# Patient Record
Sex: Male | Born: 1960 | Race: White | Hispanic: No | Marital: Married | Smoking: Former smoker
Health system: Southern US, Community
[De-identification: ages and names within clinical notes are randomized; demographics above are authoritative.]

## PROBLEM LIST (undated history)

## (undated) DIAGNOSIS — M47816 Spondylosis without myelopathy or radiculopathy, lumbar region: Secondary | ICD-10-CM

## (undated) DIAGNOSIS — U071 COVID-19: Secondary | ICD-10-CM

## (undated) DIAGNOSIS — E119 Type 2 diabetes mellitus without complications: Secondary | ICD-10-CM

## (undated) DIAGNOSIS — K5790 Diverticulosis of intestine, part unspecified, without perforation or abscess without bleeding: Secondary | ICD-10-CM

## (undated) DIAGNOSIS — IMO0002 Reserved for concepts with insufficient information to code with codable children: Secondary | ICD-10-CM

## (undated) DIAGNOSIS — I10 Essential (primary) hypertension: Secondary | ICD-10-CM

## (undated) HISTORY — DX: Essential (primary) hypertension: I10

## (undated) HISTORY — DX: Diverticulosis of intestine, part unspecified, without perforation or abscess without bleeding: K57.90

## (undated) HISTORY — PX: VASECTOMY: SHX75

---

## 2003-05-14 LAB — HM COLONOSCOPY

## 2007-11-04 HISTORY — PX: HERNIA REPAIR: SHX51

## 2008-05-04 ENCOUNTER — Emergency Department: Payer: Self-pay | Admitting: Emergency Medicine

## 2008-05-07 ENCOUNTER — Emergency Department: Payer: Self-pay | Admitting: Emergency Medicine

## 2009-12-19 ENCOUNTER — Emergency Department: Payer: Self-pay

## 2009-12-20 ENCOUNTER — Emergency Department: Payer: Self-pay | Admitting: Emergency Medicine

## 2011-04-09 ENCOUNTER — Ambulatory Visit: Payer: Self-pay | Admitting: Internal Medicine

## 2011-04-10 LAB — PSA

## 2011-10-13 ENCOUNTER — Emergency Department: Payer: Self-pay | Admitting: Emergency Medicine

## 2011-12-24 ENCOUNTER — Other Ambulatory Visit: Payer: Self-pay | Admitting: Internal Medicine

## 2011-12-24 LAB — COMPREHENSIVE METABOLIC PANEL
Albumin: 3.9 g/dL (ref 3.4–5.0)
Anion Gap: 10 (ref 7–16)
BUN: 18 mg/dL (ref 7–18)
Calcium, Total: 8.9 mg/dL (ref 8.5–10.1)
Chloride: 99 mmol/L (ref 98–107)
Co2: 30 mmol/L (ref 21–32)
Creatinine: 0.99 mg/dL (ref 0.60–1.30)
EGFR (African American): >60
EGFR (Non-African Amer.): >60
Glucose: 152 mg/dL — ABNORMAL HIGH (ref 65–99)
Osmolality: 282 (ref 275–301)
Potassium: 3.7 mmol/L (ref 3.5–5.1)
SGOT(AST): 27 U/L (ref 15–37)
SGPT (ALT): 39 U/L
Sodium: 139 mmol/L (ref 136–145)
Total Protein: 7.5 g/dL (ref 6.4–8.2)

## 2011-12-24 LAB — CBC WITH DIFFERENTIAL/PLATELET
Basophil %: 0.2 %
Eosinophil %: 1.4 %
HGB: 15.2 g/dL (ref 13.0–18.0)
Lymphocyte %: 31 %
MCH: 33 pg (ref 26.0–34.0)
MCHC: 34 g/dL (ref 32.0–36.0)
Monocyte #: 0.4 10*3/uL (ref 0.0–0.7)
Monocyte %: 6.8 %
Neutrophil #: 3.9 10*3/uL (ref 1.4–6.5)
Neutrophil %: 60.6 %
RBC: 4.6 10*6/uL (ref 4.40–5.90)
WBC: 6.4 10*3/uL (ref 3.8–10.6)

## 2011-12-24 LAB — LIPID PANEL
HDL Cholesterol: 30 mg/dL — ABNORMAL LOW (ref 40–60)
Ldl Cholesterol, Calc: 91 mg/dL (ref 0–100)
Triglycerides: 285 mg/dL — ABNORMAL HIGH (ref 0–200)
VLDL Cholesterol, Calc: 57 mg/dL — ABNORMAL HIGH (ref 5–40)

## 2011-12-24 LAB — HEMOGLOBIN A1C: Hemoglobin A1C: 6.9 % — ABNORMAL HIGH (ref 4.2–6.3)

## 2011-12-24 LAB — IRON AND TIBC
Iron Bind.Cap.(Total): 312 ug/dL (ref 250–450)
Iron Saturation: 27 %
Iron: 84 ug/dL (ref 65–175)
Unbound Iron-Bind.Cap.: 228 ug/dL

## 2011-12-24 LAB — FERRITIN: Ferritin (ARMC): 396 ng/mL — ABNORMAL HIGH (ref 8–388)

## 2012-02-07 ENCOUNTER — Inpatient Hospital Stay: Payer: Self-pay | Admitting: Internal Medicine

## 2012-02-07 LAB — CBC WITH DIFFERENTIAL/PLATELET
Basophil #: 0 10*3/uL (ref 0.0–0.1)
HCT: 45.6 % (ref 40.0–52.0)
MCH: 33.6 pg (ref 26.0–34.0)
MCHC: 34.4 g/dL (ref 32.0–36.0)
Monocyte %: 8.7 %
Platelet: 132 10*3/uL — ABNORMAL LOW (ref 150–440)
RDW: 13.7 % (ref 11.5–14.5)
WBC: 5.9 10*3/uL (ref 3.8–10.6)

## 2012-02-07 LAB — BASIC METABOLIC PANEL
Anion Gap: 8 (ref 7–16)
Calcium, Total: 9 mg/dL (ref 8.5–10.1)
Co2: 28 mmol/L (ref 21–32)
EGFR (African American): >60
EGFR (Non-African Amer.): >60
Glucose: 110 mg/dL — ABNORMAL HIGH (ref 65–99)
Osmolality: 280 (ref 275–301)
Potassium: 4.5 mmol/L (ref 3.5–5.1)

## 2012-02-13 LAB — CULTURE, BLOOD (SINGLE)

## 2012-02-14 LAB — CULTURE, BLOOD (SINGLE)

## 2012-03-30 ENCOUNTER — Emergency Department: Payer: Self-pay | Admitting: Emergency Medicine

## 2012-03-30 LAB — CBC
HCT: 45.6 % (ref 40.0–52.0)
HGB: 15.6 g/dL (ref 13.0–18.0)
MCH: 33.2 pg (ref 26.0–34.0)
MCHC: 34.2 g/dL (ref 32.0–36.0)
MCV: 97 fL (ref 80–100)
Platelet: 123 10*3/uL — ABNORMAL LOW (ref 150–440)
RDW: 14.2 % (ref 11.5–14.5)
WBC: 9.4 10*3/uL (ref 3.8–10.6)

## 2012-03-30 LAB — BASIC METABOLIC PANEL
Anion Gap: 9 (ref 7–16)
BUN: 16 mg/dL (ref 7–18)
Calcium, Total: 8.4 mg/dL — ABNORMAL LOW (ref 8.5–10.1)
Chloride: 101 mmol/L (ref 98–107)
Creatinine: 0.97 mg/dL (ref 0.60–1.30)
EGFR (African American): >60
Glucose: 186 mg/dL — ABNORMAL HIGH (ref 65–99)
Osmolality: 278 (ref 275–301)
Sodium: 136 mmol/L (ref 136–145)

## 2012-04-02 ENCOUNTER — Emergency Department: Payer: Self-pay | Admitting: Emergency Medicine

## 2012-05-13 ENCOUNTER — Telehealth: Payer: Self-pay | Admitting: Internal Medicine

## 2012-05-13 ENCOUNTER — Encounter: Payer: Self-pay | Admitting: Internal Medicine

## 2012-05-13 ENCOUNTER — Ambulatory Visit (INDEPENDENT_AMBULATORY_CARE_PROVIDER_SITE_OTHER): Payer: BC Managed Care – PPO | Admitting: Internal Medicine

## 2012-05-13 VITALS — BP 118/80 | HR 69 | Temp 98.1°F | Resp 18 | Ht 70.0 in | Wt 247.5 lb

## 2012-05-13 DIAGNOSIS — K5790 Diverticulosis of intestine, part unspecified, without perforation or abscess without bleeding: Secondary | ICD-10-CM | POA: Insufficient documentation

## 2012-05-13 DIAGNOSIS — G4733 Obstructive sleep apnea (adult) (pediatric): Secondary | ICD-10-CM

## 2012-05-13 DIAGNOSIS — I872 Venous insufficiency (chronic) (peripheral): Secondary | ICD-10-CM

## 2012-05-13 DIAGNOSIS — E669 Obesity, unspecified: Secondary | ICD-10-CM | POA: Insufficient documentation

## 2012-05-13 DIAGNOSIS — I10 Essential (primary) hypertension: Secondary | ICD-10-CM | POA: Insufficient documentation

## 2012-05-13 DIAGNOSIS — I878 Other specified disorders of veins: Secondary | ICD-10-CM

## 2012-05-13 NOTE — Assessment & Plan Note (Signed)
Suspected by exam and history.  Records requested.  Pathophysiology explained and Proper use of compression stockings described.

## 2012-05-13 NOTE — Assessment & Plan Note (Signed)
diagnosed with prior sleep study but treatment has been deferred d by patient.  Discussed the long term history of OSA , the risks of long term damage to heart and the signs and symptoms attributable to OSA.  Advised patient to consider  significant weight loss and/or use of CPAP.  

## 2012-05-13 NOTE — Telephone Encounter (Signed)
Letter has been printed.  Ready for patient to pick up.

## 2012-05-13 NOTE — Patient Instructions (Addendum)
Consider the Low Glycemic Index Diet and 6 smaller meals daily .  This boosts your metabolism and regulates your sugars:   7 AM Low carbohydrate Protein  Shakes (EAS AdvantEdge Carb Control  Or Atkins ,  Available everywhere,   In  cases at BJs )  2.5 carbs  (Add or substitute a toasted sandwhich thin w/ peanut butter) or consider scrambled eggs on a low carb tortilla)  10 AM: Protein bar by Atkins (snack size,  Chocolate lover's variety at  [employer]J's)    Lunch: sandwich on pita bread or flatbread (Joseph's makes a pita bread and a flat bread , available at [employer] and [employer]J's; Toufayah makes a low carb flatbread available at [employer] and [employer]) Mission makes a low carb whole wheat tortilla available at [employer] most grocery stores   3 PM:  Mid day :  Another protein bar,  Or a  cheese stick, 1/4 cup of almonds, walnuts, pistachios, pecans, peanuts,  Macadamia nuts  6 PM  Dinner:  "mean and green:"  Meat/chicken/fish, salad, and green veggie : use ranch, vinagrette,  Blue cheese, etc  9 PM snack : Breyer's low carb fudgsicle or  ice cream bar (Carb Smart), or  Weight Watcher's ice cream bar , or another protein shake  All substitutions should be less than 15 to 20 net carbohydrates per serving. Dannons lite n fit greek yogurt  Is 80 cal / 8 carbs

## 2012-05-13 NOTE — Telephone Encounter (Signed)
Needing a note for work for the time he was in the Dr's office today. He wants to pick it up after 2:00.

## 2012-05-13 NOTE — Assessment & Plan Note (Signed)
I have addressed  BMI and recommended a low glycemic index diet utilizing smaller more frequent meals to increase metabolism.  I have also recommended that patient start exercising with a goal of 30 minutes of aerobic exercise a minimum of 5 days per week. Screening for lipid disorders, thyroid and diabetes to be done today.   

## 2012-05-13 NOTE — Progress Notes (Signed)
Patient ID: Steven Holmes, male   DOB: 1961/10/21, 51 y.o.   MRN: [id number]  Patient Active Problem List  Diagnosis  . HTN (hypertension)  . OSA (obstructive sleep apnea)  . Diverticulosis  . Obesity (BMI 30-39.9)  . Lower extremity venous stasis    Subjective:  CC:   Chief Complaint  Patient presents with  . New Patient    HPI:   Steven Holmes is a 51 y.o. male who presents as a new patient to establish primary care with the chief complaint of  Recurrent RLE swelling and redness accompanied by pain.  Present for 5 or 6 years,   No history of significant trauma to right side.   No history blood clot or groin infection .  Symptoms are persistent but vary in severity with incomplete resolution in between episodes. Not sure what precipitates flares, which occur about once yearly until this years, with two episodes so far.  Treated in ER for cellulitis with antibiotics for MRSA, advised to elevate feet and use compression socks advised  which he has only recently started.   No history of ulcers or boils so no culture done .  Not wearing the stockings during rhe day. Has had prior vascular evaluation per patient ordered by Dr. Alison Murray. Test for sleep apnea was positive one year ago,  But he did not obtain CPAP.  Sleep study was done [facility] , ordered by Dr. Alison Murray.  2012.     Past Medical History  Diagnosis Date  . Hypertension   . Diverticulosis     Past Surgical History  Procedure Date  . Hernia repair 2009    revision left sided inguinal at [facility]  . Vasectomy     Family History  Problem Relation Age of Onset  . Cancer Mother     skin Ca  . Cancer Father     basal cell , nose     History   Social History  . Marital Status: Married    Spouse Name: N/A    Number of Children: N/A  . Years of Education: N/A   Occupational History  . Not on file.   Social History Main Topics  . Smoking status: Former Smoker -- 20 years    Types: Cigarettes    Quit date:  05/13/2010  . Smokeless tobacco: Never Used  . Alcohol Use: No  . Drug Use: No  . Sexually Active: Not on file   Other Topics Concern  . Not on file   Social History Narrative  . No narrative on file     No Known Allergies   Review of Systems:   The remainder of the review of systems was negative except those addressed in the HPI.     Objective:  BP 118/80  Pulse 69  Temp 98.1 F (36.7 C) (Oral)  Resp 18  Ht 5\' 10"  (1.778 m)  Wt 247 lb 8 oz (112.265 kg)  BMI 35.51 kg/m2  SpO2 96%  General appearance: alert, cooperative and appears stated age Ears: normal TM's and external ear canals both ears Throat: lips, mucosa, and tongue normal; teeth and gums normal Neck: no adenopathy, no carotid bruit, supple, symmetrical, trachea midline and thyroid not enlarged, symmetric, no tenderness/mass/nodules Back: symmetric, no curvature. ROM normal. No CVA tenderness. Lungs: clear to auscultation bilaterally Heart: regular rate and rhythm, S1, S2 normal, no murmur, click, rub or gallop Abdomen: soft, non-tender; bowel sounds normal; no masses,  no organomegaly Pulses: 2+ and symmetric Skin: Skin  color, texture, turgor normal. No rashes or lesions Lymph nodes: Cervical, supraclavicular, and axillary nodes normal.  Assessment and Plan:  OSA (obstructive sleep apnea) diagnosed with prior sleep study but treatment has been deferred d by patient.  Discussed the long term history of OSA , the risks of long term damage to heart and the signs and symptoms attributable to OSA.  Advised patient to consider  significant weight loss and/or use of CPAP.   Obesity (BMI 30-39.9) I have addressed  BMI and recommended a low glycemic index diet utilizing smaller more frequent meals to increase metabolism.  I have also recommended that patient start exercising with a goal of 30 minutes of aerobic exercise a minimum of 5 days per week. Screening for lipid disorders, thyroid and diabetes to be done  today.     HTN (hypertension) Well controlled on current regimen. Renal function check needed and ordered..  Lower extremity venous stasis Suspected by exam and history.  Records requested.  Pathophysiology explained and Proper use of compression stockings described.   Updated Medication List Outpatient Encounter Prescriptions as of 05/13/2012  Medication Sig Dispense Refill  . lisinopril-hydrochlorothiazide (PRINZIDE,ZESTORETIC) 10-12.5 MG per tablet Take 1 tablet by mouth daily.      . Multiple Vitamin (MULTIVITAMIN) tablet Take 1 tablet by mouth daily.         Orders Placed This Encounter  Procedures  . HM COLONOSCOPY    No Follow-up on file.

## 2012-05-13 NOTE — Assessment & Plan Note (Signed)
Well controlled on current regimen. Renal function check needed and ordered.Steven Holmes

## 2012-06-11 ENCOUNTER — Emergency Department: Payer: Self-pay | Admitting: *Deleted

## 2012-08-16 ENCOUNTER — Encounter: Payer: Self-pay | Admitting: Internal Medicine

## 2012-08-16 ENCOUNTER — Ambulatory Visit (INDEPENDENT_AMBULATORY_CARE_PROVIDER_SITE_OTHER): Payer: BC Managed Care – PPO | Admitting: Internal Medicine

## 2012-08-16 VITALS — BP 120/62 | HR 68 | Temp 98.2°F | Ht 69.25 in | Wt 249.5 lb

## 2012-08-16 DIAGNOSIS — R05 Cough: Secondary | ICD-10-CM

## 2012-08-16 DIAGNOSIS — E669 Obesity, unspecified: Secondary | ICD-10-CM

## 2012-08-16 DIAGNOSIS — R0600 Dyspnea, unspecified: Secondary | ICD-10-CM

## 2012-08-16 DIAGNOSIS — E119 Type 2 diabetes mellitus without complications: Secondary | ICD-10-CM

## 2012-08-16 DIAGNOSIS — R0989 Other specified symptoms and signs involving the circulatory and respiratory systems: Secondary | ICD-10-CM

## 2012-08-16 MED ORDER — ALBUTEROL SULFATE HFA 108 (90 BASE) MCG/ACT IN AERS: 2.0000 | INHALATION_SPRAY | Freq: Four times a day (QID) | RESPIRATORY_TRACT | Status: DC | PRN

## 2012-08-16 NOTE — Progress Notes (Signed)
Patient ID: Steven Holmes, male   DOB: 07-28-1961, 51 y.o.   MRN: [id number]  Patient Active Problem List  Diagnosis  . HTN (hypertension)  . OSA (obstructive sleep apnea)  . Diverticulosis  . Obesity (BMI 30-39.9)  . Lower extremity venous stasis  . Dyspnea    Subjective:  CC:   Chief Complaint  Patient presents with  . Follow-up    HPI:   Steven Holmes a 51 y.o. male who presents Obesity,  Dyspnea,  LE edema  Having trouble exercising  due to to dyspnea,  Quit smoking in 2002 after 25 pack yr history .  Last PFTS in the 90's done for same reason  As well as occupationla exposure to silica and dust.   Needs repeat along with cxr. Also having cough,  Mixed dry/clear . Wakes up with productive cough.   Has many conditions that have not had follow up including DM and HH.  History of pneumonia in 1994 and in 2000.   No chest  X ray in one year.     Past Medical History  Diagnosis Date  . Hypertension   . Diverticulosis     Past Surgical History  Procedure Date  . Hernia repair 2009    revision left sided inguinal at [facility]  . Vasectomy          The following portions of the patient's history were reviewed and updated as appropriate: Allergies, current medications, and problem list.    Review of Systems:   12 Pt  review of systems was negative except those addressed in the HPI,     History   Social History  . Marital Status: Married    Spouse Name: N/A    Number of Children: N/A  . Years of Education: N/A   Occupational History  . Not on file.   Social History Main Topics  . Smoking status: Former Smoker -- 20 years    Types: Cigarettes    Quit date: 05/13/2010  . Smokeless tobacco: Never Used  . Alcohol Use: No  . Drug Use: No  . Sexually Active: Not on file   Other Topics Concern  . Not on file   Social History Narrative  . No narrative on file    Objective:  BP 120/62  Pulse 68  Temp 98.2 F (36.8 C) (Oral)  Ht 5' 9.25" (1.759 m)   Wt 249 lb 8 oz (113.172 kg)  BMI 36.58 kg/m2  SpO2 94%  General appearance: alert, cooperative and appears stated age Ears: normal TM's and external ear canals both ears Throat: lips, mucosa, and tongue normal; teeth and gums normal Neck: no adenopathy, no carotid bruit, supple, symmetrical, trachea midline and thyroid not enlarged, symmetric, no tenderness/mass/nodules Back: symmetric, no curvature. ROM normal. No CVA tenderness. Lungs: clear to auscultation bilaterally Heart: regular rate and rhythm, S1, S2 normal, no murmur, click, rub or gallop Abdomen: soft, non-tender; bowel sounds normal; no masses,  no organomegaly Pulses: 2+ and symmetric Skin: Skin color, texture, turgor normal. No rashes or lesions Lymph nodes: Cervical, supraclavicular, and axillary nodes normal.  Assessment and Plan:  Dyspnea Likely multifactorial including restrictive lung disease from obesity and occupation exposure to inhaled irritants.   Will need CXR, PFTs and pulmonary evaluation .  Obesity (BMI 30-39.9) I have addressed  BMI and recommended a low glycemic index diet utilizing smaller more frequent meals to increase metabolism.  I have also recommended that patient start exercising with a goal of 30 minutes of  aerobic exercise a minimum of 5 days per week.    Updated Medication List Outpatient Encounter Prescriptions as of 08/16/2012  Medication Sig Dispense Refill  . lisinopril-hydrochlorothiazide (PRINZIDE,ZESTORETIC) 10-12.5 MG per tablet Take 1 tablet by mouth daily.      . Multiple Vitamin (MULTIVITAMIN) tablet Take 1 tablet by mouth daily.      Marland Kitchen albuterol (PROVENTIL HFA;VENTOLIN HFA) 108 (90 BASE) MCG/ACT inhaler Inhale 2 puffs into the lungs every 6 (six) hours as needed for wheezing.  1 Inhaler  0     Orders Placed This Encounter  Procedures  . DG Chest 2 View  . Hemoglobin A1c  . Comprehensive metabolic panel  . LDL cholesterol, direct  . CBC with Differential  . Iron and TIBC  .  Ferritin  . Ambulatory referral to Pulmonology  . Pulmonary function test    Return in about 1 month (around 09/16/2012).

## 2012-08-16 NOTE — Patient Instructions (Addendum)
Referral for chest x ray, pulmonary function testing and pulmonology evaluation  Underway:  We willl call you with the referral appts.  This is  Dr. Norton Blizzard version of a  "Low GI"  Diet:  All of the foods can be found at grocery stores and in bulk at [employer].  The Atkins protein bars and shakes are available in more varieties at [employer], [employer] and [employer].     7 AM Breakfast:  Low carbohydrate Protein  Shakes (I recommend the EAS AdvantEdge "Carb Control" shakes  Or the low carb shakes by Atkins.   Both are available everywhere:  In  cases at BJs  Or in 4 packs at grocery stores and pharmacies  2.5 carbs  (Alternative is  a toasted Arnold's Sandwhich Thin w/ peanut butter, a "Bagel Thin" with cream cheese and salmon) or  a scrambled egg burrito made with a low carb tortilla .  Avoid cereal and bananas, oatmeal too unless you are cooking the old fashioned kind that takes 30-40 minutes to prepare.  the rest is overly processed, has minimal fiber, and is loaded with carbohydrates!   10 AM: Protein bar by Atkins (the snack size, under 200 cal).  There are many varieties , available widely again or in bulk in limited varieties at BJs)  Other so called "protein bars" tend to be loaded with carbohydrates.  Remember, in food advertising, the word "energy" is synonymous for " carbohydrate."  Lunch: sandwich of [place], (or any lunchmeat, grilled meat or canned tuna), fresh avocado, mayonnaise  and cheese on a lower carbohydrate pita bread, flatbread, or tortilla . Ok to use regular mayonnaise. The bread is the only source or carbohydrate that can be decreased (Joseph's makes a pita bread and a flat bread that are 50 cal and 4 net carbs ; Toufayan makes a low carb flatbread that's 100 cal and 9 net carbs  and  Mission makes a low carb whole wheat tortilla  That is 210 cal and 6 net carbs)  3 PM:  Mid day :  Another protein bar,  Or a  cheese stick (100 cal, 0 carbs),  Or 1 ounce of  almonds, walnuts,  pistachios, pecans, peanuts,  Macadamia nuts. Or a Dannon light n Fit greek yogurt, 80 cal 8 net carbs . Avoid "granola"; the dried cranberries and raisins are loaded with carbohydrates. Mixed nuts ok if no raisins or cranberries or dried fruit.      6 PM  Dinner:  "mean and green:"  Meat/chicken/fish or a high protein legume; , with a green salad, and a low GI  Veggie (broccoli, cauliflower, green beans, spinach, brussel sprouts. Lima beans) : Avoid "Low fat dressings, as well as Reyne Dumas and [address]! They are loaded with sugar! Instead use ranch, vinagrette,  Blue cheese, etc  9 PM snack : Breyer's "low carb" fudgsicle or  ice cream bar (Carb Smart line), or  Weight Watcher's ice cream bar , or another "no sugar added" ice cream;a serving of fresh berries/cherries with whipped cream (Avoid bananas, pineapple, grapes  and watermelon on a regular basis because they are high in sugar)   Remember that snack Substitutions should be less than 15 to 20 carbs  Per serving. Remember to subtract fiber grams and sugar alcohols to get the "net carbs."

## 2012-08-17 ENCOUNTER — Encounter: Payer: Self-pay | Admitting: Internal Medicine

## 2012-08-17 DIAGNOSIS — R0600 Dyspnea, unspecified: Secondary | ICD-10-CM | POA: Insufficient documentation

## 2012-08-17 LAB — CBC WITH DIFFERENTIAL/PLATELET
Basophils Relative: 0.2 % (ref 0.0–3.0)
Eosinophils Absolute: 0.1 10*3/uL (ref 0.0–0.7)
HCT: 47 % (ref 39.0–52.0)
Hemoglobin: 15.7 g/dL (ref 13.0–17.0)
Lymphs Abs: 2.1 10*3/uL (ref 0.7–4.0)
MCHC: 33.3 g/dL (ref 30.0–36.0)
MCV: 101 fl — ABNORMAL HIGH (ref 78.0–100.0)
Monocytes Absolute: 0.5 10*3/uL (ref 0.1–1.0)
Neutro Abs: 4.1 10*3/uL (ref 1.4–7.7)
RBC: 4.65 Mil/uL (ref 4.22–5.81)

## 2012-08-17 LAB — COMPREHENSIVE METABOLIC PANEL
ALT: 30 U/L (ref 0–53)
AST: 24 U/L (ref 0–37)
Creatinine, Ser: 0.9 mg/dL (ref 0.4–1.5)
Sodium: 137 mEq/L (ref 135–145)
Total Bilirubin: 0.8 mg/dL (ref 0.3–1.2)

## 2012-08-17 LAB — HEMOGLOBIN A1C: Hgb A1c MFr Bld: 6.6 % — ABNORMAL HIGH (ref 4.6–6.5)

## 2012-08-17 NOTE — Assessment & Plan Note (Signed)
I have addressed  BMI and recommended a low glycemic index diet utilizing smaller more frequent meals to increase metabolism.  I have also recommended that patient start exercising with a goal of 30 minutes of aerobic exercise a minimum of 5 days per week.  

## 2012-08-17 NOTE — Assessment & Plan Note (Signed)
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   Will need CXR, PFTs and pulmonary evaluation .

## 2012-08-19 NOTE — Addendum Note (Signed)
Addended by: Sherlene Shams on: 08/19/2012 06:11 PM   Modules accepted: Orders

## 2012-08-20 ENCOUNTER — Other Ambulatory Visit: Payer: BC Managed Care – PPO

## 2012-08-23 LAB — IRON AND TIBC
Iron Saturation: 27 % (ref 15–55)
Iron: 74 ug/dL (ref 40–155)

## 2012-08-24 ENCOUNTER — Ambulatory Visit: Payer: Self-pay | Admitting: Internal Medicine

## 2012-09-02 ENCOUNTER — Ambulatory Visit (INDEPENDENT_AMBULATORY_CARE_PROVIDER_SITE_OTHER): Payer: BC Managed Care – PPO | Admitting: Pulmonary Disease

## 2012-09-02 ENCOUNTER — Encounter: Payer: Self-pay | Admitting: Pulmonary Disease

## 2012-09-02 VITALS — BP 114/70 | HR 80 | Temp 97.9°F | Ht 70.0 in | Wt 248.0 lb

## 2012-09-02 DIAGNOSIS — R05 Cough: Secondary | ICD-10-CM

## 2012-09-02 DIAGNOSIS — G4733 Obstructive sleep apnea (adult) (pediatric): Secondary | ICD-10-CM

## 2012-09-02 DIAGNOSIS — R0989 Other specified symptoms and signs involving the circulatory and respiratory systems: Secondary | ICD-10-CM

## 2012-09-02 DIAGNOSIS — R059 Cough, unspecified: Secondary | ICD-10-CM

## 2012-09-02 DIAGNOSIS — R0609 Other forms of dyspnea: Secondary | ICD-10-CM

## 2012-09-02 DIAGNOSIS — R0600 Dyspnea, unspecified: Secondary | ICD-10-CM

## 2012-09-02 NOTE — Assessment & Plan Note (Signed)
Steven Holmes has a lengthy history of smoking but has no evidence of COPD on his PFT's.  They showed mild restriction with a low ERV which is either due to poor effort or obesity.  I think that obesity is likely at play here.  We will get a CXR to ensure there is no other evidence of lung disease but I doubt there will be given his normal exam and oximetry today.  Anemia is also not a possibility given his normal Hgb.  His muscle strength is normal on my neuro exam today in clinic.  I think we need to rule out cardiac disease given his age and prior smoking history.  I will order an stress echo so we can also get a look at his heart function.  If that is negative then we will be left with obesity and deconditioning as the most likely etiology of his illness.  Plan: -CXR -stress Echo -if echo negative, start diet and exercise routine; f/u in 3 months if improved

## 2012-09-02 NOTE — Assessment & Plan Note (Signed)
He has a mild intermittent cough which is likely due to sinus congestion as he describes significant post nasal drip especially at night an in the mornings.  His GERD is well controlled and he is on an ACE inhibitor, so if treatment of post nasal drip doesn't work we can investigate these further.  Plan: -saline rinses, nasonex, chlortimeton and OTC decongestants

## 2012-09-02 NOTE — Assessment & Plan Note (Signed)
He describes classic OSA symptoms but did not want to pay for CPAP after a recent positive study.  I explained to him the risks of not treating OSA (A-fib, stroke, hypertension, etc).  He is willing to consider treatment of CPAP if his sleep study showed moderate or severe disease.  Plan: -I'll get the records of his recent polysomnogram and review with him

## 2012-09-02 NOTE — Progress Notes (Signed)
Subjective:    Patient ID: Steven Holmes, male    DOB: 10/09/1961, 51 y.o.   MRN: [id number]  HPI Steven Holmes is a 51 year old male who has a past medical history significant for smoking one pack of cigarettes daily for 25 years who comes to our clinic today for evaluation of shortness of breath. He had a normal childhood without respiratory illnesses but unfortunately started smoking and young adulthood. He quit smoking in 2002.  Several years ago he noticed shortness of breath with exertion while at work. In January of 2013 he had bronchitis and states that since then he is still had intermittent wheezing, shortness of breath, and coughing. This does not occur all the time but he will sometimes experience coughing with mild sputum production in the morning. He says that walking makes him short of breath if walking more than 100 yards or so and he states that climbing flights of stairs will make him short of breath as well. He says that he can make it all the way through grocery store and can carrying groceries without too much difficulty.  He denies chest pain or leg swelling that he has never had a cardiac workup of any kind to his knowledge.  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   He has fairly classic symptoms of obstructive sleep apnea in that he wakes up not feeling well rested and he is often short of breath in the afternoons. He underwent a sleep study which showed signs of obstructive sleep apnea but he did not want to pay for CPAP.   He lives in a house that has a basement and he believes has moisture and mildew problems. He does not notice increasing shortness of breath when he is in his house as opposed to being somewhere else but  sometimes he has worsening runny nose when he is in his kitchen. He does not have significant acid reflux symptoms and he says that he has very rare sinus symptoms.    Past Medical History  Diagnosis Date  . Hypertension   . Diverticulosis      Family History  Problem Relation Age of Onset  . Cancer Mother     skin Ca  . Cancer Father     basal cell , nose      History   Social History  . Marital Status: Married    Spouse Name: N/A    Number of Children: N/A  . Years of Education: N/A   Occupational History  . Not on file.   Social History Main Topics  . Smoking status: Former Smoker -- 1.0 packs/day for 25 years    Types: Cigarettes    Quit date: 11/03/2000  . Smokeless tobacco: Never Used  . Alcohol Use: No  . Drug Use: No  . Sexually Active: Not on file   Other Topics Concern  . Not on file   Social History Narrative  . No narrative on file     No Known Allergies   Outpatient Prescriptions Prior to Visit  Medication Sig Dispense Refill  . albuterol (PROVENTIL HFA;VENTOLIN HFA) 108 (90 BASE) MCG/ACT inhaler Inhale 2 puffs into the lungs every 6 (six) hours as needed for wheezing.  1 Inhaler  0  . lisinopril-hydrochlorothiazide (PRINZIDE,ZESTORETIC) 10-12.5 MG per tablet Take 1 tablet by mouth daily.      . Multiple Vitamin (MULTIVITAMIN) tablet Take 1 tablet by mouth daily.       Last reviewed on 09/02/2012  4:00 PM by Christen Butter, CMA    Review of Systems  Constitutional: Negative for fever, chills, activity change and appetite change.  HENT: Positive for rhinorrhea, sneezing and postnasal drip. Negative for hearing loss, ear pain, congestion, neck pain, neck stiffness and sinus pressure.   Eyes: Negative for redness, itching and visual disturbance.  Respiratory: Positive for cough and shortness of breath. Negative for chest tightness and wheezing.   Cardiovascular: Positive for leg swelling. Negative for chest pain and palpitations.    Gastrointestinal: Negative for nausea, vomiting, abdominal pain, diarrhea, constipation, blood in stool and abdominal distention.  Musculoskeletal: Negative for myalgias, joint swelling, arthralgias and gait problem.  Skin: Negative for rash.  Neurological: Negative for dizziness, light-headedness, numbness and headaches.  Hematological: Does not bruise/bleed easily.  Psychiatric/Behavioral: Negative for confusion and dysphoric mood.       Objective:   Physical Exam  Filed Vitals:   09/02/12 1601  BP: 114/70  Pulse: 80  Temp: 97.9 F (36.6 C)  TempSrc: Oral  Height: 5\' 10"  (1.778 m)  Weight: 248 lb (112.492 kg)  SpO2: 97%   Gen: obese, well appearing, no acute distress HEENT: NCAT, PERRL, EOMi, OP clear, neck supple without masses PULM: CTA B CV: RRR, no mgr, no JVD AB: BS+, soft, nontender, no hsm Ext: warm,some ankle edema bilaterally, no clubbing, no cyanosis Derm: no rash or skin breakdown Neuro: A&Ox4, CN II-XII intact, strength 5/5 in all 4 extremities  08/2012 Full PFT [facility]) normal spiro, mild to moderate restriction with low ERV and normal DLCO     Assessment & Plan:   Dyspnea [name] has a lengthy history of smoking but has no evidence of COPD on his PFT's.  They showed mild restriction with a low ERV which is either due to poor effort or obesity.  I think that obesity is likely at play here.  We will get a CXR to ensure there is no other evidence of lung disease but I doubt there will be given his normal exam and oximetry today.  Anemia is also not a possibility given his normal Hgb.  His muscle strength is normal on my neuro exam today in clinic.  I think we need to rule out cardiac disease given his age and prior smoking history.  I will order an stress echo so we can also get a look at his heart function.  If that is negative then we will be left with obesity and deconditioning as the most likely etiology of his illness.  Plan: -CXR -stress Echo -if  echo negative, start diet and exercise routine; f/u in 3 months if improved  Cough He has a mild intermittent cough which is likely due to sinus congestion as he describes significant post nasal drip especially at night an in the mornings.  His GERD is well controlled and he is on an ACE inhibitor, so if treatment of post nasal drip doesn't work we can investigate these further.  Plan: -saline rinses, nasonex, chlortimeton and OTC decongestants  OSA (obstructive sleep apnea) He describes classic OSA symptoms but did not want to pay for CPAP after a recent positive study.  I explained to him the risks of not treating OSA (A-fib, stroke, hypertension, etc).  He is willing to consider treatment of CPAP if his sleep study showed moderate or severe disease.  Plan: -I'll get the records of his recent polysomnogram and review with him   Updated Medication List Outpatient Encounter Prescriptions as of 09/02/2012  Medication Sig Dispense Refill  . albuterol (PROVENTIL HFA;VENTOLIN HFA) 108 (90 BASE) MCG/ACT inhaler Inhale 2 puffs into the lungs every 6 (six) hours as needed for wheezing.  1 Inhaler  0  . lisinopril-hydrochlorothiazide (PRINZIDE,ZESTORETIC) 10-12.5 MG per tablet Take 1 tablet by mouth daily.      . Multiple Vitamin (MULTIVITAMIN) tablet Take 1 tablet by mouth daily.

## 2012-09-02 NOTE — Patient Instructions (Signed)
We will send you for a Chest x-ray for your cough and call you with the results.  For the cough, we need to focus our care on your sinus congestion:  Use Neil Med rinses with distilled water at least twice per day using the instructions on the package. 1/2 hour after using the [facility] Med rinse, use Nasonex two puffs in each nostril once per day. Use chlortrimeton and an over the counter decongestant (pseudophed or phenylephrine) as needed for the cough.  We will send you for a stress test with Dr. Windell Hummingbird office to evaluate your shortness of breath.  We will call you with the results of these tests.  If they are both negative, then you should start exercising regularly with a goal to walk 35 minutes daily.  Try to lose 20lbs with diet and exercise.  We will see you back in two months or sooner if needed

## 2012-09-07 ENCOUNTER — Encounter: Payer: Self-pay | Admitting: Pulmonary Disease

## 2012-09-08 ENCOUNTER — Telehealth: Payer: Self-pay | Admitting: *Deleted

## 2012-09-08 NOTE — Telephone Encounter (Signed)
Message copied by Christen Butter on Wed Sep 08, 2012  9:22 AM ------      Message from: Max Fickle B      Created: Tue Sep 07, 2012  5:52 PM       L,            Can we call this guy and let him know I reviewed his sleep study.  He has significant sleep apnea and he should be on CPAP.              From the study:      CPAP titrated to 13 cm of water with C-Flex of 3. Quadro fullface mask size medium used            Thanks,      B

## 2012-09-08 NOTE — Telephone Encounter (Signed)
Called the pt, NA and unable to leave a msg at this time, [facility]

## 2012-09-09 ENCOUNTER — Other Ambulatory Visit (HOSPITAL_COMMUNITY): Payer: BC Managed Care – PPO

## 2012-09-10 NOTE — Telephone Encounter (Signed)
ATC the pt, NA and no option to leave a msg, WCB 

## 2012-09-13 ENCOUNTER — Other Ambulatory Visit (HOSPITAL_COMMUNITY): Payer: Self-pay | Admitting: Pulmonary Disease

## 2012-09-13 DIAGNOSIS — R0602 Shortness of breath: Secondary | ICD-10-CM

## 2012-09-15 ENCOUNTER — Other Ambulatory Visit (HOSPITAL_COMMUNITY): Payer: BC Managed Care – PPO

## 2012-09-15 ENCOUNTER — Encounter: Payer: Self-pay | Admitting: *Deleted

## 2012-09-15 NOTE — Telephone Encounter (Signed)
ATC the pt again, still unable to reach him, so I mailed him a letter to call for results asap.

## 2012-09-20 ENCOUNTER — Telehealth: Payer: Self-pay | Admitting: Pulmonary Disease

## 2012-09-20 ENCOUNTER — Ambulatory Visit (HOSPITAL_COMMUNITY): Payer: BC Managed Care – PPO | Attending: Cardiology

## 2012-09-20 DIAGNOSIS — R0989 Other specified symptoms and signs involving the circulatory and respiratory systems: Secondary | ICD-10-CM | POA: Insufficient documentation

## 2012-09-20 DIAGNOSIS — E669 Obesity, unspecified: Secondary | ICD-10-CM | POA: Insufficient documentation

## 2012-09-20 DIAGNOSIS — R0602 Shortness of breath: Secondary | ICD-10-CM

## 2012-09-20 DIAGNOSIS — R0609 Other forms of dyspnea: Secondary | ICD-10-CM

## 2012-09-20 DIAGNOSIS — Z87891 Personal history of nicotine dependence: Secondary | ICD-10-CM | POA: Insufficient documentation

## 2012-09-20 NOTE — Progress Notes (Signed)
Echocardiogram performed.  

## 2012-09-20 NOTE — Telephone Encounter (Signed)
I finally spoke with the pt Notified of results/recs per Dr. Kendrick Fries He states that due to financial issues at this time, wants to hold off on CPAP tx Will forward to Dr. Kendrick Fries so that he is aware

## 2012-09-20 NOTE — Telephone Encounter (Signed)
Message from: Lupita Leash  Created: Tue Sep 07, 2012 5:52 PM  L,  Can we call this guy and let him know I reviewed his sleep study. He has significant sleep apnea and he should be on CPAP.  From the study:  CPAP titrated to 13 cm of water with C-Flex of 3. Quadro fullface mask size medium used   LMTCB

## 2012-09-21 ENCOUNTER — Encounter: Payer: Self-pay | Admitting: Internal Medicine

## 2012-09-21 ENCOUNTER — Ambulatory Visit (INDEPENDENT_AMBULATORY_CARE_PROVIDER_SITE_OTHER): Payer: BC Managed Care – PPO | Admitting: Internal Medicine

## 2012-09-21 VITALS — BP 112/64 | HR 81 | Temp 97.9°F | Resp 12 | Ht 70.0 in | Wt 254.2 lb

## 2012-09-21 DIAGNOSIS — G4733 Obstructive sleep apnea (adult) (pediatric): Secondary | ICD-10-CM

## 2012-09-21 DIAGNOSIS — E669 Obesity, unspecified: Secondary | ICD-10-CM

## 2012-09-21 DIAGNOSIS — R0600 Dyspnea, unspecified: Secondary | ICD-10-CM

## 2012-09-21 DIAGNOSIS — J310 Chronic rhinitis: Secondary | ICD-10-CM

## 2012-09-21 DIAGNOSIS — R0609 Other forms of dyspnea: Secondary | ICD-10-CM

## 2012-09-21 NOTE — Patient Instructions (Addendum)
claritin (loratidine ) 10 mg daily  Zyrtec (cetiriZine)  10 mg daily    Allegra (fexofenadine) 60 mg or 180 mg daily.   This is  my version of a  "Low GI"  Diet:  All of the foods can be found at grocery stores and in bulk at [employer].  The Atkins protein bars and shakes are available in more varieties at Target, [employer] and [employer].     7 AM Breakfast:  Low carbohydrate Protein  Shakes (I recommend the EAS AdvantEdge "Carb Control" shakes  Or the low carb shakes by Atkins.   Both are available everywhere:  In  cases at BJs  Or in 4 packs at grocery stores and pharmacies  2.5 carbs  (Alternative is  a toasted Arnold's Sandwhich Thin w/ peanut butter, a "Bagel Thin" with cream cheese and salmon) or  a scrambled egg burrito made with a low carb tortilla .  Avoid cereal and bananas, oatmeal too unless you are cooking the old fashioned kind that takes 30-40 minutes to prepare.  the rest is overly processed, has minimal fiber, and is loaded with carbohydrates!   10 AM: Protein bar by Atkins (the snack size, under 200 cal).  There are many varieties , available widely again or in bulk in limited varieties at BJs)  Other so called "protein bars" tend to be loaded with carbohydrates.  Remember, in food advertising, the word "energy" is synonymous for " carbohydrate."  Lunch: sandwich of [place], (or any lunchmeat, grilled meat or canned tuna), fresh avocado, mayonnaise  and cheese on a lower carbohydrate pita bread, flatbread, or tortilla . Ok to use regular mayonnaise. The bread is the only source or carbohydrate that can be decreased (Joseph's makes a pita bread and a flat bread that are 50 cal and 4 net carbs ; Toufayan makes a low carb flatbread that's 100 cal and 9 net carbs  and  Mission makes a low carb whole wheat tortilla  That is 210 cal and 6 net carbs)  3 PM:  Mid day :  Another protein bar,  Or a  cheese stick (100 cal, 0 carbs),  Or 1 ounce of  almonds, walnuts, pistachios, pecans,  peanuts,  Macadamia nuts. Or a Dannon light n Fit greek yogurt, 80 cal 8 net carbs . Avoid "granola"; the dried cranberries and raisins are loaded with carbohydrates. Mixed nuts ok if no raisins or cranberries or dried fruit.      6 PM  Dinner:  "mean and green:"  Meat/chicken/fish or a high protein legume; , with a green salad, and a low GI  Veggie (broccoli, cauliflower, green beans, spinach, brussel sprouts. Lima beans) : Avoid "Low fat dressings, as well as Reyne Dumas and [address]! They are loaded with sugar! Instead use ranch, vinagrette,  Blue cheese, etc.  There is a low carb pasta by Dreamfield's available at [employer] [facility] that is acceptable and tastes great. Try Michel Angel's chicken piccata over low carb pasta. The chicken dish is 0 carbs, and can be found in frozen section at BJs and [employer]. Also try [employer] "Carnitas" (pulled pork, no sauce,  0 carbs) and his pot roast.   both are in the refrigerated section at BJs   9 PM snack : Breyer's "low carb" fudgsicle or  ice cream bar (Carb Smart line), or  Weight Watcher's ice cream bar , or another "no sugar added" ice cream;a serving of fresh berries/cherries with whipped cream (Avoid bananas, pineapple,  grapes  and watermelon on a regular basis because they are high in sugar)   Remember that snack Substitutions should be less than 15 to 20 carbs  Per serving. Remember to subtract fiber grams and sugar alcohols to get the "net carbs."

## 2012-09-21 NOTE — Progress Notes (Signed)
Patient ID: Steven Holmes, male   DOB: [birth year], 51 y.o.   MRN: [id number]  Patient Active Problem List  Diagnosis  . HTN (hypertension)  . OSA (obstructive sleep apnea)  . Diverticulosis  . Obesity (BMI 30-39.9)  . Lower extremity venous stasis  . Dyspnea  . Rhinitis    Subjective:  CC:   Chief Complaint  Patient presents with  . Follow-up    HPI:   Steven Holmes a 51 y.o. male who presents Followup on dyspnea. Patient has undergone pulmonary and Cardiologic evaluation in the last several weeks. He is pulmonary function tests indicated restrictive lung disease. He has untreated sleep apnea and a BMI of 36. His stress echo was done yesterday and the report is normal. He is not currently exercising or following any type of diet with the intent to lose weight.    2) runny nose .  Chronic since January.  Random. Occurs in and outside of the house. Coughing.  Post nasal drip.  Sneezing, eyes water or itching. Has not tried any over-the-counter antihistamines. No prior allergy testing.   Past Medical History  Diagnosis Date  . Hypertension   . Diverticulosis     Past Surgical History  Procedure Date  . Hernia repair 2009    revision left sided inguinal at [facility]  . Vasectomy          The following portions of the patient's history were reviewed and updated as appropriate: Allergies, current medications, and problem list.    Review of Systems:   12 Pt  review of systems was negative except those addressed in the HPI,     History   Social History  . Marital Status: Married    Spouse Name: N/A    Number of Children: N/A  . Years of Education: N/A   Occupational History  . Not on file.   Social History Main Topics  . Smoking status: Former Smoker -- 1.0 packs/day for 25 years    Types: Cigarettes    Quit date: 11/03/2000  . Smokeless tobacco: Never Used  . Alcohol Use: No  . Drug Use: No  . Sexually Active: Not on file   Other Topics Concern  . Not  on file   Social History Narrative  . No narrative on file    Objective:  BP 112/64  Pulse 81  Temp 97.9 F (36.6 C) (Oral)  Resp 12  Ht 5\' 10"  (1.778 m)  Wt 254 lb 4 oz (115.327 kg)  BMI 36.48 kg/m2  SpO2 95%  General appearance: alert, cooperative and appears stated age Ears: normal TM's and external ear canals both ears Throat: lips, mucosa, and tongue normal; teeth and gums normal Neck: no adenopathy, no carotid bruit, supple, symmetrical, trachea midline and thyroid not enlarged, symmetric, no tenderness/mass/nodules Back: symmetric, no curvature. ROM normal. No CVA tenderness. Lungs: clear to auscultation bilaterally Heart: regular rate and rhythm, S1, S2 normal, no murmur, click, rub or gallop Abdomen: soft, non-tender; bowel sounds normal; no masses,  no organomegaly Pulses: 2+ and symmetric Skin: Skin color, texture, turgor normal. No rashes or lesions Lymph nodes: Cervical, supraclavicular, and axillary nodes normal.  Assessment and Plan:  Obesity (BMI 30-39.9) I have addressed  BMI and recommended a low glycemic index diet utilizing smaller more frequent meals to increase metabolism.  I have also recommended that patient start exercising with a goal of 30 minutes of aerobic exercise a minimum of 5 days per week. Screening for lipid disorders, thyroid and  diabetes to be done today.    Dyspnea I have discussed the source of his dyspnea after reviewing Dr. Ulyses Jarred note and comment on his PFTs. His dyspnea appears to be due to restrictive physiology secondary to abdominal obesity. He also has obstructive sleep apnea which is not treating despite recommendations from Dr. Kendrick Fries. I've again emphasized the long-term complications of untreated sleep apnea which include pulmonary hypertension right heart failure and increased risk of sudden death. His stress echo was completely normal. He has been given the green light to start a daily exercise program.  OSA (obstructive  sleep apnea) Despite recommendations from Dr. Kendrick Fries and myself he will not consider another trial since he states he could not breathe with the CPAP machine that was used.  Rhinitis Is not clear whether her symptoms are due to allergic rhinitis or vasomotor rhinitis. He has been given the names of all the available over-the-counter generic antihistamines to try sequentially. If these are unsuccessful in controlling his rhinitis we will discuss referral for allergy testing.   Updated Medication List Outpatient Encounter Prescriptions as of 09/21/2012  Medication Sig Dispense Refill  . albuterol (PROVENTIL HFA;VENTOLIN HFA) 108 (90 BASE) MCG/ACT inhaler Inhale 2 puffs into the lungs every 6 (six) hours as needed for wheezing.  1 Inhaler  0  . lisinopril-hydrochlorothiazide (PRINZIDE,ZESTORETIC) 10-12.5 MG per tablet Take 1 tablet by mouth daily.      . Multiple Vitamin (MULTIVITAMIN) tablet Take 1 tablet by mouth daily.         No orders of the defined types were placed in this encounter.    Return in about 3 months (around 12/22/2012).

## 2012-09-22 NOTE — Progress Notes (Signed)
Quick Note:  Spoke with pt and notified of results per Dr. McQuaid. Pt verbalized understanding and denied any questions.  ______ 

## 2012-09-23 ENCOUNTER — Encounter: Payer: Self-pay | Admitting: Internal Medicine

## 2012-09-23 DIAGNOSIS — J310 Chronic rhinitis: Secondary | ICD-10-CM | POA: Insufficient documentation

## 2012-09-23 NOTE — Assessment & Plan Note (Signed)
I have addressed  BMI and recommended a low glycemic index diet utilizing smaller more frequent meals to increase metabolism.  I have also recommended that patient start exercising with a goal of 30 minutes of aerobic exercise a minimum of 5 days per week. Screening for lipid disorders, thyroid and diabetes to be done today.   

## 2012-09-23 NOTE — Assessment & Plan Note (Addendum)
Is not clear whether her symptoms are due to allergic rhinitis or vasomotor rhinitis. He has been given the names of all the available over-the-counter generic antihistamines to try sequentially. If these are unsuccessful in controlling his rhinitis we will discuss referral for allergy testing.

## 2012-09-23 NOTE — Assessment & Plan Note (Signed)
I have discussed the source of his dyspnea after reviewing Dr. Ulyses Jarred note and comment on his PFTs. His dyspnea appears to be due to restrictive physiology secondary to abdominal obesity. He also has obstructive sleep apnea which is not treating despite recommendations from Dr. Kendrick Fries. I've again emphasized the long-term complications of untreated sleep apnea which include pulmonary hypertension right heart failure and increased risk of sudden death. His stress echo was completely normal. He has been given the green light to start a daily exercise program.

## 2012-09-23 NOTE — Assessment & Plan Note (Signed)
Despite recommendations from Dr. Kendrick Fries and myself he will not consider another trial since he states he could not breathe with the CPAP machine that was used.

## 2012-12-06 ENCOUNTER — Other Ambulatory Visit: Payer: Self-pay | Admitting: *Deleted

## 2012-12-06 MED ORDER — LISINOPRIL-HYDROCHLOROTHIAZIDE 10-12.5 MG PO TABS: 1.0000 | ORAL_TABLET | Freq: Every day | ORAL | Status: DC

## 2012-12-06 NOTE — Telephone Encounter (Signed)
Med filled.  

## 2012-12-22 ENCOUNTER — Ambulatory Visit (INDEPENDENT_AMBULATORY_CARE_PROVIDER_SITE_OTHER): Payer: BC Managed Care – PPO | Admitting: Internal Medicine

## 2012-12-22 ENCOUNTER — Encounter: Payer: Self-pay | Admitting: Internal Medicine

## 2012-12-22 VITALS — BP 110/74 | HR 78 | Temp 98.3°F | Resp 16 | Wt 251.0 lb

## 2012-12-22 DIAGNOSIS — J309 Allergic rhinitis, unspecified: Secondary | ICD-10-CM

## 2012-12-22 DIAGNOSIS — G4733 Obstructive sleep apnea (adult) (pediatric): Secondary | ICD-10-CM

## 2012-12-22 DIAGNOSIS — E669 Obesity, unspecified: Secondary | ICD-10-CM

## 2012-12-22 DIAGNOSIS — I10 Essential (primary) hypertension: Secondary | ICD-10-CM

## 2012-12-22 MED ORDER — FLUTICASONE PROPIONATE 50 MCG/ACT NA SUSP: NASAL | Status: DC

## 2012-12-22 MED ORDER — TRIAMTERENE-HCTZ 37.5-25 MG PO TABS: 1.0000 | ORAL_TABLET | Freq: Every day | ORAL | Status: DC

## 2012-12-22 NOTE — Assessment & Plan Note (Signed)
Has not tolerated CPAP trial

## 2012-12-22 NOTE — Progress Notes (Signed)
Patient ID: Steven Holmes, male   DOB: 1960-11-06, 52 y.o.   MRN: [id number]   Patient Active Problem List  Diagnosis  . HTN (hypertension)  . OSA (obstructive sleep apnea)  . Diverticulosis  . Obesity (BMI 30-39.9)  . Lower extremity venous stasis  . Dyspnea  . Allergic rhinitis    Subjective:  CC:   Chief Complaint  Patient presents with  . Follow-up    HPI:   Steven Holmes a 52 y.o. male who presents for 6 month follow up on chronic issues including obesity, chronic dyspnea, OSA, and hypertension.  His breathing has improved.  He has tried all of the OTC antihistaminesand his cough has not improved.  However he denies wheezing. Marland Kitchen He has not used his albuterol inhaler in months.  6 month follow up on chronic issues including obesity, chronic dyspnea, OSA, and hypertension.  His breathing has improved.  He has tried all of the OTC antihistaminesand his cough has not improved.  However he denies wheezing. Marland Kitchen He has not used his albuterol inhaler in months.    Past Medical History  Diagnosis Date  . Hypertension   . Diverticulosis     Past Surgical History  Procedure Laterality Date  . Hernia repair  2009    revision left sided inguinal [facility]  . Vasectomy      The following portions of the patient's history were reviewed and updated as appropriate: Allergies, current medications, and problem list.    Review of Systems:   Patient denies headache, fevers, malaise, unintentional weight loss, skin rash, eye pain, sinus congestion and sinus pain, sore throat, dysphagia,  hemoptysis , cough, dyspnea, wheezing, chest pain, palpitations, orthopnea, edema, abdominal pain, nausea, melena, diarrhea, constipation, flank pain, dysuria, hematuria, urinary  Frequency, nocturia, numbness, tingling, seizures,  Focal weakness, Loss of consciousness,  Tremor, insomnia, depression, anxiety, and suicidal ideation.     History   Social History  . Marital Status: Married    Spouse  Name: N/A    Number of Children: N/A  . Years of Education: N/A   Occupational History  . Not on file.   Social History Main Topics  . Smoking status: Former Smoker -- 1.00 packs/day for 25 years    Types: Cigarettes    Quit date: 11/03/2000  . Smokeless tobacco: Never Used  . Alcohol Use: No  . Drug Use: No  . Sexually Active: Not on file   Other Topics Concern  . Not on file   Social History Narrative  . No narrative on file    Objective:  BP 110/74  Pulse 78  Temp(Src) 98.3 F (36.8 C) (Oral)  Resp 16  Wt 251 lb (113.853 kg)  BMI 36.01 kg/m2  SpO2 91%  General appearance: alert, cooperative and appears stated age Ears: normal TM's and external ear canals both ears Throat: lips, mucosa, and tongue normal; teeth and gums normal Neck: no adenopathy, no carotid bruit, supple, symmetrical, trachea midline and thyroid not enlarged, symmetric, no tenderness/mass/nodules Back: symmetric, no curvature. ROM normal. No CVA tenderness. Lungs: clear to auscultation bilaterally Heart: regular rate and rhythm, S1, S2 normal, no murmur, click, rub or gallop Abdomen: soft, non-tender; bowel sounds normal; no masses,  no organomegaly Pulses: 2+ and symmetric Skin: Skin color, texture, turgor normal. No rashes or lesions Lymph nodes: Cervical, supraclavicular, and axillary nodes normal.  Assessment and Plan:  OSA (obstructive sleep apnea) Has not tolerated CPAP trial   HTN (hypertension) Well controlled on current regimen,  but he has a persistent cough that may be ACE Inhibitor induced.  switching to Maxzide.   Obesity (BMI 30-39.9) I have addressed  BMI and recommended a low glycemic index diet utilizing smaller more frequent meals to increase metabolism.  I have also recommended that patient start exercising with a goal of 30 minutes of aerobic exercise a minimum of 5 days per week.    Allergic rhinitis Trial of steroid nasal spray.    Updated Medication  List Outpatient Encounter Prescriptions as of 12/22/2012  Medication Sig Dispense Refill  . Multiple Vitamin (MULTIVITAMIN) tablet Take 1 tablet by mouth daily.      . [DISCONTINUED] lisinopril-hydrochlorothiazide (PRINZIDE,ZESTORETIC) 10-12.5 MG per tablet Take 1 tablet by mouth daily.  30 tablet  3  . albuterol (PROVENTIL HFA;VENTOLIN HFA) 108 (90 BASE) MCG/ACT inhaler Inhale 2 puffs into the lungs every 6 (six) hours as needed for wheezing.  1 Inhaler  0  . fluticasone (FLONASE) 50 MCG/ACT nasal spray 2 sprays in each nostril once daily  16 g  6  . triamterene-hydrochlorothiazide (MAXZIDE-25) 37.5-25 MG per tablet Take 1 each (1 tablet total) by mouth daily.  90 tablet  3   No facility-administered encounter medications on file as of 12/22/2012.     No orders of the defined types were placed in this encounter.    No Follow-up on file.

## 2012-12-22 NOTE — Patient Instructions (Addendum)
We are changing your blood pressure medications from lisinopril to maxzide to see if the cough is from the lisinopril.  It may take up to 2 months to resolve  We are adding a nasal spray 2 squirts on each side daily for allergies  Return for fasting labs in 2 weeks   You need to lose 10%  Of your current body weight over the next 6 months   This is  my version of a  "Low GI"  Diet:  It is not ultra low carb, but will still lower your blood sugars and allow you to lose 5 to 10 lbs per month if you follow it carefully. All of the foods can be found at grocery stores and in bulk at [employer].  The Atkins protein bars and shakes are available in more varieties at Target, [employer] and [employer].     7 AM Breakfast:  Low carbohydrate Protein  Shakes (I recommend the EAS AdvantEdge "Carb Control" shakes  Or the low carb shakes by Atkins.   Both are available everywhere:  In  cases at BJs  Or in 4 packs at grocery stores and pharmacies  2.5 carbs  (Alternative is  a toasted Arnold's Sandwhich Thin w/ peanut butter, a "Bagel Thin" with cream cheese and salmon) or  a scrambled egg burrito made with a low carb tortilla .  Avoid cereal and bananas, oatmeal too unless you are cooking the old fashioned kind that takes 30-40 minutes to prepare.  the rest is overly processed, has minimal fiber, and is loaded with carbohydrates!   10 AM: Protein bar by Atkins (the snack size, under 200 cal).  There are many varieties , available widely again or in bulk in limited varieties at BJs)  Other so called "protein bars" tend to be loaded with carbohydrates.  Remember, in food advertising, the word "energy" is synonymous for " carbohydrate."  Lunch: sandwich of [place], (or any lunchmeat, grilled meat or canned tuna), fresh avocado, mayonnaise  and cheese on a lower carbohydrate pita bread, flatbread, or tortilla . Ok to use regular mayonnaise. The bread is the only source or carbohydrate that can be decreased (Joseph's  makes a pita bread and a flat bread that are 50 cal and 4 net carbs ; Toufayan makes a low carb flatbread that's 100 cal and 9 net carbs  and  Mission makes a low carb whole wheat tortilla  That is 210 cal and 6 net carbs)  3 PM:  Mid day :  Another protein bar,  Or a  cheese stick (100 cal, 0 carbs),  Or 1 ounce of  almonds, walnuts, pistachios, pecans, peanuts,  Macadamia nuts. Or a Dannon light n Fit greek yogurt, 80 cal 8 net carbs . Avoid "granola"; the dried cranberries and raisins are loaded with carbohydrates. Mixed nuts ok if no raisins or cranberries or dried fruit.      6 PM  Dinner:  "mean and green:"  Meat/chicken/fish or a high protein legume; , with a green salad, and a low GI  Veggie (broccoli, cauliflower, green beans, spinach, brussel sprouts. Lima beans) : Avoid "Low fat dressings, as well as Reyne Dumas and [address]! They are loaded with sugar! Instead use ranch, vinagrette,  Blue cheese, etc.  There is a low carb pasta by Dreamfield's available at [employer] that is acceptable and tastes great. Try Michel Angel's chicken piccata over low carb pasta. The chicken dish is 0 carbs, and can be found  in frozen section at [employer]Js and [employer]. Also try [employer]" (pulled pork, no sauce,  0 carbs) and his pot roast.   both are in the refrigerated section at [employer]Js   Dreamfield's makes a low carb pasta only 5 g/serving.  Available at all grocery stores,  And tastes like normal pasta  9 PM snack : Breyer's "low carb" fudgsicle or  ice cream bar (Carb Smart line), or  Weight Watcher's ice cream bar , or another "no sugar added" ice cream;a serving of fresh berries/cherries with whipped cream (Avoid bananas, pineapple, grapes  and watermelon on a regular basis because they are high in sugar)   Remember that snack Substitutions should be less than 10 carbs per serving and meals < 20 carbs. Remember to subtract fiber grams and sugar alcohols to get the "net carbs."

## 2012-12-25 ENCOUNTER — Encounter: Payer: Self-pay | Admitting: Internal Medicine

## 2012-12-25 DIAGNOSIS — J309 Allergic rhinitis, unspecified: Secondary | ICD-10-CM | POA: Insufficient documentation

## 2012-12-25 NOTE — Assessment & Plan Note (Signed)
I have addressed  BMI and recommended a low glycemic index diet utilizing smaller more frequent meals to increase metabolism.  I have also recommended that patient start exercising with a goal of 30 minutes of aerobic exercise a minimum of 5 days per week.  

## 2012-12-25 NOTE — Assessment & Plan Note (Signed)
Well controlled on current regimen, but he has a persistent cough that may be ACE Inhibitor induced.  switching to Maxzide.

## 2012-12-25 NOTE — Assessment & Plan Note (Signed)
Trial of steroid nasal spray.

## 2013-01-19 ENCOUNTER — Telehealth: Payer: Self-pay | Admitting: *Deleted

## 2013-01-19 DIAGNOSIS — E119 Type 2 diabetes mellitus without complications: Secondary | ICD-10-CM

## 2013-01-19 DIAGNOSIS — Z125 Encounter for screening for malignant neoplasm of prostate: Secondary | ICD-10-CM

## 2013-01-19 NOTE — Telephone Encounter (Signed)
Pt is coming in for labs tomorrow 03.20.2014 what labs ans dx would you like ? Thank you

## 2013-01-20 ENCOUNTER — Other Ambulatory Visit (INDEPENDENT_AMBULATORY_CARE_PROVIDER_SITE_OTHER): Payer: BC Managed Care – PPO

## 2013-01-20 DIAGNOSIS — E119 Type 2 diabetes mellitus without complications: Secondary | ICD-10-CM

## 2013-01-20 LAB — LIPID PANEL
Total CHOL/HDL Ratio: 7
VLDL: 40.6 mg/dL — ABNORMAL HIGH (ref 0.0–40.0)

## 2013-01-20 LAB — COMPREHENSIVE METABOLIC PANEL
ALT: 31 U/L (ref 0–53)
AST: 24 U/L (ref 0–37)
Albumin: 4 g/dL (ref 3.5–5.2)
Alkaline Phosphatase: 75 U/L (ref 39–117)
Glucose, Bld: 185 mg/dL — ABNORMAL HIGH (ref 70–99)
Potassium: 3.9 mEq/L (ref 3.5–5.1)
Sodium: 136 mEq/L (ref 135–145)
Total Protein: 7.2 g/dL (ref 6.0–8.3)

## 2013-01-20 LAB — MICROALBUMIN / CREATININE URINE RATIO
Creatinine,U: 190.7 mg/dL
Microalb, Ur: 26.3 mg/dL — ABNORMAL HIGH (ref 0.0–1.9)

## 2013-01-21 ENCOUNTER — Encounter: Payer: Self-pay | Admitting: Internal Medicine

## 2013-01-24 ENCOUNTER — Encounter: Payer: Self-pay | Admitting: General Practice

## 2013-05-03 ENCOUNTER — Ambulatory Visit (INDEPENDENT_AMBULATORY_CARE_PROVIDER_SITE_OTHER): Payer: BC Managed Care – PPO | Admitting: Adult Health

## 2013-05-03 ENCOUNTER — Encounter: Payer: Self-pay | Admitting: Adult Health

## 2013-05-03 VITALS — BP 122/88 | HR 92 | Temp 98.1°F | Resp 12 | Wt 247.0 lb

## 2013-05-03 DIAGNOSIS — E119 Type 2 diabetes mellitus without complications: Secondary | ICD-10-CM

## 2013-05-03 DIAGNOSIS — I10 Essential (primary) hypertension: Secondary | ICD-10-CM

## 2013-05-03 DIAGNOSIS — R42 Dizziness and giddiness: Secondary | ICD-10-CM

## 2013-05-03 MED ORDER — LOSARTAN POTASSIUM 25 MG PO TABS: 25.0000 mg | ORAL_TABLET | Freq: Every day | ORAL | Status: DC

## 2013-05-03 NOTE — Assessment & Plan Note (Addendum)
Pt having some side effects to Maxide. Possibly secondary to increasing temperatures and dehydration. He also feels this medication was increasing his BG. He does not want to continue this medication. Stop Maxide and start Losartan 25 mg daily. RTC in 3-4 weeks for follow up. Drink fluids to stay hydrated. Check bmet, HgbA1C.

## 2013-05-03 NOTE — Progress Notes (Signed)
  Subjective:    Patient ID: Steven Holmes, male    DOB: [birth year], 52 y.o.   MRN: [id number]  HPI  Pt is here for f/u for b/p after changing medication to [facility]. Pt reports he was doing well with medication until approximately 2 weeks ago. Patient is experiencing dizziness. Unable to stand for extended periods of time. Patient reports symptoms originally began when it was extremely hot at his place of employment. The work place is not well ventilated and it stays hot. He had not had any problems prior.   Current Outpatient Prescriptions on File Prior to Visit  Medication Sig Dispense Refill  . albuterol (PROVENTIL HFA;VENTOLIN HFA) 108 (90 BASE) MCG/ACT inhaler Inhale 2 puffs into the lungs every 6 (six) hours as needed for wheezing.  1 Inhaler  0  . fluticasone (FLONASE) 50 MCG/ACT nasal spray 2 sprays in each nostril once daily  16 g  6  . Multiple Vitamin (MULTIVITAMIN) tablet Take 1 tablet by mouth daily.       No current facility-administered medications on file prior to visit.    Review of Systems  HENT: Positive for congestion, postnasal drip and sinus pressure.   Respiratory: Negative.   Cardiovascular: Negative.   Neurological: Positive for dizziness and light-headedness. Negative for tremors, seizures, speech difficulty and weakness.   BP 122/88  Pulse 92  Temp(Src) 98.1 F (36.7 C) (Oral)  Resp 12  Wt 247 lb (112.038 kg)  BMI 35.44 kg/m2  SpO2 96%    Objective:   Physical Exam  Constitutional: He is oriented to person, place, and time.  Overweight, male in NAD  HENT:  Head: Normocephalic and atraumatic.  Right Ear: External ear normal.  Left Ear: External ear normal.  Mouth/Throat: Oropharynx is clear and moist. No oropharyngeal exudate.  Eyes: Conjunctivae and EOM are normal. Pupils are equal, round, and reactive to light.  Cardiovascular: Normal rate and regular rhythm.   Pulmonary/Chest: Effort normal and breath sounds normal.  Lymphadenopathy:    He has no  cervical adenopathy.  Neurological: He is alert and oriented to person, place, and time. Coordination normal.  Psychiatric: He has a normal mood and affect. His behavior is normal. Judgment and thought content normal.       Assessment & Plan:

## 2013-05-03 NOTE — Patient Instructions (Addendum)
  Stop the Maxide.  Start Losartan 25 mg daily.  Return for fasting blood work tomorrow morning.  Follow up appointment in 2-3 weeks.

## 2013-05-04 ENCOUNTER — Other Ambulatory Visit (INDEPENDENT_AMBULATORY_CARE_PROVIDER_SITE_OTHER): Payer: BC Managed Care – PPO

## 2013-05-04 DIAGNOSIS — R42 Dizziness and giddiness: Secondary | ICD-10-CM

## 2013-05-04 DIAGNOSIS — E119 Type 2 diabetes mellitus without complications: Secondary | ICD-10-CM

## 2013-05-04 LAB — BASIC METABOLIC PANEL
CO2: 23 mEq/L (ref 19–32)
Chloride: 99 mEq/L (ref 96–112)
Creatinine, Ser: 0.9 mg/dL (ref 0.4–1.5)

## 2013-05-04 LAB — HEMOGLOBIN A1C: Hgb A1c MFr Bld: 10 % — ABNORMAL HIGH (ref 4.6–6.5)

## 2013-05-18 ENCOUNTER — Ambulatory Visit: Payer: BC Managed Care – PPO | Admitting: Internal Medicine

## 2013-05-20 ENCOUNTER — Encounter: Payer: Self-pay | Admitting: Internal Medicine

## 2013-05-20 ENCOUNTER — Ambulatory Visit (INDEPENDENT_AMBULATORY_CARE_PROVIDER_SITE_OTHER): Payer: BC Managed Care – PPO | Admitting: Internal Medicine

## 2013-05-20 VITALS — BP 126/80 | HR 71 | Temp 98.2°F | Resp 14 | Wt 247.5 lb

## 2013-05-20 MED ORDER — BAYER CONTOUR MONITOR W/DEVICE KIT: PACK | Status: DC

## 2013-05-20 NOTE — Progress Notes (Signed)
Patient ID: Steven Holmes, male   DOB: 08-22-1961, 52 y.o.   MRN: [id number]   Patient Active Problem List   Diagnosis Date Noted  . Lower extremity venous stasis 05/13/2012    Priority: High  . Diabetes mellitus type 2, uncontrolled, without complications 05/22/2013  . Allergic rhinitis 12/25/2012  . Dyspnea 08/17/2012  . HTN (hypertension) 05/13/2012  . OSA (obstructive sleep apnea) 05/13/2012  . Obesity (BMI 30-39.9) 05/13/2012  . Diverticulosis     Subjective:  CC:   Chief Complaint  Patient presents with  . Follow-up    HTN medication change and spike in blood sugar reported by patient    HPI:   Steven Holmes a 52 y.o. male who presents for 2 week follow up for dizziness.  He was evaluated by  Orville Govern and noted to be overdue for 3 month follow up diet controlled DM so labs were drawn and he was noted be hyperglycemic due to uncontrolled diabetes,  A1c 10.9 now,  (A1c was 6.9 in March at last scheduled visit) . Has been having polyuria, polydipsia  And uninentional wt loss of 5 lbs .  He does not check his blood sugars.  His last meal today was 11:00 am ,  Hamburger patty with corn and potatoes.  Diet reviewed.  He drinks sugar free sodas and tea. Eats protein bars , sometimes grits,  seldom cereal.  He does not drink alcohol.      Past Medical History  Diagnosis Date  . Hypertension   . Diverticulosis     Past Surgical History  Procedure Laterality Date  . Hernia repair  2009    revision left sided inguinal at [facility]  . Vasectomy      The following portions of the patient's history were reviewed and updated as appropriate: Allergies, current medications, and problem list.    Review of Systems:   Patient denies headache, fevers, malaise, unintentional weight loss, skin rash, eye pain, sinus congestion and sinus pain, sore throat, dysphagia,  hemoptysis , cough, dyspnea, wheezing, chest pain, palpitations, orthopnea, edema, abdominal pain, nausea, melena,  diarrhea, constipation, flank pain, dysuria, hematuria, urinary  Frequency, nocturia, numbness, tingling, seizures,  Focal weakness, Loss of consciousness,  Tremor, insomnia, depression, anxiety, and suicidal ideation.     History   Social History  . Marital Status: Married    Spouse Name: N/A    Number of Children: N/A  . Years of Education: N/A   Occupational History  . Not on file.   Social History Main Topics  . Smoking status: Former Smoker -- 1.00 packs/day for 25 years    Types: Cigarettes    Quit date: 11/03/2000  . Smokeless tobacco: Never Used  . Alcohol Use: No  . Drug Use: No  . Sexually Active: Not on file   Other Topics Concern  . Not on file   Social History Narrative  . No narrative on file    Objective:  BP 126/80  Pulse 71  Temp(Src) 98.2 F (36.8 C) (Oral)  Resp 14  Wt 247 lb 8 oz (112.265 kg)  BMI 35.51 kg/m2  SpO2 97%  General appearance: alert, cooperative and appears stated age Ears: normal TM's and external ear canals both ears Throat: lips, mucosa, and tongue normal; teeth and gums normal Neck: no adenopathy, no carotid bruit, supple, symmetrical, trachea midline and thyroid not enlarged, symmetric, no tenderness/mass/nodules Back: symmetric, no curvature. ROM normal. No CVA tenderness. Lungs: clear to auscultation bilaterally Heart: regular rate and  rhythm, S1, S2 normal, no murmur, click, rub or gallop Abdomen: soft, non-tender; bowel sounds normal; no masses,  no organomegaly Pulses: 2+ and symmetric Skin: Skin color, texture, turgor normal. No rashes or lesions Lymph nodes: Cervical, supraclavicular, and axillary nodes normal.  Assessment and Plan:  Diabetes mellitus type 2, uncontrolled, without complications Patient will be started on levemir to safely lower blood sugars over the next several week.  He was instructed on use of insulin pens ,  Use of glucometer, and asked to start metformin.  He will return in one week with log of  blood sugars.  Referral to Diabetes Education and Opth for dilated diabetic eye exam  A total of 40 minutes was spent with patient more than half of which was spent in counseling.   Updated Medication List Outpatient Encounter Prescriptions as of 05/20/2013  Medication Sig Dispense Refill  . losartan (COZAAR) 25 MG tablet Take 1 tablet (25 mg total) by mouth daily.  30 tablet  3  . Multiple Vitamin (MULTIVITAMIN) tablet Take 1 tablet by mouth daily.      Marland Kitchen albuterol (PROVENTIL HFA;VENTOLIN HFA) 108 (90 BASE) MCG/ACT inhaler Inhale 2 puffs into the lungs every 6 (six) hours as needed for wheezing.  1 Inhaler  0  . Blood Glucose Monitoring Suppl (BAYER CONTOUR MONITOR) W/DEVICE KIT Use twice daily to check blood sugars  1 kit  0  . fluticasone (FLONASE) 50 MCG/ACT nasal spray 2 sprays in each nostril once daily  16 g  6   No facility-administered encounter medications on file as of 05/20/2013.     Orders Placed This Encounter  Procedures  . Ambulatory referral to diabetic education  . Ambulatory referral to Ophthalmology    Return in about 1 week (around 05/27/2013).

## 2013-05-20 NOTE — Patient Instructions (Addendum)
Your diabetes is out of control .  I am starting you on a type of insulin called Levemir .  Take 25 units daily.  I am also starting you on metformin,  500 mg twice daily   Please use the glucometer we gave you to check your sugars twice a day at the following times:  1) fasting   2) 2 hours after any meal (you can choose different meals every day to check  Return in one week to see me or Raquel and bring your sugar log with you   You need to follow a low glycemic diet .  You should limit your starches to one serving per meal MAXIMUM (that means corn and potatoes at the same meal is a NO NO!!)  This is  One version of a  "Low GI"  Diet:  It will lower your blood sugars and allow you to lose 4 to 8  lbs  per month if you follow it carefully.  You can also lower them by getting some exercise every day,  Particularly a 15 minute walk after your biggest meal    All of the foods can be found at grocery stores and in bulk at [employer].  The Atkins protein bars and shakes are available in more varieties at Target, WalMart and [employer].     7 AM Breakfast:  Choose from the following:  Low carbohydrate Protein  Shakes (I recommend the EAS AdvantEdge "Carb Control" shakes  Or the low carb shakes by Atkins.    2.5 carbs   Arnold's "Sandwhich Thin"toasted  w/ peanut butter (no jelly: about 20 net carbs  "Bagel Thin" with cream cheese and salmon: about 20 carbs   a scrambled egg/bacon/cheese burrito made with Mission's "carb balance" whole wheat tortilla  (about 10 net carbs )   Avoid cereal and bananas, oatmeal and cream of wheat and grits. They are loaded with carbohydrates!   10 AM: high protein snack:  Protein bar by Atkins (the snack size, under 200 cal, usually < 6 net carbs).    A stick of cheese:  Around 1 carb,  100 cal     Dannon Light n Fit [place] Yogurt  (80 cal, 8 carbs)  Other so called "protein bars" and Greek yogurts tend to be loaded with carbohydrates.  Remember, in food  advertising, the word "energy" is synonymous for " carbohydrate."  Lunch:   A Sandwich using the bread choices listed, Can use any  Eggs,  lunchmeat, grilled meat or canned tuna), avocado, regular mayo/mustard  and cheese.  A Salad using blue cheese, ranch,  Goddess or vinagrette,  No croutons or "confetti" and no "candied nuts" but regular nuts OK.   No pretzels or chips.  Pickles and miniature sweet peppers are a good low carb alternative that provide a "crunch"  The bread is the only source of carbohydrate in a sandwich and  can be decreased by trying some of these alternatives to traditional loaf bread  Joseph's makes a pita bread and a flat bread that are 50 cal and 4 net carbs available at BJs and WalMart.  This can be toasted to use with hummous as well  Toufayan makes a low carb flatbread that's 100 cal and 9 net carbs available at [employer] and [employer] makes 2 sizes of  Low carb whole wheat tortilla  (The large one is 210 cal and 6 net carbs) Avoid "Low fat dressings, as well as [employer]  and [facility] dressings They are loaded with sugar!   3 PM/ Mid day  Snack:  Consider  1 ounce of  almonds, walnuts, pistachios, pecans, peanuts,  Macadamia nuts or a nut medley.  Avoid "granola"; the dried cranberries and raisins are loaded with carbohydrates. Mixed nuts as long as there are no raisins,  cranberries or dried fruit.     6 PM  Dinner:     Meat/fowl/fish with a green salad, and either broccoli, cauliflower, green beans, spinach, brussel sprouts or  Lima beans. DO NOT BREAD THE PROTEIN!!      There is a low carb pasta by Dreamfield's that is acceptable and tastes great: only 5 digestible carbs/serving.( All grocery stores but BJs carry it )  Try Kai Levins Angelo's chicken piccata or chicken or eggplant parm over low carb pasta.(Lowes and BJs)   Clifton Custard Sanchez's "Carnitas" (pulled pork, no sauce,  0 carbs) or his beef pot roast to make a dinner burrito (at BJ's)  Pesto over low  carb pasta (bj's sells a good quality pesto in the center refrigerated section of the deli   Whole wheat pasta is still full of digestible carbs and  Not as low in glycemic index as Dreamfield's.   Brown rice is still rice,  So skip the rice and noodles if you eat [place] or [place] (or at least limit to 1/2 cup)  9 PM snack :   Breyer's "low carb" fudgsicle or  ice cream bar (Carb Smart line), or  Weight Watcher's ice cream bar , or another "no sugar added" ice cream;  a serving of fresh berries/cherries with whipped cream   Cheese or DANNON'S LlGHT N FIT GREEK YOGURT  Avoid bananas, pineapple, grapes  and watermelon on a regular basis because they are high in sugar.  THINK OF THEM AS DESSERT  Remember that snack Substitutions should be less than 10 NET carbs per serving and meals < 20 carbs. Remember to subtract fiber grams to get the "net carbs."

## 2013-05-22 ENCOUNTER — Encounter: Payer: Self-pay | Admitting: Internal Medicine

## 2013-05-22 DIAGNOSIS — E119 Type 2 diabetes mellitus without complications: Secondary | ICD-10-CM | POA: Insufficient documentation

## 2013-05-22 MED ORDER — INSULIN DETEMIR 100 UNIT/ML ~~LOC~~ SOLN: 25.0000 [IU] | Freq: Every day | SUBCUTANEOUS | Status: DC

## 2013-05-22 NOTE — Assessment & Plan Note (Signed)
Patient will be started on levemir to safely lower blood sugars over the next several week.  He was instructed on use of insulin pens ,  Use of glucometer, and asked to start metformin.  He will return in one week with log of blod sugars.  Referral to Diabetes Education and Opth for dilated diabetic eye exam

## 2013-05-23 ENCOUNTER — Telehealth: Payer: Self-pay | Admitting: Internal Medicine

## 2013-05-23 NOTE — Telephone Encounter (Signed)
Pt says his pharmacy never received the rx for Metformin ?? He uses [facility] on [address]

## 2013-05-25 MED ORDER — METFORMIN HCL 500 MG PO TABS: 500.0000 mg | ORAL_TABLET | Freq: Two times a day (BID) | ORAL | Status: DC

## 2013-05-25 NOTE — Telephone Encounter (Signed)
Per last note, needed to start Metformin 500 mg, bid. Just wanted to clarify before I send in, regular Metformin? Not the XR

## 2013-05-25 NOTE — Telephone Encounter (Signed)
Thanks,  i sent it in .

## 2013-05-25 NOTE — Telephone Encounter (Signed)
Left message, notifying pt that Rx was sent to pharmacy.

## 2013-05-30 ENCOUNTER — Ambulatory Visit: Payer: BC Managed Care – PPO | Admitting: Internal Medicine

## 2013-06-03 ENCOUNTER — Encounter: Payer: Self-pay | Admitting: Internal Medicine

## 2013-06-03 ENCOUNTER — Ambulatory Visit (INDEPENDENT_AMBULATORY_CARE_PROVIDER_SITE_OTHER): Payer: BC Managed Care – PPO | Admitting: Internal Medicine

## 2013-06-03 VITALS — BP 130/86 | HR 67 | Temp 97.5°F | Resp 14 | Wt 246.2 lb

## 2013-06-03 DIAGNOSIS — E1149 Type 2 diabetes mellitus with other diabetic neurological complication: Secondary | ICD-10-CM

## 2013-06-03 DIAGNOSIS — E1142 Type 2 diabetes mellitus with diabetic polyneuropathy: Secondary | ICD-10-CM

## 2013-06-03 DIAGNOSIS — E1165 Type 2 diabetes mellitus with hyperglycemia: Secondary | ICD-10-CM

## 2013-06-03 MED ORDER — GLIPIZIDE 5 MG PO TABS: 5.0000 mg | ORAL_TABLET | Freq: Two times a day (BID) | ORAL | Status: DC

## 2013-06-03 NOTE — Patient Instructions (Addendum)
We are adding glipizide 5 mg twice  Daily before breakfast and supper  (along with metformin)  continue levemir at 25 units daily and check blood sugars twice daily  , both fasting and 2 hr post meal   When  your fasting  Sugars are < 100 .  Reduce levemir  to 15 units,    When  your post prandials (2 hours after meal) are < 130,  Stop the levemir   E mail your blood sugars once a week so I can help you decide   You are over due for your eye exam.

## 2013-06-03 NOTE — Progress Notes (Signed)
Patient ID: Steven Holmes, male   DOB: January 12, 1961, 52 y.o.   MRN: [id number]   Patient Active Problem List   Diagnosis Date Noted  . Lower extremity venous stasis 05/13/2012    Priority: High  . Type 2 diabetes mellitus, uncontrolled, with neuropathy 05/22/2013  . Allergic rhinitis 12/25/2012  . Dyspnea 08/17/2012  . HTN (hypertension) 05/13/2012  . OSA (obstructive sleep apnea) 05/13/2012  . Obesity (BMI 30-39.9) 05/13/2012  . Diverticulosis     Subjective:  CC:   Chief Complaint  Patient presents with  . Follow-up    2 weeks ago high blood sugar.    HPI:   Steven Holmes a 52 y.o. male who presents for one week follow up on uncontrolled diabetes.  Has had one spike after eating french toast with sugar free syrup.  He is using the insulin as directed, 25 units of levemir. Post prandials have been elevated to 200 .     Past Medical History  Diagnosis Date  . Hypertension   . Diverticulosis     Past Surgical History  Procedure Laterality Date  . Hernia repair  2009    revision left sided inguinal at [facility]  . Vasectomy         The following portions of the patient's history were reviewed and updated as appropriate: Allergies, current medications, and problem list.    Review of Systems:   12 Pt  review of systems was negative except those addressed in the HPI,     History   Social History  . Marital Status: Married    Spouse Name: N/A    Number of Children: N/A  . Years of Education: N/A   Occupational History  . Not on file.   Social History Main Topics  . Smoking status: Former Smoker -- 1.00 packs/day for 25 years    Types: Cigarettes    Quit date: 11/03/2000  . Smokeless tobacco: Never Used  . Alcohol Use: No  . Drug Use: No  . Sexually Active: Not on file   Other Topics Concern  . Not on file   Social History Narrative  . No narrative on file    Objective:  BP 130/86  Pulse 67  Temp(Src) 97.5 F (36.4 C) (Oral)  Resp 14  Wt  246 lb 4 oz (111.698 kg)  BMI 35.33 kg/m2  SpO2 99%  General appearance: alert, cooperative and appears stated age Ears: normal TM's and external ear canals both ears Throat: lips, mucosa, and tongue normal; teeth and gums normal Neck: no adenopathy, no carotid bruit, supple, symmetrical, trachea midline and thyroid not enlarged, symmetric, no tenderness/mass/nodules Back: symmetric, no curvature. ROM normal. No CVA tenderness. Lungs: clear to auscultation bilaterally Heart: regular rate and rhythm, S1, S2 normal, no murmur, click, rub or gallop Abdomen: soft, non-tender; bowel sounds normal; no masses,  no organomegaly Pulses: 2+ and symmetric Skin: Skin color, texture, turgor normal. No rashes or lesions Lymph nodes: Cervical, supraclavicular, and axillary nodes normal.  Assessment and Plan:  Type 2 diabetes mellitus, uncontrolled, with neuropathy improved with addition of levemir.  Adding glipizide twice daily with plans to decrease levemir gradually .    Updated Medication List Outpatient Encounter Prescriptions as of 06/03/2013  Medication Sig Dispense Refill  . Blood Glucose Monitoring Suppl (BAYER CONTOUR MONITOR) W/DEVICE KIT Use twice daily to check blood sugars  1 kit  0  . insulin detemir (LEVEMIR) 100 UNIT/ML injection Inject 0.25 mLs (25 Units total) into the skin daily.  6 mL  0  . losartan (COZAAR) 25 MG tablet Take 1 tablet (25 mg total) by mouth daily.  30 tablet  3  . metFORMIN (GLUCOPHAGE) 500 MG tablet Take 1 tablet (500 mg total) by mouth 2 (two) times daily with a meal.  60 tablet  5  . Multiple Vitamin (MULTIVITAMIN) tablet Take 1 tablet by mouth daily.      Marland Kitchen albuterol (PROVENTIL HFA;VENTOLIN HFA) 108 (90 BASE) MCG/ACT inhaler Inhale 2 puffs into the lungs every 6 (six) hours as needed for wheezing.  1 Inhaler  0  . fluticasone (FLONASE) 50 MCG/ACT nasal spray 2 sprays in each nostril once daily  16 g  6  . glipiZIDE (GLUCOTROL) 5 MG tablet Take 1 tablet (5 mg  total) by mouth 2 (two) times daily before a meal.  60 tablet  3   No facility-administered encounter medications on file as of 06/03/2013.     Orders Placed This Encounter  Procedures  . Ambulatory referral to Ophthalmology    Return in about 3 months (around 09/03/2013).

## 2013-06-05 ENCOUNTER — Encounter: Payer: Self-pay | Admitting: Internal Medicine

## 2013-06-05 NOTE — Assessment & Plan Note (Signed)
improved with addition of levemir.  Adding glipizide twice daily with plans to decrease levemir gradually .

## 2013-06-21 ENCOUNTER — Encounter: Payer: Self-pay | Admitting: Emergency Medicine

## 2013-08-26 ENCOUNTER — Other Ambulatory Visit: Payer: Self-pay | Admitting: Adult Health

## 2013-09-06 ENCOUNTER — Encounter (HOSPITAL_COMMUNITY): Payer: Self-pay | Admitting: Emergency Medicine

## 2013-09-06 ENCOUNTER — Emergency Department (HOSPITAL_COMMUNITY): Payer: BC Managed Care – PPO

## 2013-09-06 ENCOUNTER — Emergency Department (HOSPITAL_COMMUNITY)
Admission: EM | Admit: 2013-09-06 | Discharge: 2013-09-07 | Disposition: A | Discharge status: Home / Self Care | Payer: BC Managed Care – PPO | Attending: Emergency Medicine | Admitting: Emergency Medicine

## 2013-09-06 DIAGNOSIS — M519 Unspecified thoracic, thoracolumbar and lumbosacral intervertebral disc disorder: Secondary | ICD-10-CM

## 2013-09-06 DIAGNOSIS — R000 Tachycardia, unspecified: Secondary | ICD-10-CM | POA: Insufficient documentation

## 2013-09-06 DIAGNOSIS — Y9389 Activity, other specified: Secondary | ICD-10-CM | POA: Insufficient documentation

## 2013-09-06 DIAGNOSIS — M51379 Other intervertebral disc degeneration, lumbosacral region without mention of lumbar back pain or lower extremity pain: Secondary | ICD-10-CM | POA: Insufficient documentation

## 2013-09-06 DIAGNOSIS — Z794 Long term (current) use of insulin: Secondary | ICD-10-CM | POA: Insufficient documentation

## 2013-09-06 DIAGNOSIS — I10 Essential (primary) hypertension: Secondary | ICD-10-CM | POA: Insufficient documentation

## 2013-09-06 DIAGNOSIS — Y990 Civilian activity done for income or pay: Secondary | ICD-10-CM | POA: Insufficient documentation

## 2013-09-06 DIAGNOSIS — X500XXA Overexertion from strenuous movement or load, initial encounter: Secondary | ICD-10-CM | POA: Insufficient documentation

## 2013-09-06 DIAGNOSIS — Y929 Unspecified place or not applicable: Secondary | ICD-10-CM | POA: Insufficient documentation

## 2013-09-06 DIAGNOSIS — M5137 Other intervertebral disc degeneration, lumbosacral region: Secondary | ICD-10-CM | POA: Insufficient documentation

## 2013-09-06 DIAGNOSIS — Z87891 Personal history of nicotine dependence: Secondary | ICD-10-CM | POA: Insufficient documentation

## 2013-09-06 DIAGNOSIS — Z79899 Other long term (current) drug therapy: Secondary | ICD-10-CM | POA: Insufficient documentation

## 2013-09-06 DIAGNOSIS — Z8719 Personal history of other diseases of the digestive system: Secondary | ICD-10-CM | POA: Insufficient documentation

## 2013-09-06 DIAGNOSIS — IMO0002 Reserved for concepts with insufficient information to code with codable children: Secondary | ICD-10-CM | POA: Insufficient documentation

## 2013-09-06 LAB — URINALYSIS, ROUTINE W REFLEX MICROSCOPIC
Bilirubin Urine: NEGATIVE
Glucose, UA: NEGATIVE mg/dL
Hgb urine dipstick: NEGATIVE
Ketones, ur: NEGATIVE mg/dL
Leukocytes, UA: NEGATIVE
Nitrite: NEGATIVE
Protein, ur: NEGATIVE mg/dL
Specific Gravity, Urine: 1.024 (ref 1.005–1.030)
Urobilinogen, UA: 0.2 mg/dL (ref 0.0–1.0)
pH: 5 (ref 5.0–8.0)

## 2013-09-06 LAB — BASIC METABOLIC PANEL WITH GFR
BUN: 16 mg/dL (ref 6–23)
CO2: 23 meq/L (ref 19–32)
Calcium: 8.9 mg/dL (ref 8.4–10.5)
Chloride: 97 meq/L (ref 96–112)
Creatinine, Ser: 0.89 mg/dL (ref 0.50–1.35)
GFR calc Af Amer: >90 mL/min
GFR calc non Af Amer: >90 mL/min
Glucose, Bld: 201 mg/dL — ABNORMAL HIGH (ref 70–99)
Potassium: 4.3 meq/L (ref 3.5–5.1)
Sodium: 134 meq/L — ABNORMAL LOW (ref 135–145)

## 2013-09-06 LAB — CBC
HCT: 45.3 % (ref 39.0–52.0)
Hemoglobin: 16.3 g/dL (ref 13.0–17.0)
MCH: 33.7 pg (ref 26.0–34.0)
MCHC: 36 g/dL (ref 30.0–36.0)
MCV: 93.6 fL (ref 78.0–100.0)
Platelets: 124 K/uL — ABNORMAL LOW (ref 150–400)
RBC: 4.84 MIL/uL (ref 4.22–5.81)
RDW: 13.2 % (ref 11.5–15.5)
WBC: 14.1 K/uL — ABNORMAL HIGH (ref 4.0–10.5)

## 2013-09-06 LAB — POCT I-STAT TROPONIN I
Troponin i, poc: 0 ng/mL (ref 0.00–0.08)
Troponin i, poc: 0 ng/mL (ref 0.00–0.08)

## 2013-09-06 MED ORDER — HYDROMORPHONE HCL PF 1 MG/ML IJ SOLN
1.0000 mg | Freq: Once | INTRAMUSCULAR | Status: AC
  Administered 2013-09-06: 1 mg via INTRAVENOUS
  Filled 2013-09-06: qty 1

## 2013-09-06 MED ORDER — METHOCARBAMOL 750 MG PO TABS: 750.0000 mg | ORAL_TABLET | Freq: Four times a day (QID) | ORAL | Status: DC

## 2013-09-06 MED ORDER — OXYCODONE-ACETAMINOPHEN 5-325 MG PO TABS: 2.0000 | ORAL_TABLET | ORAL | Status: DC | PRN

## 2013-09-06 MED ORDER — GADOBENATE DIMEGLUMINE 529 MG/ML IV SOLN
20.0000 mL | Freq: Once | INTRAVENOUS | Status: AC
  Administered 2013-09-06: 20 mL via INTRAVENOUS

## 2013-09-06 NOTE — ED Notes (Signed)
Patient presents today with a chief complaint of lower back pain while lifting a heavy box at work today but also reports symptoms of dizziness, weakness and near syncopal episode. Patient reports headache and mid lower back pain, denies chest pain and shortness of breath.

## 2013-09-06 NOTE — ED Provider Notes (Signed)
CSN: [id number]     Arrival date & time 09/06/13  1821 History   First MD Initiated Contact with Patient 09/06/13 2004     Chief Complaint  Patient presents with  . Fatigue   (Consider location/radiation/quality/duration/timing/severity/associated sxs/prior Treatment) The history is provided by the patient.   Pt here with sudden onset of low back pain after lifting a heavy back--describes weakness with standing--no bowel or bladder dysfx--no fever or chills, denies dysuria or heamturia--sx worse with movement, no prior h/o same--denies rashes , no tx used pta--denies HA or photophobia--denies syncope or near syncope Past Medical History  Diagnosis Date  . Hypertension   . Diverticulosis    Past Surgical History  Procedure Laterality Date  . Hernia repair  2009    revision left sided inguinal at [facility]  . Vasectomy     Family History  Problem Relation Age of Onset  . Cancer Mother     skin Ca  . Cancer Father     basal cell , nose    History  Substance Use Topics  . Smoking status: Former Smoker -- 1.00 packs/day for 25 years    Types: Cigarettes    Quit date: 11/03/2000  . Smokeless tobacco: Never Used  . Alcohol Use: No    Review of Systems  All other systems reviewed and are negative.    Allergies  Review of patient's allergies indicates no known allergies.  Home Medications   Current Outpatient Rx  Name  Route  Sig  Dispense  Refill  . albuterol (PROVENTIL HFA;VENTOLIN HFA) 108 (90 BASE) MCG/ACT inhaler   Inhalation   Inhale 2 puffs into the lungs every 6 (six) hours as needed for wheezing.   1 Inhaler   0   . Blood Glucose Monitoring Suppl (BAYER CONTOUR MONITOR) W/DEVICE KIT      Use twice daily to check blood sugars   1 kit   0   . fluticasone (FLONASE) 50 MCG/ACT nasal spray      2 sprays in each nostril once daily   16 g   6   . glipiZIDE (GLUCOTROL) 5 MG tablet   Oral   Take 1 tablet (5 mg total) by mouth 2 (two) times daily before a meal.   60 tablet   3   . insulin detemir (LEVEMIR) 100 UNIT/ML injection   Subcutaneous   Inject 0.25 mLs (25 Units total) into the skin daily.   6 mL   0   . losartan (COZAAR) 25 MG tablet      TAKE ONE TABLET BY MOUTH ONCE DAILY   30 tablet   5   . metFORMIN (GLUCOPHAGE) 500 MG tablet   Oral   Take 1 tablet (500 mg total) by mouth 2 (two) times daily with a meal.   60 tablet   5   . Multiple Vitamin (MULTIVITAMIN) tablet   Oral   Take 1 tablet by mouth daily.          BP 122/72  Pulse 105  Temp(Src) 98.9 F (37.2 C) (Oral)  Resp 18  Wt 237 lb 14.4 oz (107.911 kg)  SpO2 97% Physical Exam  Nursing note and vitals reviewed. Constitutional: He is oriented to person, place, and time. He appears well-developed and well-nourished.  Non-toxic appearance. No distress.  HENT:  Head: Normocephalic and atraumatic.  Eyes: Conjunctivae, EOM and lids are normal. Pupils are equal, round, and reactive to light.  Neck: Normal range of motion. Neck supple. No tracheal deviation  present. No mass present.  Cardiovascular: Regular rhythm and normal heart sounds.  Tachycardia present.  Exam reveals no gallop.   No murmur heard. Pulmonary/Chest: Effort normal and breath sounds normal. No stridor. No respiratory distress. He has no decreased breath sounds. He has no wheezes. He has no rhonchi. He has no rales.  Abdominal: Soft. Normal appearance and bowel sounds are normal. He exhibits no distension. There is no tenderness. There is no rebound and no CVA tenderness.  Musculoskeletal: Normal range of motion. He exhibits no edema and no tenderness.       Arms: Neurological: He is alert and oriented to person, place, and time. He has normal strength. No cranial nerve deficit or sensory deficit. GCS eye subscore is 4. GCS verbal subscore is 5. GCS motor subscore is 6.  Skin: Skin is warm and dry. No abrasion and no rash noted.  Psychiatric: He has a normal mood and affect. His speech is normal and  behavior is normal.    ED Course  Procedures (including critical care time) Labs Review Labs Reviewed  CBC - Abnormal; Notable for the following:    WBC 14.1 (*)    Platelets 124 (*)    All other components within normal limits  BASIC METABOLIC PANEL - Abnormal; Notable for the following:    Sodium 134 (*)    Glucose, Bld 201 (*)    All other components within normal limits  POCT I-STAT TROPONIN I  POCT I-STAT TROPONIN I   Imaging Review No results found.  EKG Interpretation     Ventricular Rate:  107 PR Interval:  142 QRS Duration: 76 QT Interval:  310 QTC Calculation: 413 R Axis:   59 Text Interpretation:  Sinus tachycardia Otherwise normal ECG No significant change since last tracing            MDM  No diagnosis found.  Patient given pain meds here feels better. 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 He was ambulated at the time of discharge and is pain-free. Will followup with his Dr.   Toy Baker, MD 09/06/13 2351

## 2013-09-06 NOTE — ED Notes (Addendum)
Patient transported to MR. 

## 2013-09-06 NOTE — ED Notes (Signed)
Pt wants to be tested for flu

## 2013-09-08 ENCOUNTER — Other Ambulatory Visit: Payer: Self-pay

## 2013-09-08 LAB — URINE CULTURE
Colony Count: NO GROWTH
Culture: NO GROWTH

## 2013-10-28 ENCOUNTER — Other Ambulatory Visit: Payer: Self-pay | Admitting: Internal Medicine

## 2013-12-07 ENCOUNTER — Emergency Department: Payer: Self-pay | Admitting: Emergency Medicine

## 2013-12-07 LAB — BASIC METABOLIC PANEL
Anion Gap: 3 — ABNORMAL LOW (ref 7–16)
BUN: 19 mg/dL — ABNORMAL HIGH (ref 7–18)
CHLORIDE: 102 mmol/L (ref 98–107)
Calcium, Total: 8.9 mg/dL (ref 8.5–10.1)
Co2: 29 mmol/L (ref 21–32)
Creatinine: 0.97 mg/dL (ref 0.60–1.30)
Glucose: 145 mg/dL — ABNORMAL HIGH (ref 65–99)
Osmolality: 273 (ref 275–301)
Potassium: 4.4 mmol/L (ref 3.5–5.1)
Sodium: 134 mmol/L — ABNORMAL LOW (ref 136–145)

## 2013-12-07 LAB — CBC
HCT: 47 % (ref 40.0–52.0)
HGB: 16.3 g/dL (ref 13.0–18.0)
MCH: 33.4 pg (ref 26.0–34.0)
MCHC: 34.7 g/dL (ref 32.0–36.0)
MCV: 96 fL (ref 80–100)
Platelet: 122 10*3/uL — ABNORMAL LOW (ref 150–440)
RBC: 4.89 10*6/uL (ref 4.40–5.90)
RDW: 14.4 % (ref 11.5–14.5)
WBC: 10.1 10*3/uL (ref 3.8–10.6)

## 2013-12-07 LAB — TROPONIN I: Troponin-I: <0.02 ng/mL

## 2013-12-07 LAB — PRO B NATRIURETIC PEPTIDE: B-TYPE NATIURETIC PEPTID: 25 pg/mL (ref 0–125)

## 2013-12-19 ENCOUNTER — Other Ambulatory Visit: Payer: Self-pay | Admitting: Internal Medicine

## 2014-01-16 ENCOUNTER — Emergency Department: Payer: Self-pay | Admitting: Emergency Medicine

## 2014-02-01 ENCOUNTER — Ambulatory Visit (INDEPENDENT_AMBULATORY_CARE_PROVIDER_SITE_OTHER): Payer: BC Managed Care – PPO | Admitting: Internal Medicine

## 2014-02-01 ENCOUNTER — Encounter: Payer: Self-pay | Admitting: Internal Medicine

## 2014-02-01 VITALS — BP 140/88 | HR 85 | Temp 99.1°F | Resp 18 | Wt 233.2 lb

## 2014-02-01 DIAGNOSIS — IMO0002 Reserved for concepts with insufficient information to code with codable children: Secondary | ICD-10-CM

## 2014-02-01 DIAGNOSIS — E1140 Type 2 diabetes mellitus with diabetic neuropathy, unspecified: Secondary | ICD-10-CM

## 2014-02-01 DIAGNOSIS — I10 Essential (primary) hypertension: Secondary | ICD-10-CM

## 2014-02-01 DIAGNOSIS — E1165 Type 2 diabetes mellitus with hyperglycemia: Principal | ICD-10-CM

## 2014-02-01 DIAGNOSIS — E1142 Type 2 diabetes mellitus with diabetic polyneuropathy: Secondary | ICD-10-CM

## 2014-02-01 DIAGNOSIS — E1149 Type 2 diabetes mellitus with other diabetic neurological complication: Secondary | ICD-10-CM

## 2014-02-01 DIAGNOSIS — IMO0001 Reserved for inherently not codable concepts without codable children: Secondary | ICD-10-CM

## 2014-02-01 DIAGNOSIS — M545 Low back pain, unspecified: Secondary | ICD-10-CM

## 2014-02-01 DIAGNOSIS — E669 Obesity, unspecified: Secondary | ICD-10-CM

## 2014-02-01 LAB — HM DIABETES FOOT EXAM: HM Diabetic Foot Exam: NORMAL

## 2014-02-01 MED ORDER — TRAMADOL HCL 50 MG PO TABS: 50.0000 mg | ORAL_TABLET | Freq: Three times a day (TID) | ORAL | Status: DC | PRN

## 2014-02-01 NOTE — Progress Notes (Signed)
Patient ID: Steven ReidKenneth Durand Patch, male   DOB: 03/01/1961, 53 y.o.   MRN: [id number]  Patient Active Problem List   Diagnosis Date Noted  . Lower extremity venous stasis 05/13/2012    Priority: High  . Back pain at L4-L5 level 02/03/2014  . Type 2 diabetes mellitus, uncontrolled, with neuropathy 05/22/2013  . Allergic rhinitis 12/25/2012  . Dyspnea 08/17/2012  . HTN (hypertension) 05/13/2012  . OSA (obstructive sleep apnea) 05/13/2012  . Obesity (BMI 30-39.9) 05/13/2012  . Diverticulosis     Subjective:  CC:   Chief Complaint  Patient presents with  . Follow-up    medical clearance to return to work    HPI:   Steven Holmes is a 53 y.o. male who presents for  Lost to follow up on DM uncontrolled last a1c 10.0 in July 2014.  Has not had access to his glucometer and test strips since December,  Has moved back in to parents. Has not been checking sugars but has been taking medications. Last eye exam August 2014.    Has modified diet and lost weight .   Has been told he has a herniated disk  After developing lower back pain in November complicated by history of scoliosis. Seen in ER for  severe back pain .  MRi was done and showed  Degenerative changes of lumbar spine L3 to L5 with foraminal stenosis  Having severe pain  in the lower back without radiation currently  .  Not taking any pain medication currently,  Just finished a prednisone taper 6 day,  And some other medication prescribed by [facility]  Has an app[facility] to see [facility]ard [facility].   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 The loads are up to 200 lbs ,  Does not wear a back support,  Is waiting for his job to supply it. Has been out of work for two weeks and is requiring a return to work note.     Past Medical History  Diagnosis Date  . Hypertension   . Diverticulosis     Past Surgical History  Procedure  Laterality Date  . Hernia repair  2009    [facility]  . Vasectomy         The following portions of the patient's history were reviewed and updated as appropriate: Allergies, current medications, and problem list.    Review of Systems:   Patient denies headache, fevers, malaise, unintentional weight loss, skin rash, eye pain, sinus congestion and sinus pain, sore throat, dysphagia,  hemoptysis , cough, dyspnea, wheezing, chest pain, palpitations, orthopnea, edema, abdominal pain, nausea, melena, diarrhea, constipation, flank pain, dysuria, hematuria, urinary  Frequency, nocturia, numbness, tingling, seizures,  Focal weakness, Loss of consciousness,  Tremor, insomnia, depression, anxiety, and suicidal ideation.     History   Social History  . Marital Status: Married    Spouse Name: N/A    Number of Children: N/A  . Years of Education: N/A   Occupational History  . Not on file.   Social History Main Topics  . Smoking status: Former Smoker -- 1.00 packs/day for 25 years    Types: Cigarettes    Quit date: 11/03/2000  . Smokeless tobacco: Never Used  . Alcohol Use: No  . Drug Use: No  . Sexual Activity: Not on file   Other Topics Concern  . Not on file  Social History Narrative  . No narrative on file    Objective:  Filed Vitals:   02/01/14 1437  BP: 140/88  Pulse: 85  Temp: 99.1 F (37.3 C)  Resp: 18     General appearance: alert, cooperative and appears stated age Ears: normal TM's and external ear canals both ears Throat: lips, mucosa, and tongue normal; teeth and gums normal Neck: no adenopathy, no carotid bruit, supple, symmetrical, trachea midline and thyroid not enlarged, symmetric, no tenderness/mass/nodules Back: symmetric, no curvature. ROM normal. No CVA tenderness. Lungs: clear to auscultation bilaterally Heart: regular rate and rhythm, S1, S2 normal, no murmur, click, rub or gallop Abdomen: soft, non-tender; bowel  sounds normal; no masses,  no organomegaly Pulses: 2+ and symmetric Skin: Skin color, texture, turgor normal. No rashes or lesions Lymph nodes: Cervical, supraclavicular, and axillary nodes normal.  Assessment and Plan:  Type 2 diabetes mellitus, uncontrolled, with neuropathy Lost to follow up, but improved wit diet and medications.  Reminder for annual diabetic eye exam given..  Foot exam done. Meds reviewed and he is on a baby aspirin daily., a statin and an ACE inhibitor.  Urine tested for protein.    Back pain at L4-L5 level Secondary to DDD  By recent MRI lumbar spine.  Has appy wit orthopedis t Deeann Saint coming up but I havre recommendedd Pain Clnic referral. No surgical inidcations currenlty. Parameters for return to work outlined.   HTN (hypertension) Well controlled on current regimen. Renal function stable, no changes today.  Lab Results  Component Value Date   CREATININE 0.9 02/01/2014     Obesity (BMI 30-39.9) I have congratulated him in reduction of   BMI and encouraged  Continued weight loss with goal of 10% of body weigh over the next 6 months using a low glycemic index diet and regular exercise a minimum of 5 days per week.    A total of 40 minutes was spent with patient more than half of which was spent in counseling, reviewing records from other prviders and coordination of care.  Updated Medication List Outpatient Encounter Prescriptions as of 02/01/2014  Medication Sig  . ALFALFA PO Take 2 tablets by mouth 3 (three) times daily.  Marland Kitchen glipiZIDE (GLUCOTROL) 5 MG tablet TAKE ONE TABLET BY MOUTH TWICE DAILY BEFORE A MEAL  . losartan (COZAAR) 25 MG tablet TAKE ONE TABLET BY MOUTH ONCE DAILY  . metFORMIN (GLUCOPHAGE) 500 MG tablet TAKE ONE TABLET BY MOUTH TWICE DAILY WITH A MEAL  . Multiple Vitamin (MULTIVITAMIN) tablet Take 1 tablet by mouth daily.  . methocarbamol (ROBAXIN-750) 750 MG tablet Take 1 tablet (750 mg total) by mouth 4 (four) times daily.  Marland Kitchen  oxyCODONE-acetaminophen (PERCOCET/ROXICET) 5-325 MG per tablet Take 2 tablets by mouth every 4 (four) hours as needed for severe pain.  . traMADol (ULTRAM) 50 MG tablet Take 1 tablet (50 mg total) by mouth every 8 (eight) hours as needed.     Orders Placed This Encounter  Procedures  . Microalbumin / creatinine urine ratio  . CBC with Differential  . Lipid panel  . Comprehensive metabolic panel  . Hemoglobin A1c  . Ambulatory referral to Pain Clinic  . Ambulatory referral to Neurosurgery    No Follow-up on file.

## 2014-02-01 NOTE — Patient Instructions (Addendum)
You need to see me every 3 months to manage your diabetes  Depending on your A1c today, yo may require insulin therapy for either short term or long term use.   I  Want you to check your sugars twice daily: fastings and 2 hour post prandials (after a meal)along with a brief description of the meal content related to the particular blood sugars. And return in 3 weeks    Tramadol 50 mg every 8 hours as needed for pain

## 2014-02-01 NOTE — Progress Notes (Signed)
Pre-visit discussion using our clinic review tool. No additional management support is needed unless otherwise documented below in the visit note.  

## 2014-02-02 ENCOUNTER — Encounter: Payer: Self-pay | Admitting: Internal Medicine

## 2014-02-02 LAB — CBC WITH DIFFERENTIAL/PLATELET
BASOS PCT: 1.4 % (ref 0.0–3.0)
Basophils Absolute: 0.1 10*3/uL (ref 0.0–0.1)
EOS PCT: 1.2 % (ref 0.0–5.0)
Eosinophils Absolute: 0.1 10*3/uL (ref 0.0–0.7)
HCT: 48.1 % (ref 39.0–52.0)
HEMOGLOBIN: 16.7 g/dL (ref 13.0–17.0)
LYMPHS PCT: 25.1 % (ref 12.0–46.0)
Lymphs Abs: 1.5 10*3/uL (ref 0.7–4.0)
MCHC: 34.7 g/dL (ref 30.0–36.0)
MCV: 96.4 fl (ref 78.0–100.0)
MONOS PCT: 3.7 % (ref 3.0–12.0)
Monocytes Absolute: 0.2 10*3/uL (ref 0.1–1.0)
NEUTROS ABS: 4.2 10*3/uL (ref 1.4–7.7)
Neutrophils Relative %: 68.6 % (ref 43.0–77.0)
Platelets: 137 10*3/uL — ABNORMAL LOW (ref 150.0–400.0)
RBC: 4.99 Mil/uL (ref 4.22–5.81)
RDW: 13.5 % (ref 11.5–14.6)
WBC: 6.1 10*3/uL (ref 4.5–10.5)

## 2014-02-02 LAB — COMPREHENSIVE METABOLIC PANEL
ALBUMIN: 4 g/dL (ref 3.5–5.2)
ALK PHOS: 69 U/L (ref 39–117)
ALT: 28 U/L (ref 0–53)
AST: 23 U/L (ref 0–37)
BUN: 16 mg/dL (ref 6–23)
CHLORIDE: 102 meq/L (ref 96–112)
CO2: 25 mEq/L (ref 19–32)
Calcium: 9.2 mg/dL (ref 8.4–10.5)
Creatinine, Ser: 0.9 mg/dL (ref 0.4–1.5)
GFR: 95.04 mL/min (ref >60.00)
GLUCOSE: 104 mg/dL — AB (ref 70–99)
POTASSIUM: 3.9 meq/L (ref 3.5–5.1)
Sodium: 137 mEq/L (ref 135–145)
TOTAL PROTEIN: 7.2 g/dL (ref 6.0–8.3)
Total Bilirubin: 0.6 mg/dL (ref 0.3–1.2)

## 2014-02-02 LAB — LIPID PANEL
CHOLESTEROL: 182 mg/dL (ref 0–200)
HDL: 31.9 mg/dL — ABNORMAL LOW (ref >39.00)
LDL CALC: 109 mg/dL — AB (ref 0–99)
Total CHOL/HDL Ratio: 6
Triglycerides: 206 mg/dL — ABNORMAL HIGH (ref 0.0–149.0)
VLDL: 41.2 mg/dL — ABNORMAL HIGH (ref 0.0–40.0)

## 2014-02-02 LAB — MICROALBUMIN / CREATININE URINE RATIO
Creatinine,U: 91.9 mg/dL
MICROALB UR: 0.2 mg/dL (ref 0.0–1.9)
MICROALB/CREAT RATIO: 0.2 mg/g (ref 0.0–30.0)

## 2014-02-02 LAB — HEMOGLOBIN A1C: Hgb A1c MFr Bld: 5.4 % (ref 4.6–6.5)

## 2014-02-03 ENCOUNTER — Encounter: Payer: Self-pay | Admitting: Internal Medicine

## 2014-02-03 DIAGNOSIS — M48061 Spinal stenosis, lumbar region without neurogenic claudication: Secondary | ICD-10-CM | POA: Insufficient documentation

## 2014-02-03 NOTE — Assessment & Plan Note (Signed)
Lost to follow up, but improved wit diet and medications.  Reminder for annual diabetic eye exam given..  Foot exam done. Meds reviewed and he is on a baby aspirin daily., a statin and an ACE inhibitor.  Urine tested for protein.

## 2014-02-03 NOTE — Assessment & Plan Note (Signed)
Secondary to DDD  By recent MRI lumbar spine.  Has appy wit orthopedis t Deeann SaintHoward Miller coming up but I havre recommendedd Pain Clnic referral. No surgical inidcations currenlty. Parameters for return to work outlined.

## 2014-02-03 NOTE — Assessment & Plan Note (Signed)
I have congratulated him in reduction of   BMI and encouraged  Continued weight loss with goal of 10% of body weigh over the next 6 months using a low glycemic index diet and regular exercise a minimum of 5 days per week.   

## 2014-02-03 NOTE — Assessment & Plan Note (Signed)
Well controlled on current regimen. Renal function stable, no changes today.  Lab Results  Component Value Date   CREATININE 0.9 02/01/2014

## 2014-02-06 NOTE — Telephone Encounter (Signed)
Mailed unread MyChart message

## 2014-02-20 ENCOUNTER — Telehealth: Payer: Self-pay

## 2014-02-20 NOTE — Telephone Encounter (Signed)
Relevant patient education assigned to patient using Emmi. ° °

## 2014-02-24 ENCOUNTER — Other Ambulatory Visit: Payer: Self-pay | Admitting: Internal Medicine

## 2014-02-26 ENCOUNTER — Telehealth: Payer: Self-pay | Admitting: Internal Medicine

## 2014-02-26 DIAGNOSIS — IMO0002 Reserved for concepts with insufficient information to code with codable children: Secondary | ICD-10-CM | POA: Insufficient documentation

## 2014-02-28 NOTE — Telephone Encounter (Signed)
Mailed unread message to pt  

## 2014-03-01 ENCOUNTER — Telehealth: Payer: Self-pay | Admitting: Internal Medicine

## 2014-03-01 DIAGNOSIS — Z0279 Encounter for issue of other medical certificate: Secondary | ICD-10-CM

## 2014-03-01 NOTE — Telephone Encounter (Signed)
Not specific enough . I need to know what dates he is asking to be out for   Is it a two week period or 3 month period s as suggested on the form>  Ask him to read th e e mail from me and anser my question.,

## 2014-03-01 NOTE — Telephone Encounter (Signed)
FMLA form has been completed.  Please copyfor chart and charge patient $50.

## 2014-03-01 NOTE — Telephone Encounter (Signed)
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

## 2014-03-01 NOTE — Telephone Encounter (Signed)
Patient would like FMLA to start from 01/16/14 to 04/09/14 patient disability last 12 weeks ad he would like to use those 12 weeks. Please advise of question you need answered I will call patient back,

## 2014-03-02 ENCOUNTER — Ambulatory Visit: Payer: Self-pay | Admitting: Pain Medicine

## 2014-03-02 NOTE — Telephone Encounter (Signed)
Left message for patient to return call.

## 2014-03-02 NOTE — Telephone Encounter (Signed)
Patient notified fmla ready for pick up.

## 2014-03-02 NOTE — Telephone Encounter (Signed)
Copy made for chart , original placed up front for pick up and faxed for patient tried to reach patient for pick up but no answer or voicemail.

## 2014-03-13 ENCOUNTER — Ambulatory Visit: Payer: Self-pay | Admitting: Pain Medicine

## 2014-04-11 ENCOUNTER — Ambulatory Visit: Payer: Self-pay | Admitting: Pain Medicine

## 2014-04-24 ENCOUNTER — Telehealth: Payer: Self-pay | Admitting: Internal Medicine

## 2014-04-24 NOTE — Telephone Encounter (Signed)
Patient dropped off FMLA form to be filled out. Please call when ready. Form in Dr. Melina Schoolsullo's box/msn

## 2014-04-24 NOTE — Telephone Encounter (Signed)
In red folder. 

## 2014-04-25 NOTE — Telephone Encounter (Signed)
Patient notified and forms placed up front for pick up. 

## 2014-04-25 NOTE — Telephone Encounter (Signed)
The red folder does not contain the FMLA form.  It contains the form from [place]/[employer] that the patient needs to fill out and return to [employer] stating that he agrees to the $25 fee payable to [employer] for copying his medical records.   I have already done his FMLA

## 2014-04-26 ENCOUNTER — Ambulatory Visit: Payer: Self-pay | Admitting: Pain Medicine

## 2014-04-30 ENCOUNTER — Encounter: Payer: Self-pay | Admitting: Internal Medicine

## 2014-05-09 ENCOUNTER — Ambulatory Visit: Payer: Self-pay | Admitting: Pain Medicine

## 2014-05-10 ENCOUNTER — Ambulatory Visit (INDEPENDENT_AMBULATORY_CARE_PROVIDER_SITE_OTHER): Payer: BC Managed Care – PPO | Admitting: Internal Medicine

## 2014-05-10 ENCOUNTER — Encounter: Payer: Self-pay | Admitting: Internal Medicine

## 2014-05-10 VITALS — BP 126/82 | HR 80 | Temp 98.7°F | Resp 16 | Ht 70.0 in | Wt 248.0 lb

## 2014-05-10 DIAGNOSIS — E1149 Type 2 diabetes mellitus with other diabetic neurological complication: Secondary | ICD-10-CM

## 2014-05-10 DIAGNOSIS — Z01818 Encounter for other preprocedural examination: Secondary | ICD-10-CM

## 2014-05-10 DIAGNOSIS — M48061 Spinal stenosis, lumbar region without neurogenic claudication: Secondary | ICD-10-CM

## 2014-05-10 DIAGNOSIS — L22 Diaper dermatitis: Secondary | ICD-10-CM

## 2014-05-10 DIAGNOSIS — B372 Candidiasis of skin and nail: Secondary | ICD-10-CM

## 2014-05-10 DIAGNOSIS — R635 Abnormal weight gain: Secondary | ICD-10-CM

## 2014-05-10 DIAGNOSIS — E669 Obesity, unspecified: Secondary | ICD-10-CM

## 2014-05-10 DIAGNOSIS — E119 Type 2 diabetes mellitus without complications: Secondary | ICD-10-CM

## 2014-05-10 MED ORDER — NYSTATIN 100000 UNIT/GM EX CREA: 1.0000 "application " | TOPICAL_CREAM | Freq: Two times a day (BID) | CUTANEOUS | Status: DC

## 2014-05-10 NOTE — Progress Notes (Signed)
Patient ID: Steven Holmes, male   DOB: 02/28/1961, 53 y.o.   MRN: [id number]  Patient Active Problem List   Diagnosis Date Noted  . Lower extremity venous stasis 05/13/2012    Priority: High  . Preoperative evaluation of a medical condition to rule out surgical contraindications (TAR required) 05/13/2014  . Candidiasis, intertriginous 05/13/2014  . Degenerative disk disease 02/26/2014  . Lumbar spinal stenosis 02/03/2014  . Type II or unspecified type diabetes mellitus with neurological manifestations, not stated as uncontrolled 05/22/2013  . Allergic rhinitis 12/25/2012  . Dyspnea 08/17/2012  . HTN (hypertension) 05/13/2012  . OSA (obstructive sleep apnea) 05/13/2012  . Obesity (BMI 30-39.9) 05/13/2012  . Diverticulosis     Subjective:  CC:   Chief Complaint  Patient presents with  . Follow-up    3 month  . Diabetes    HPI:   Steven ReidKenneth Durand Hofland is a 53 y.o. male who presents for Follow up on multiple issues.  DM type 2.  He did not bring a log of his blood sugars with him today, but recalls that his post prandial breakfast sugar today  Was 110 and last week was  134   He is also needing  medical clearance for facet joint injections since last his last epidural  injection resuled in a  Near syncopal event .  He has no history of CAD or prior syncopal events.   Itchy rash in pannus for the past month aggravated by his belt buckle .  Needs 90 day refills on everything  Wants handicapped sticker due to difficulty walking .  He is currently on short term disability   Past Medical History  Diagnosis Date  . Hypertension   . Diverticulosis     Past Surgical History  Procedure Laterality Date  . Hernia repair  2009    [facility]  . Vasectomy         The following portions of the patient's history were reviewed and updated as appropriate: Allergies, current medications, and problem list.    Review of Systems:   Patient denies  headache, fevers, malaise, unintentional weight loss, skin rash, eye pain, sinus congestion and sinus pain, sore throat, dysphagia,  hemoptysis , cough, dyspnea, wheezing, chest pain, palpitations, orthopnea, edema, abdominal pain, nausea, melena, diarrhea, constipation, flank pain, dysuria, hematuria, urinary  Frequency, nocturia, numbness, tingling, seizures,  Focal weakness, Loss of consciousness,  Tremor, insomnia, depression, anxiety, and suicidal ideation.     History   Social History  . Marital Status: Married    Spouse Name: N/A    Number of Children: N/A  . Years of Education: N/A   Occupational History  . Not on file.   Social History Main Topics  . Smoking status: Former Smoker -- 1.00 packs/day for 25 years    Types: Cigarettes    Quit date: 11/03/2000  . Smokeless tobacco: Never Used  . Alcohol Use: No  . Drug Use: No  . Sexual Activity: Not on file   Other Topics Concern  . Not on file   Social History Narrative  . No narrative on file    Objective:  Filed Vitals:   05/10/14 1425  BP: 126/82  Pulse: 80  Temp: 98.7 F (37.1 C)  Resp: 16     General appearance: alert, cooperative and appears stated age Ears: normal TM's and external ear canals both ears Throat: lips, mucosa, and tongue normal; teeth and gums normal Neck: no adenopathy, no carotid  bruit, supple, symmetrical, trachea midline and thyroid not enlarged, symmetric, no tenderness/mass/nodules Back: symmetric, no curvature. ROM normal. No CVA tenderness. Lungs: clear to auscultation bilaterally Heart: regular rate and rhythm, S1, S2 normal, no murmur, click, rub or gallop Abdomen: soft, non-tender; bowel sounds normal; no masses,  no organomegaly Pulses: 2+ and symmetric Skin: Skin color, texture, turgor normal. No rashes or lesions Lymph nodes: Cervical, supraclavicular, and axillary nodes normal.  Assessment and Plan:  Preoperative evaluation of a medical condition to rule out surgical  contraindications (TAR required) Patient  is considered to be at low risk  For perioperative complications  Based on today's exam and history and is cleared for procedure  Lumbar spinal stenosis secondary to L4 disk herniation.  He is unable t work due to severe pain and is undergoing conservative treatment with facet injections.  Dr. Metta Clinesrisp to assume responsibility for writing narcotics prescriptions going forward.   Type II or unspecified type diabetes mellitus with neurological manifestations, not stated as uncontrolled Improving control with low GI diet and exercise .  hemoglobin A1c is due . Patient is referred for  eye exam and foot exam was done today.  There is  no proteinuria on prior micro urinalysis .  Fasting lipids will be repeated  and statin therapy advised if indicated by application of new ACC guidelines based on patient's 10 year risk of CAD    Lab Results  Component Value Date   HGBA1C 5.4 05/10/2014   Lab Results  Component Value Date   MICROALBUR 0.2 05/10/2014     Obesity (BMI 30-39.9) I have addressed  BMI and recommended wt loss of 10% of body weigh over the next 6 months using a low glycemic index diet and regular exercise a minimum of 5 days per week.    Candidiasis, intertriginous Nystatin cream prescribed bid.    A total of 40 minutes was spent with patient more than half of which was spent in counseling, reviewing records from other prviders and coordination of care.  Updated Medication List Outpatient Encounter Prescriptions as of 05/10/2014  Medication Sig  . ALFALFA PO Take 2 tablets by mouth 3 (three) times daily.  Marland Kitchen. glipiZIDE (GLUCOTROL) 5 MG tablet TAKE ONE TABLET BY MOUTH TWICE DAILY BEFORE A MEAL  . losartan (COZAAR) 25 MG tablet TAKE ONE TABLET BY MOUTH ONCE DAILY  . metFORMIN (GLUCOPHAGE) 500 MG tablet TAKE ONE TABLET BY MOUTH TWICE DAILY WITH A MEAL  . methocarbamol (ROBAXIN-750) 750 MG tablet Take 1 tablet (750 mg total) by mouth 4 (four) times  daily.  . Multiple Vitamin (MULTIVITAMIN) tablet Take 1 tablet by mouth daily.  Marland Kitchen. nystatin cream (MYCOSTATIN) Apply 1 application topically 2 (two) times daily. Until rash has resolved  . oxyCODONE-acetaminophen (PERCOCET/ROXICET) 5-325 MG per tablet Take 2 tablets by mouth every 4 (four) hours as needed for severe pain.  . traMADol (ULTRAM) 50 MG tablet Take 1 tablet (50 mg total) by mouth every 8 (eight) hours as needed.     Orders Placed This Encounter  Procedures  . Hemoglobin A1c  . Microalbumin / creatinine urine ratio  . Comprehensive metabolic panel  . T4 AND TSH    No Follow-up on file.

## 2014-05-10 NOTE — Patient Instructions (Signed)
Cutaneous Candidiasis Cutaneous candidiasis is a condition in which there is an overgrowth of yeast (candida) on the skin. Yeast normally live on the skin, but in small enough numbers not to cause any symptoms. In certain cases, increased growth of the yeast may cause an actual yeast infection. This kind of infection usually occurs in areas of the skin that are constantly warm and moist, such as the armpits or the groin. Yeast is the most common cause of diaper rash in babies and in people who cannot control their bowel movements (incontinence). CAUSES  The fungus that most often causes cutaneous candidiasis is Candida albicans. Conditions that can increase the risk of getting a yeast infection of the skin include:  Obesity.  Pregnancy.  Diabetes.  Taking antibiotic medicine.  Taking birth control pills.  Taking steroid medicines.  Thyroid disease.  An iron or zinc deficiency.  Problems with the immune system. SYMPTOMS   Red, swollen area of the skin.  Bumps on the skin.  Itchiness. DIAGNOSIS  The diagnosis of cutaneous candidiasis is usually based on its appearance. Light scrapings of the skin may also be taken and viewed under a microscope to identify the presence of yeast. TREATMENT  Antifungal creams may be applied to the infected skin. In severe cases, oral medicines may be needed.  HOME CARE INSTRUCTIONS   Keep your skin clean and dry.  Maintain a healthy weight.  If you have diabetes, keep your blood sugar under control. SEEK IMMEDIATE MEDICAL CARE IF:  Your rash continues to spread despite treatment.  You have a fever, chills, or abdominal pain. Document Released: 07/08/2011 Document Revised: 01/12/2012 Document Reviewed: 07/08/2011 ExitCare Patient Information 2015 [employer]. This information is not intended to replace advice given to you by your health care provider. Make sure you discuss any questions you have with your health care provider.  

## 2014-05-10 NOTE — Progress Notes (Signed)
Pre-visit discussion using our clinic review tool. No additional management support is needed unless otherwise documented below in the visit note.  

## 2014-05-11 LAB — COMPREHENSIVE METABOLIC PANEL
ALK PHOS: 68 U/L (ref 39–117)
ALT: 30 U/L (ref 0–53)
AST: 22 U/L (ref 0–37)
Albumin: 4.2 g/dL (ref 3.5–5.2)
BILIRUBIN TOTAL: 0.6 mg/dL (ref 0.2–1.2)
BUN: 15 mg/dL (ref 6–23)
CO2: 26 mEq/L (ref 19–32)
Calcium: 9.6 mg/dL (ref 8.4–10.5)
Chloride: 105 mEq/L (ref 96–112)
Creatinine, Ser: 0.9 mg/dL (ref 0.4–1.5)
GFR: 98.78 mL/min (ref >60.00)
Glucose, Bld: 75 mg/dL (ref 70–99)
Potassium: 4.1 mEq/L (ref 3.5–5.1)
SODIUM: 139 meq/L (ref 135–145)
TOTAL PROTEIN: 7.2 g/dL (ref 6.0–8.3)

## 2014-05-11 LAB — MICROALBUMIN / CREATININE URINE RATIO
CREATININE, U: 105.1 mg/dL
MICROALB UR: 0.2 mg/dL (ref 0.0–1.9)
Microalb Creat Ratio: 0.2 mg/g (ref 0.0–30.0)

## 2014-05-11 LAB — HEMOGLOBIN A1C: HEMOGLOBIN A1C: 5.4 % (ref 4.6–6.5)

## 2014-05-11 LAB — T4 AND TSH
T4 TOTAL: 8 ug/dL (ref 4.5–12.0)
TSH: 3.74 u[IU]/mL (ref 0.450–4.500)

## 2014-05-13 ENCOUNTER — Encounter: Payer: Self-pay | Admitting: Internal Medicine

## 2014-05-13 DIAGNOSIS — B372 Candidiasis of skin and nail: Secondary | ICD-10-CM | POA: Insufficient documentation

## 2014-05-13 DIAGNOSIS — Z01818 Encounter for other preprocedural examination: Secondary | ICD-10-CM | POA: Insufficient documentation

## 2014-05-13 NOTE — Assessment & Plan Note (Signed)
Improving control with low GI diet and exercise .  hemoglobin A1c is due . Patient is referred for  eye exam and foot exam was done today.  There is  no proteinuria on prior micro urinalysis .  Fasting lipids will be repeated  and statin therapy advised if indicated by application of new ACC guidelines based on patient's 10 year risk of CAD    Lab Results  Component Value Date   HGBA1C 5.4 05/10/2014   Lab Results  Component Value Date   MICROALBUR 0.2 05/10/2014

## 2014-05-13 NOTE — Assessment & Plan Note (Signed)
I have addressed  BMI and recommended wt loss of 10% of body weigh over the next 6 months using a low glycemic index diet and regular exercise a minimum of 5 days per week.   

## 2014-05-13 NOTE — Assessment & Plan Note (Signed)
Nystatin cream prescribed bid.

## 2014-05-13 NOTE — Assessment & Plan Note (Signed)
Patient  is considered to be at low risk  For perioperative complications  Based on today's exam and history and is cleared for procedure

## 2014-05-13 NOTE — Assessment & Plan Note (Addendum)
secondary to L4 disk herniation.  He is unable t work due to severe pain and is undergoing conservative treatment with facet injections.  Dr. Metta Clinesrisp to assume responsibility for writing narcotics prescriptions going forward.

## 2014-05-14 ENCOUNTER — Encounter: Payer: Self-pay | Admitting: Internal Medicine

## 2014-05-15 ENCOUNTER — Encounter: Payer: Self-pay | Admitting: Internal Medicine

## 2014-05-15 NOTE — Progress Notes (Signed)
Faxed patient clearance for Lumbar procedure to Dr. Metta Clinesrisp office with OV notes from 05/11/14.

## 2014-05-17 ENCOUNTER — Ambulatory Visit: Payer: Self-pay | Admitting: Pain Medicine

## 2014-05-18 NOTE — Telephone Encounter (Signed)
Mailed unread message to pt  

## 2014-05-24 ENCOUNTER — Other Ambulatory Visit: Payer: Self-pay | Admitting: Internal Medicine

## 2014-06-12 ENCOUNTER — Telehealth: Payer: Self-pay | Admitting: Internal Medicine

## 2014-06-12 NOTE — Telephone Encounter (Signed)
Placed in red folder to be reviewed for work note.

## 2014-06-12 NOTE — Telephone Encounter (Signed)
Pt came in today and stated would like to have a work note written for him to be till October till he comes back to his next appt. Dropped off notes from Dr. Ewing SchleinGregory Crisp. Placed in Dr. Melina Schoolsullo's box.

## 2014-06-15 ENCOUNTER — Telehealth: Payer: Self-pay | Admitting: Internal Medicine

## 2014-06-15 NOTE — Telephone Encounter (Signed)
Work Physicist, medicalletter on Counsell[employer]inter

## 2014-06-15 NOTE — Telephone Encounter (Signed)
Spoke with pt, advised letter ready for pick up.

## 2014-06-20 ENCOUNTER — Ambulatory Visit: Payer: Self-pay | Admitting: Pain Medicine

## 2014-06-28 ENCOUNTER — Ambulatory Visit: Payer: Self-pay | Admitting: Pain Medicine

## 2014-07-03 ENCOUNTER — Other Ambulatory Visit: Payer: Self-pay | Admitting: Internal Medicine

## 2014-07-10 LAB — HM DIABETES EYE EXAM

## 2014-07-18 ENCOUNTER — Ambulatory Visit: Payer: Self-pay | Admitting: Pain Medicine

## 2014-08-10 ENCOUNTER — Other Ambulatory Visit: Payer: Self-pay | Admitting: Internal Medicine

## 2014-08-15 ENCOUNTER — Ambulatory Visit (INDEPENDENT_AMBULATORY_CARE_PROVIDER_SITE_OTHER): Payer: BC Managed Care – PPO | Admitting: Internal Medicine

## 2014-08-15 ENCOUNTER — Encounter: Payer: Self-pay | Admitting: Internal Medicine

## 2014-08-15 VITALS — BP 126/84 | HR 97 | Temp 98.4°F | Resp 16 | Ht 70.0 in | Wt 245.5 lb

## 2014-08-15 DIAGNOSIS — Z23 Encounter for immunization: Secondary | ICD-10-CM

## 2014-08-15 DIAGNOSIS — G4733 Obstructive sleep apnea (adult) (pediatric): Secondary | ICD-10-CM

## 2014-08-15 DIAGNOSIS — M4806 Spinal stenosis, lumbar region: Secondary | ICD-10-CM

## 2014-08-15 DIAGNOSIS — E669 Obesity, unspecified: Secondary | ICD-10-CM

## 2014-08-15 DIAGNOSIS — E119 Type 2 diabetes mellitus without complications: Secondary | ICD-10-CM

## 2014-08-15 DIAGNOSIS — M48061 Spinal stenosis, lumbar region without neurogenic claudication: Secondary | ICD-10-CM

## 2014-08-15 DIAGNOSIS — I10 Essential (primary) hypertension: Secondary | ICD-10-CM

## 2014-08-15 NOTE — Assessment & Plan Note (Signed)
Well controlled on current regimen. Renal function stable, no changes today. 

## 2014-08-15 NOTE — Assessment & Plan Note (Signed)
I have addressed  BMI and recommended wt loss of 10% of body weigh over the next 6 months using a low glycemic index diet and regular exercise a minimum of 5 days per week.   

## 2014-08-15 NOTE — Assessment & Plan Note (Signed)
Diagnosed by sleep study. he is wearing her CPAP every night a minimum of 6 hours per night and notes improved daytime wakefulness and decreased fatigue  

## 2014-08-15 NOTE — Patient Instructions (Addendum)
You are doing well.  And losing weight!  Keep it up1  Return for fasting labs ASAP   I will be happy to refer you to Neurosurgery for you spinal stenosis if you pain is uncontrolled

## 2014-08-15 NOTE — Assessment & Plan Note (Signed)
Improving control with low GI diet and exercise .  hemoglobin A1c is due . Patient is  Up to date on  eye exam and foot exam was done today.  There is  no proteinuria on prior micro urinalysis .  Fasting lipids will be repeated  and statin therapy advised if indicated by application of new ACC guidelines based on patient's 10 year risk of CAD    Lab Results  Component Value Date   HGBA1C 5.4 05/10/2014   Lab Results  Component Value Date   MICROALBUR 0.2 05/10/2014

## 2014-08-15 NOTE — Assessment & Plan Note (Signed)
MRI showing herniation of L4 disk small, left lateral   And central spinal canal stenosis from L3 to L5 . Pain Clinic referral  Has been done and intervention with facet joint injections has not provided relief.  He has not been able to exercise regularly due to pai aggravated by walking.

## 2014-08-15 NOTE — Progress Notes (Signed)
Patient ID: Steven ReidKenneth Durand Song, male   DOB: 06/21/1961, 53 y.o.   MRN: [id number]   Patient Active Problem List   Diagnosis Date Noted  . Lower extremity venous stasis 05/13/2012    Priority: High  . Preoperative evaluation of a medical condition to rule out surgical contraindications (TAR required) 05/13/2014  . Candidiasis, intertriginous 05/13/2014  . Degenerative disk disease 02/26/2014  . Lumbar spinal stenosis 02/03/2014  . DM type 2 (diabetes mellitus, type 2) 05/22/2013  . Allergic rhinitis 12/25/2012  . Dyspnea 08/17/2012  . HTN (hypertension) 05/13/2012  . OSA (obstructive sleep apnea) 05/13/2012  . Obesity (BMI 30-39.9) 05/13/2012  . Diverticulosis     Subjective:  CC:   Chief Complaint  Patient presents with  . Follow-up    3 month  . Diabetes  . Hypertension    HPI: Follow up on DM Type 2 with obesity, OSA and DDD   Back pain not improving with treatment using facet blocks by Dr Metta Clinesrisp ,  Last one 3 to 4 weeks ago .  Having SI joint involvement now with bilateral hip pain .  Spinal  Stenosis by lumbar MRI .  Has not received any disability checks bc Dr Metta Clinesrisp  has not supplied them the information  Needed for a prognosis of recovery  Not a workmen's comp issues .    DM follow up: last sugar was 137 between meals, post prandial .  Fastings not checked.    Steven Holmes is a 53 y.o. male who presents for   Past Medical History  Diagnosis Date  . Hypertension   . Diverticulosis     Past Surgical History  Procedure Laterality Date  . Hernia repair  2009    [facility]  . Vasectomy         The following portions of the patient's history were reviewed and updated as appropriate: Allergies, current medications, and problem list.    Review of Systems:   Patient denies headache, fevers, malaise, unintentional weight loss, skin rash, eye pain, sinus congestion and sinus pain, sore throat, dysphagia,  hemoptysis , cough,  dyspnea, wheezing, chest pain, palpitations, orthopnea, edema, abdominal pain, nausea, melena, diarrhea, constipation, flank pain, dysuria, hematuria, urinary  Frequency, nocturia, numbness, tingling, seizures,  Focal weakness, Loss of consciousness,  Tremor, insomnia, depression, anxiety, and suicidal ideation.     History   Social History  . Marital Status: Married    Spouse Name: N/A    Number of Children: N/A  . Years of Education: N/A   Occupational History  . Not on file.   Social History Main Topics  . Smoking status: Former Smoker -- 1.00 packs/day for 25 years    Types: Cigarettes    Quit date: 11/03/2000  . Smokeless tobacco: Never Used  . Alcohol Use: No  . Drug Use: No  . Sexual Activity: Not on file   Other Topics Concern  . Not on file   Social History Narrative  . No narrative on file    Objective:  Filed Vitals:   08/15/14 1323  BP: 126/84  Pulse: 97  Temp: 98.4 F (36.9 C)  Resp: 16     General appearance: alert, cooperative and appears stated age Ears: normal TM's and external ear canals both ears Throat: lips, mucosa, and tongue normal; teeth and gums normal Neck: no adenopathy, no carotid bruit, supple, symmetrical, trachea midline and thyroid not enlarged, symmetric, no tenderness/mass/nodules Back: symmetric, no curvature. ROM normal.  No CVA tenderness. Lungs: clear to auscultation bilaterally Heart: regular rate and rhythm, S1, S2 normal, no murmur, click, rub or gallop Abdomen: soft, non-tender; bowel sounds normal; no masses,  no organomegaly Pulses: 2+ and symmetric Skin: Skin color, texture, turgor normal. No rashes or lesions Lymph nodes: Cervical, supraclavicular, and axillary nodes normal.  Assessment and Plan:  DM type 2 (diabetes mellitus, type 2) Improving control with low GI diet and exercise .  hemoglobin A1c is due . Patient is  Up to date on  eye exam and foot exam was done today.  There is  no proteinuria on prior micro  urinalysis .  Fasting lipids will be repeated  and statin therapy advised if indicated by application of new ACC guidelines based on patient's 10 year risk of CAD    Lab Results  Component Value Date   HGBA1C 5.4 05/10/2014   Lab Results  Component Value Date   MICROALBUR 0.2 05/10/2014       HTN (hypertension) Well controlled on current regimen. Renal function stable, no changes today.  Lumbar spinal stenosis MRI showing herniation of L4 disk small, left lateral   And central spinal canal stenosis from L3 to L5 . Pain Clinic referral  Has been done and intervention with facet joint injections has not provided relief.  He has not been able to exercise regularly due to pai aggravated by walking.    Obesity (BMI 30-39.9) I have addressed  BMI and recommended wt loss of 10% of body weigh over the next 6 months using a low glycemic index diet and regular exercise a minimum of 5 days per week.     Updated Medication List Outpatient Encounter Prescriptions as of 08/15/2014  Medication Sig  . ALFALFA PO Take 2 tablets by mouth 3 (three) times daily.  Marland Kitchen. glipiZIDE (GLUCOTROL) 5 MG tablet TAKE ONE TABLET BY MOUTH TWICE DAILY BEFORE A MEAL  . losartan (COZAAR) 25 MG tablet TAKE ONE TABLET BY MOUTH ONCE DAILY  . metFORMIN (GLUCOPHAGE) 500 MG tablet TAKE ONE TABLET BY MOUTH TWICE DAILY WITH A MEAL  . Multiple Vitamin (MULTIVITAMIN) tablet Take 1 tablet by mouth daily.  Marland Kitchen. nystatin cream (MYCOSTATIN) Apply 1 application topically 2 (two) times daily. Until rash has resolved  . oxyCODONE-acetaminophen (PERCOCET/ROXICET) 5-325 MG per tablet Take 2 tablets by mouth every 4 (four) hours as needed for severe pain.  . traMADol (ULTRAM) 50 MG tablet Take 1 tablet (50 mg total) by mouth every 8 (eight) hours as needed.  . methocarbamol (ROBAXIN-750) 750 MG tablet Take 1 tablet (750 mg total) by mouth 4 (four) times daily.     Orders Placed This Encounter  Procedures  . Comprehensive metabolic panel   . Hemoglobin A1c  . Lipid panel  . Microalbumin / creatinine urine ratio  . HM DIABETES EYE EXAM    Return in about 6 months (around 02/14/2015) for follow up diabetes.

## 2014-08-15 NOTE — Progress Notes (Signed)
Pre-visit discussion using our clinic review tool. No additional management support is needed unless otherwise documented below in the visit note.  

## 2014-09-07 ENCOUNTER — Other Ambulatory Visit (INDEPENDENT_AMBULATORY_CARE_PROVIDER_SITE_OTHER): Payer: BC Managed Care – PPO

## 2014-09-07 DIAGNOSIS — E119 Type 2 diabetes mellitus without complications: Secondary | ICD-10-CM

## 2014-09-07 LAB — COMPREHENSIVE METABOLIC PANEL
ALBUMIN: 3.6 g/dL (ref 3.5–5.2)
ALT: 27 U/L (ref 0–53)
AST: 24 U/L (ref 0–37)
Alkaline Phosphatase: 72 U/L (ref 39–117)
BUN: 15 mg/dL (ref 6–23)
CALCIUM: 9.3 mg/dL (ref 8.4–10.5)
CHLORIDE: 104 meq/L (ref 96–112)
CO2: 28 mEq/L (ref 19–32)
CREATININE: 1.1 mg/dL (ref 0.4–1.5)
GFR: 77.5 mL/min (ref >60.00)
GLUCOSE: 126 mg/dL — AB (ref 70–99)
POTASSIUM: 4.3 meq/L (ref 3.5–5.1)
Sodium: 138 mEq/L (ref 135–145)
Total Bilirubin: 1 mg/dL (ref 0.2–1.2)
Total Protein: 7.2 g/dL (ref 6.0–8.3)

## 2014-09-07 LAB — LIPID PANEL
CHOL/HDL RATIO: 7
CHOLESTEROL: 205 mg/dL — AB (ref 0–200)
HDL: 28.7 mg/dL — ABNORMAL LOW (ref >39.00)
LDL CALC: 141 mg/dL — AB (ref 0–99)
NonHDL: 176.3
Triglycerides: 178 mg/dL — ABNORMAL HIGH (ref 0.0–149.0)
VLDL: 35.6 mg/dL (ref 0.0–40.0)

## 2014-09-07 LAB — MICROALBUMIN / CREATININE URINE RATIO
CREATININE, U: 318.2 mg/dL
Microalb Creat Ratio: 0.2 mg/g (ref 0.0–30.0)
Microalb, Ur: 0.5 mg/dL (ref 0.0–1.9)

## 2014-09-07 LAB — HEMOGLOBIN A1C: HEMOGLOBIN A1C: 5.1 % (ref 4.6–6.5)

## 2014-09-08 ENCOUNTER — Other Ambulatory Visit: Payer: Self-pay | Admitting: Internal Medicine

## 2014-09-10 ENCOUNTER — Encounter: Payer: Self-pay | Admitting: Internal Medicine

## 2014-09-13 ENCOUNTER — Other Ambulatory Visit: Payer: Self-pay | Admitting: Internal Medicine

## 2014-10-08 ENCOUNTER — Other Ambulatory Visit: Payer: Self-pay | Admitting: Internal Medicine

## 2014-11-17 ENCOUNTER — Other Ambulatory Visit: Payer: Self-pay | Admitting: Internal Medicine

## 2014-12-20 ENCOUNTER — Other Ambulatory Visit: Payer: Self-pay | Admitting: Internal Medicine

## 2015-02-01 ENCOUNTER — Other Ambulatory Visit: Payer: Self-pay | Admitting: Internal Medicine

## 2015-02-13 ENCOUNTER — Encounter: Payer: Self-pay | Admitting: Internal Medicine

## 2015-02-13 ENCOUNTER — Ambulatory Visit (INDEPENDENT_AMBULATORY_CARE_PROVIDER_SITE_OTHER): Payer: Self-pay | Admitting: Internal Medicine

## 2015-02-13 VITALS — BP 128/92 | HR 96 | Temp 99.1°F | Resp 16 | Ht 70.0 in | Wt 259.8 lb

## 2015-02-13 DIAGNOSIS — E119 Type 2 diabetes mellitus without complications: Secondary | ICD-10-CM

## 2015-02-13 DIAGNOSIS — E1169 Type 2 diabetes mellitus with other specified complication: Secondary | ICD-10-CM

## 2015-02-13 DIAGNOSIS — E785 Hyperlipidemia, unspecified: Secondary | ICD-10-CM

## 2015-02-13 DIAGNOSIS — I10 Essential (primary) hypertension: Secondary | ICD-10-CM

## 2015-02-13 DIAGNOSIS — G4733 Obstructive sleep apnea (adult) (pediatric): Secondary | ICD-10-CM

## 2015-02-13 DIAGNOSIS — E669 Obesity, unspecified: Secondary | ICD-10-CM

## 2015-02-13 DIAGNOSIS — I878 Other specified disorders of veins: Secondary | ICD-10-CM

## 2015-02-13 LAB — COMPREHENSIVE METABOLIC PANEL
ALBUMIN: 4.3 g/dL (ref 3.5–5.2)
ALK PHOS: 83 U/L (ref 39–117)
ALT: 31 U/L (ref 0–53)
AST: 25 U/L (ref 0–37)
BUN: 18 mg/dL (ref 6–23)
CALCIUM: 9.7 mg/dL (ref 8.4–10.5)
CHLORIDE: 102 meq/L (ref 96–112)
CO2: 30 mEq/L (ref 19–32)
Creatinine, Ser: 0.95 mg/dL (ref 0.40–1.50)
GFR: 87.81 mL/min (ref >60.00)
Glucose, Bld: 76 mg/dL (ref 70–99)
POTASSIUM: 4.3 meq/L (ref 3.5–5.1)
SODIUM: 136 meq/L (ref 135–145)
TOTAL PROTEIN: 7.3 g/dL (ref 6.0–8.3)
Total Bilirubin: 0.6 mg/dL (ref 0.2–1.2)

## 2015-02-13 LAB — LIPID PANEL
Cholesterol: 200 mg/dL (ref 0–200)
HDL: 37.2 mg/dL — ABNORMAL LOW (ref >39.00)
NonHDL: 162.8
Total CHOL/HDL Ratio: 5
Triglycerides: 206 mg/dL — ABNORMAL HIGH (ref 0.0–149.0)
VLDL: 41.2 mg/dL — AB (ref 0.0–40.0)

## 2015-02-13 LAB — LDL CHOLESTEROL, DIRECT: Direct LDL: 142 mg/dL

## 2015-02-13 LAB — HEMOGLOBIN A1C: Hgb A1c MFr Bld: 5.7 % (ref 4.6–6.5)

## 2015-02-13 MED ORDER — TRAMADOL HCL 50 MG PO TABS: 50.0000 mg | ORAL_TABLET | Freq: Three times a day (TID) | ORAL | Status: DC | PRN

## 2015-02-13 MED ORDER — FUROSEMIDE 20 MG PO TABS: 20.0000 mg | ORAL_TABLET | Freq: Every day | ORAL | Status: DC

## 2015-02-13 NOTE — Patient Instructions (Addendum)
I am adding furosemide ,  A fluid pill to take as needed (not daily) for fluid retention.,  It will make you urinate a lot!  You may require a potassium supplement with it.  i will let you know  You can also try wearing compressiion knee highs to managed the fluid  It may be due to your sleep apnea putting a strain on your heart.   Losing weight  And walking are advised,  Both may help improve your sleep apnea  Sleep Apnea  Sleep apnea is a sleep disorder characterized by abnormal pauses in breathing while you sleep. When your breathing pauses, the level of oxygen in your blood decreases. This causes you to move out of deep sleep and into light sleep. As a result, your quality of sleep is poor, and the system that carries your blood throughout your body (cardiovascular system) experiences stress. If sleep apnea remains untreated, the following conditions can develop:  High blood pressure (hypertension).  Coronary artery disease.  Inability to achieve or maintain an erection (impotence).  Impairment of your thought process (cognitive dysfunction). There are three types of sleep apnea: 1. Obstructive sleep apnea--Pauses in breathing during sleep because of a blocked airway. 2. Central sleep apnea--Pauses in breathing during sleep because the area of the brain that controls your breathing does not send the correct signals to the muscles that control breathing. 3. Mixed sleep apnea--A combination of both obstructive and central sleep apnea. RISK FACTORS The following risk factors can increase your risk of developing sleep apnea:  Being overweight.  Smoking.  Having narrow passages in your nose and throat.  Being of older age.  Being male.  Alcohol use.  Sedative and tranquilizer use.  Ethnicity. Among individuals younger than 35 years, African Americans are at increased risk of sleep apnea. SYMPTOMS   Difficulty staying asleep.  Daytime sleepiness and fatigue.  Loss of  energy.  Irritability.  Loud, heavy snoring.  Morning headaches.  Trouble concentrating.  Forgetfulness.  Decreased interest in sex. DIAGNOSIS  In order to diagnose sleep apnea, your caregiver will perform a physical examination. Your caregiver may suggest that you take a home sleep test. Your caregiver may also recommend that you spend the night in a sleep lab. In the sleep lab, several monitors record information about your heart, lungs, and brain while you sleep. Your leg and arm movements and blood oxygen level are also recorded. TREATMENT The following actions may help to resolve mild sleep apnea:  Sleeping on your side.   Using a decongestant if you have nasal congestion.   Avoiding the use of depressants, including alcohol, sedatives, and narcotics.   Losing weight and modifying your diet if you are overweight. There also are devices and treatments to help open your airway:  Oral appliances. These are custom-made mouthpieces that shift your lower jaw forward and slightly open your bite. This opens your airway.  Devices that create positive airway pressure. This positive pressure "splints" your airway open to help you breathe better during sleep. The following devices create positive airway pressure:  Continuous positive airway pressure (CPAP) device. The CPAP device creates a continuous level of air pressure with an air pump. The air is delivered to your airway through a mask while you sleep. This continuous pressure keeps your airway open.  Nasal expiratory positive airway pressure (EPAP) device. The EPAP device creates positive air pressure as you exhale. The device consists of single-use valves, which are inserted into each nostril and held  in place by adhesive. The valves create very little resistance when you inhale but create much more resistance when you exhale. That increased resistance creates the positive airway pressure. This positive pressure while you exhale  keeps your airway open, making it easier to breath when you inhale again.  Bilevel positive airway pressure (BPAP) device. The BPAP device is used mainly in patients with central sleep apnea. This device is similar to the CPAP device because it also uses an air pump to deliver continuous air pressure through a mask. However, with the BPAP machine, the pressure is set at two different levels. The pressure when you exhale is lower than the pressure when you inhale.  Surgery. Typically, surgery is only done if you cannot comply with less invasive treatments or if the less invasive treatments do not improve your condition. Surgery involves removing excess tissue in your airway to create a wider passage way. Document Released: 10/10/2002 Document Revised: 02/14/2013 Document Reviewed: 02/26/2012 [facility] Patient Information 2015 [place], [place]. This information is not intended to replace advice given to you by your health care provider. Make sure you discuss any questions you have with your health care provider.

## 2015-02-13 NOTE — Assessment & Plan Note (Addendum)
diagnosed with prior sleep study but treatment has been deferred d by patient due to mask intolerance .  Discussed the long term history of OSA , the risks of long term damage to heart and the signs and symptoms he now has that are  attributable to OSA.  Advised patient to consider  significant weight loss and/or use of CPAP. Patient has never treated his OSA due to intolerance of CPAP he is currently uninsured,

## 2015-02-13 NOTE — Progress Notes (Signed)
Pre-visit discussion using our clinic review tool. No additional management support is needed unless otherwise documented below in the visit note.  

## 2015-02-13 NOTE — Progress Notes (Signed)
Patient ID: Steven Holmes, male   DOB: 03-Nov-1961, 54 y.o.   MRN: [id number]   Patient Active Problem List   Diagnosis Date Noted  . Lower extremity venous stasis 05/13/2012    Priority: High  . Hyperlipidemia associated with type 2 diabetes mellitus 02/15/2015  . Preoperative evaluation of a medical condition to rule out surgical contraindications (TAR required) 05/13/2014  . Candidiasis, intertriginous 05/13/2014  . Degenerative disk disease 02/26/2014  . Lumbar spinal stenosis 02/03/2014  . DM type 2 (diabetes mellitus, type 2) 05/22/2013  . Allergic rhinitis 12/25/2012  . Dyspnea 08/17/2012  . HTN (hypertension) 05/13/2012  . OSA (obstructive sleep apnea) 05/13/2012  . Obesity (BMI 30-39.9) 05/13/2012  . Diverticulosis     Subjective:  CC:   Chief Complaint  Patient presents with  . Follow-up  . Diabetes    HPI:   Steven Holmes is a 54 y.o. male who presents for   Follow up on multiple issues including type 2 DM controlled, hypertension, and obesit with untreated OSA due to intolerance of mask. Marland Kitchen   He continues to have low back pain and is unable to exercise. He is checking his  blood sugars less than  once daily at variable times.  BS have been under 130 fasting and < 150 post prandially.  Denies any recent hypoglyemic events.  Taking his medications as directed. Following a carbohydrate modified diet 6 days per week. Denies numbness, burning and tingling of extremities. Appetite is good.     BACK PAIN.  STOPPED SEEING THE PAIN CLINIC,  WASN'T HELPING .  Both SI joints and hips also bothering him .  Felt worse after last injection and after last  /facet blocks only provided pain relief    Past Medical History  Diagnosis Date  . Hypertension   . Diverticulosis     Past Surgical History  Procedure Laterality Date  . Hernia repair  2009    revision left sided inguinal at [facility]  . Vasectomy         The following portions of the patient's history  were reviewed and updated as appropriate: Allergies, current medications, and problem list.    Review of Systems:   Patient denies headache, fevers, malaise, unintentional weight loss, skin rash, eye pain, sinus congestion and sinus pain, sore throat, dysphagia,  hemoptysis , cough, dyspnea, wheezing, chest pain, palpitations, orthopnea, edema, abdominal pain, nausea, melena, diarrhea, constipation, flank pain, dysuria, hematuria, urinary  Frequency, nocturia, numbness, tingling, seizures,  Focal weakness, Loss of consciousness,  Tremor, insomnia, depression, anxiety, and suicidal ideation.     History   Social History  . Marital Status: Married    Spouse Name: N/A  . Number of Children: N/A  . Years of Education: N/A   Occupational History  . Not on file.   Social History Main Topics  . Smoking status: Former Smoker -- 1.00 packs/day for 25 years    Types: Cigarettes    Quit date: 11/03/2000  . Smokeless tobacco: Never Used  . Alcohol Use: No  . Drug Use: No  . Sexual Activity: Not on file   Other Topics Concern  . Not on file   Social History Narrative    Objective:  Filed Vitals:   02/13/15 1258  BP: 128/92  Pulse: 96  Temp: 99.1 F (37.3 C)  Resp: 16     General appearance: alert, cooperative and appears stated age Ears: normal TM's and external ear canals both ears Throat: lips, mucosa, and  tongue normal; teeth and gums normal Neck: no adenopathy, no carotid bruit, supple, symmetrical, trachea midline and thyroid not enlarged, symmetric, no tenderness/mass/nodules Back: symmetric, no curvature. ROM normal. No CVA tenderness. Lungs: clear to auscultation bilaterally Heart: regular rate and rhythm, S1, S2 normal, no murmur, click, rub or gallop Abdomen: soft, non-tender; bowel sounds normal; no masses,  no organomegaly Pulses: 2+ and symmetric Skin: Skin color, texture, turgor normal. No rashes or lesions Lymph nodes: Cervical, supraclavicular, and axillary  nodes normal.  Assessment and Plan:  OSA (obstructive sleep apnea) diagnosed with prior sleep study but treatment has been deferred d by patient due to mask intolerance .  Discussed the long term history of OSA , the risks of long term damage to heart and the signs and symptoms he now has that are  attributable to OSA.  Advised patient to consider  significant weight loss and/or use of CPAP. Patient has never treated his OSA due to intolerance of CPAP he is currently uninsured,     DM type 2 (diabetes mellitus, type 2) Currently well-controlled on current medications .  hemoglobin A1c is at goal of less than 7.0 . Patient is reminded to schedule an annual eye exam and foot exam is normal today. Patient has no microalbuminuria. Patient is tolerating statin therapy for CAD risk reduction and on ACE/ARB for renal protection and hypertension   Lab Results  Component Value Date   HGBA1C 5.7 02/13/2015   Lab Results  Component Value Date   MICROALBUR 0.5 09/07/2014      HTN (hypertension) Elevated diastolic reading today  With exam noteworthy for pitting edema. Reviewed list of meds, patient is not taking OTC meds that could be causing  It.  Adding furosemide,  Again advised patient to reconsider treating his sleep apnea.  He is currently uninsured. So is not able to consider treating his OSA.   Lab Results  Component Value Date   CREATININE 0.95 02/13/2015   Lab Results  Component Value Date   NA 136 02/13/2015   K 4.3 02/13/2015   CL 102 02/13/2015   CO2 30 02/13/2015      Lower extremity venous stasis Aggravated by pitting edema and nonuse of compression stockings.  lasix prescribed   Hyperlipidemia associated with type 2 diabetes mellitus Adding low dose atorvastatin for mitigatation of cardiac risk.   Lab Results  Component Value Date   CHOL 200 02/13/2015   HDL 37.20* 02/13/2015   LDLCALC 141* 09/07/2014   LDLDIRECT 142.0 02/13/2015   TRIG 206.0* 02/13/2015    CHOLHDL 5 02/13/2015      Obesity (BMI 30-39.9) I have addressed  BMI and recommended a low glycemic index diet utilizing smaller more frequent meals to increase metabolism.  I have also recommended that patient start exercising with a goal of 30 minutes of aerobic exercise a minimum of 5 days per week.  Goal wt is 209 lbs  Wt Readings from Last 3 Encounters:  02/13/15 259 lb 12 oz (117.822 kg)  08/15/14 245 lb 8 oz (111.358 kg)  05/10/14 248 lb (112.492 kg)     A total of 25 minutes was spent with patient more than half of which was spent in counseling patient on the above mentioned issues , reviewing and explaining recent labs and imaging studies done, and coordination of care.   Updated Medication List Outpatient Encounter Prescriptions as of 02/13/2015  Medication Sig  . ALFALFA PO Take 2 tablets by mouth 3 (three) times daily.  .Marland Kitchen  glipiZIDE (GLUCOTROL) 5 MG tablet TAKE ONE TABLET BY MOUTH TWICE DAILY BEFORE A MEAL  . losartan (COZAAR) 25 MG tablet TAKE ONE TABLET BY MOUTH ONCE DAILY  . metFORMIN (GLUCOPHAGE) 500 MG tablet TAKE ONE TABLET BY MOUTH TWICE DAILY WITH MEALS  . Multiple Vitamin (MULTIVITAMIN) tablet Take 1 tablet by mouth daily.  . traMADol (ULTRAM) 50 MG tablet Take 1 tablet (50 mg total) by mouth every 8 (eight) hours as needed.  . [DISCONTINUED] traMADol (ULTRAM) 50 MG tablet Take 1 tablet (50 mg total) by mouth every 8 (eight) hours as needed.  Marland Kitchen atorvastatin (LIPITOR) 20 MG tablet Take 1 tablet (20 mg total) by mouth daily.  . furosemide (LASIX) 20 MG tablet Take 1 tablet (20 mg total) by mouth daily. As needed for fluid retention  . methocarbamol (ROBAXIN-750) 750 MG tablet Take 1 tablet (750 mg total) by mouth 4 (four) times daily. (Patient not taking: Reported on 02/13/2015)  . nystatin cream (MYCOSTATIN) Apply 1 application topically 2 (two) times daily. Until rash has resolved (Patient not taking: Reported on 02/13/2015)  . oxyCODONE-acetaminophen  (PERCOCET/ROXICET) 5-325 MG per tablet Take 2 tablets by mouth every 4 (four) hours as needed for severe pain. (Patient not taking: Reported on 02/13/2015)     Orders Placed This Encounter  Procedures  . Comprehensive metabolic panel  . Hemoglobin A1c  . Lipid panel  . LDL cholesterol, direct    Return in about 6 months (around 08/15/2015) for follow up diabetes.

## 2015-02-13 NOTE — Assessment & Plan Note (Addendum)
Currently well-controlled on current medications .  hemoglobin A1c is at goal of less than 7.0 . Patient is reminded to schedule an annual eye exam and foot exam is normal today. Patient has no microalbuminuria. Patient is tolerating statin therapy for CAD risk reduction and on ACE/ARB for renal protection and hypertension   Lab Results  Component Value Date   HGBA1C 5.7 02/13/2015   Lab Results  Component Value Date   MICROALBUR 0.5 09/07/2014

## 2015-02-15 ENCOUNTER — Encounter: Payer: Self-pay | Admitting: Internal Medicine

## 2015-02-15 DIAGNOSIS — E785 Hyperlipidemia, unspecified: Secondary | ICD-10-CM

## 2015-02-15 DIAGNOSIS — E1169 Type 2 diabetes mellitus with other specified complication: Secondary | ICD-10-CM | POA: Insufficient documentation

## 2015-02-15 MED ORDER — ATORVASTATIN CALCIUM 20 MG PO TABS
20.0000 mg | ORAL_TABLET | Freq: Every day | ORAL | Status: DC
Start: 2015-02-15 — End: 2019-10-23

## 2015-02-15 NOTE — Assessment & Plan Note (Signed)
Adding low dose atorvastatin for mitigatation of cardiac risk.   Lab Results  Component Value Date   CHOL 200 02/13/2015   HDL 37.20* 02/13/2015   LDLCALC 141* 09/07/2014   LDLDIRECT 142.0 02/13/2015   TRIG 206.0* 02/13/2015   CHOLHDL 5 02/13/2015

## 2015-02-15 NOTE — Assessment & Plan Note (Signed)
I have addressed  BMI and recommended a low glycemic index diet utilizing smaller more frequent meals to increase metabolism.  I have also recommended that patient start exercising with a goal of 30 minutes of aerobic exercise a minimum of 5 days per week.  Goal wt is 209 lbs  Wt Readings from Last 3 Encounters:  02/13/15 259 lb 12 oz (117.822 kg)  08/15/14 245 lb 8 oz (111.358 kg)  05/10/14 248 lb (112.492 kg)

## 2015-02-15 NOTE — Assessment & Plan Note (Addendum)
Elevated diastolic reading today  With exam noteworthy for pitting edema. Reviewed list of meds, patient is not taking OTC meds that could be causing  It.  Adding furosemide,  Again advised patient to reconsider treating his sleep apnea.  He is currently uninsured. So is not able to consider treating his OSA.   Lab Results  Component Value Date   CREATININE 0.95 02/13/2015   Lab Results  Component Value Date   NA 136 02/13/2015   K 4.3 02/13/2015   CL 102 02/13/2015   CO2 30 02/13/2015

## 2015-02-15 NOTE — Assessment & Plan Note (Signed)
Aggravated by pitting edema and nonuse of compression stockings.  lasix prescribed

## 2015-02-25 NOTE — Discharge Summary (Signed)
PATIENT NAME:  Steven Holmes, [name]:  [id number] DATE OF BIRTH:  [birth year]  DATE OF ADMISSION:  02/07/2012 DATE OF DISCHARGE:  02/09/2012  PRESENTING COMPLAINT: Right lower extremity redness and swelling.   DISCHARGE DIAGNOSES:  1. Right lower extremity cellulitis.  2. Type 2 diabetes.  3. Hypertension.   CONDITION ON DISCHARGE: Fair. Vitals stable.   MEDICATIONS:  1. Augmentin 875 mg p.o. twice a day for seven days.  2. Metformin 500 mg p.o. daily.  3. Hydrochlorothiazide/lisinopril 12.5/10 mg p.o. daily.   FOLLOW UP: Follow up with Dr. Alison MurrayAndreassi in one week.   LABORATORY, DIAGNOSTIC AND RADIOLOGICAL DATA: Blood culture one out of two shows gram-positive cocci in cluster aerobic bottle only. White count 5.9, hemoglobin and hematocrit 15.7 and 45.6. Comprehensive metabolic panel within normal limits.   CONSULTATIONS: None.   BRIEF SUMMARY OF HOSPITAL COURSE: Mr. [name] is a 54 year old Caucasian gentleman with past medical history of hypertension and diabetes came to the Emergency Room with:  1. Right lower extremity cellulitis. Patient was placed on IV Zosyn. His culture were one out of gram-positive cocci which could be likely contamination. He remained afebrile. White count was normal. Patient's redness had improved remarkably. He was feeling much better with decrease in the swelling. He will be discharged home with p.o. Augmentin. I did inform patient if there is change in the antibiotics according to the blood cultures I will call it in and inform him of it. Patient will follow up with Dr. Alison MurrayAndreassi in one week.  2. Hypertension. Hydrochlorothiazide/lisinopril was continued.  3. Type 2 diabetes. Metformin was continued. 4. Hospital stay remained otherwise stable. CODE STATUS: Patient remained a FULL CODE.  TIME SPENT: 40 minutes.   ____________________________ Wylie HailSona A. Allena KatzPatel, MD sap:cms D: 02/09/2012 14:39:37 ET T: 02/10/2012 11:27:25 ET JOB#: [id number]  cc: Kanika Bungert A. Allena KatzPatel,  MD, <Dictator> Maureen P. Alison MurrayAndreassi, MD Willow OraSONA A Ryder Chesmore MD ELECTRONICALLY SIGNED 02/12/2012 14:29

## 2015-02-25 NOTE — H&P (Signed)
PATIENT NAME:  Steven Holmes, Steven Holmes MR#:  [id number] DATE OF BIRTH:  August 24, 1961  DATE OF ADMISSION:  02/07/2012  CHIEF COMPLAINT: Cellulitis.   HISTORY OF PRESENT ILLNESS: Mr. Schriver is a 54 year old white male with recently diagnosed diabetes mellitus type II, who presents with sudden onset of malaise, fatigue, dizziness, and nausea which occurred one day prior to admission while at work. He suddenly became so weak and dizzy feeling that he had to go home. He went home early from work, took a nap and when he woke up he noticed that his right ankle had a red band around it which he recognized from prior episodes of cellulitis as the beginning of cellulitis. He was too weak to drive himself to the ER yesterday and presented today with worsening redness of the lower extremity accompanied by persistent fatigue and dizziness. The patient states that he has not been hunting or working in the yard or in a garage. He has no history of insect bites. He states that he has an episode of cellulitis in this leg that occurs about once a year for the past five years. He has recently had an arterial and venous circulation evaluation in this leg by his primary care doctor and this was reportedly normal.   PRIMARY CARE PHYSICIAN:  Dr. Veneda Melter, [facility].   PAST MEDICAL HISTORY:  1. Diabetes mellitus type 2 diagnosed in 2013.  2. Hypertension.  3. 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   MEDICATIONS:  1. Metformin 500 mg b.i.d. with meals. 2. Prinzide, dose unknown.   PAST SURGICAL HISTORY:    1. History of a left inguinal hernia which was initially repaired in 1966 requiring a redo in 2001 at [facility].  2. History of vasectomy in 1996.   ALLERGIES: He has no known drug allergies.   LAST HOSPITALIZATION: Reportedly a year ago but there are no records in [place] to that effect.   FAMILY HISTORY: Negative for  early heart disease and cancer.   SOCIAL HISTORY: He is an ex-smoker, having quit in 2002 after a 30 pack-year history. He is an occasional drinker not more than 2 or 3 per week. 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   REVIEW OF SYSTEMS: Positive for fever, fatigue, and weakness of 24 hours. No blurred or double vision. No recent vision changes. Denies tinnitus, ear pain, hearing loss, seasonal rhinitis, epistaxis and difficulty swallowing. Denies cough, wheezing, hemoptysis, and dyspnea. He denies chest pain, orthopnea, and dyspnea on exertion. He does carry some chronic edema in the right lower extremity. He does report recent nausea without vomiting or diarrhea. No changes in bowel habits. No dysuria, frequency or incontinence. No polyuria or nocturia. No anemia, easy bruising, bleeding, or swollen glands. He does have chronic back pain secondary to scoliosis which is not new. He has no history of numbness, weakness, dysarthria, epilepsy, or strokes. He has no history of anxiety or insomnia.   PHYSICAL EXAMINATION:  GENERAL: This is an overweight, middle-aged male in no apparent distress.   VITAL SIGNS: Blood pressure 144/88, pulse 97. Initial temperature in the ED was 101, temperature now 98.3.   HEENT: Pupils are equal, round, and reactive to light. Extraocular movements are intact.   OROPHARYNX: Benign. Teeth are in good shape.   NECK: Supple without lymphadenopathy, JVD, thyromegaly, or carotid bruits.   LUNGS: Clear to auscultation bilaterally with no  wheezing or rhonchi.   CARDIOVASCULAR: Regular rate and rhythm. No murmurs, rubs, or gallops. He does have palpable pedal pulses. He does have lower extremity edema on the right, none on the left.   ABDOMEN: Soft, nontender, and nondistended with good bowel sounds. No evidence of hepatosplenomegaly. He has five out of five muscle strength in all extremities.   SKIN:  Notable for a warm red rash on his right lower extremity that starts above the ankle and goes to mid tibia. There is no rash on the foot. He does have one healing excoriation versus abrasion at the top of the red border which he states has been there for about a week and a half. There is no cervical, axillary, or supraclavicular lymphadenopathy. He does have inguinal lymphadenopathy on the right side.   NEUROLOGICAL: Grossly nonfocal. Deep tendon reflexes are intact. He is alert and oriented to person, place, and time and is comfortable and cooperative.   ADMISSION LABORATORY DATA: Sodium 139, potassium 4.5, chloride 103, bicarbonate 28, BUN 18, creatinine 1.01, glucose 110. White count 5.9, hemoglobin 15.7, platelets 132.   ASSESSMENT AND PLAN:  1. Cellulitis of lower extremity. Given his sudden onset of fevers and malaise, concern for cellulitis resulting in sepsis is raised. Blood cultures were drawn in the ED and he was started on IV vancomycin. Will add Zosyn. Cultures are pending. We will monitor skin for signs of advancement. He has no portal of entry unless the excoriation at the top of the red border is the culprit. However, there is no furuncle or abscess that can be drained and sent for samples at this time. Will treat empirically again with Zosyn and vancomycin for now.  2. Diabetes mellitus, recently diagnosed. Will continue metformin for now and have Accu-Cheks b.i.d.  3. Hypertension. He has been normotensive here in the ED, so will continue Prinivil at this point instead of Prinzide given his need for IV fluids during admission.   ESTIMATED TIME OF CARE: 40 minutes.    ____________________________ Duncan Dulleresa Juliona Vales, MD tt:ap D: 02/07/2012 21:45:06 ET T: 02/08/2012 10:09:10 ET JOB#: [id number]  cc: Duncan Dulleresa Dewitte Vannice, MD, <Dictator> Maureen P. Alison MurrayAndreassi, MD Duncan DullERESA Jakobi Thetford MD ELECTRONICALLY SIGNED 03/11/2012 18:46

## 2015-03-09 ENCOUNTER — Other Ambulatory Visit: Payer: Self-pay | Admitting: Internal Medicine

## 2015-08-15 ENCOUNTER — Ambulatory Visit: Payer: Self-pay | Admitting: Internal Medicine

## 2016-10-17 ENCOUNTER — Emergency Department: Payer: Self-pay

## 2016-10-17 ENCOUNTER — Emergency Department
Admission: EM | Admit: 2016-10-17 | Discharge: 2016-10-17 | Disposition: A | Discharge status: Home / Self Care | Payer: Self-pay | Attending: Emergency Medicine | Admitting: Emergency Medicine

## 2016-10-17 ENCOUNTER — Encounter: Payer: Self-pay | Admitting: Emergency Medicine

## 2016-10-17 DIAGNOSIS — J069 Acute upper respiratory infection, unspecified: Secondary | ICD-10-CM | POA: Insufficient documentation

## 2016-10-17 DIAGNOSIS — Z87891 Personal history of nicotine dependence: Secondary | ICD-10-CM | POA: Insufficient documentation

## 2016-10-17 DIAGNOSIS — R05 Cough: Secondary | ICD-10-CM

## 2016-10-17 DIAGNOSIS — I10 Essential (primary) hypertension: Secondary | ICD-10-CM | POA: Insufficient documentation

## 2016-10-17 DIAGNOSIS — R059 Cough, unspecified: Secondary | ICD-10-CM

## 2016-10-17 DIAGNOSIS — Z79899 Other long term (current) drug therapy: Secondary | ICD-10-CM | POA: Insufficient documentation

## 2016-10-17 DIAGNOSIS — E119 Type 2 diabetes mellitus without complications: Secondary | ICD-10-CM | POA: Insufficient documentation

## 2016-10-17 DIAGNOSIS — Z7984 Long term (current) use of oral hypoglycemic drugs: Secondary | ICD-10-CM | POA: Insufficient documentation

## 2016-10-17 MED ORDER — FLUTICASONE PROPIONATE 50 MCG/ACT NA SUSP: 2.0000 | Freq: Every day | NASAL | 0 refills | Status: DC

## 2016-10-17 MED ORDER — ALBUTEROL SULFATE HFA 108 (90 BASE) MCG/ACT IN AERS: 2.0000 | INHALATION_SPRAY | Freq: Four times a day (QID) | RESPIRATORY_TRACT | 0 refills | Status: DC | PRN

## 2016-10-17 MED ORDER — BENZONATATE 100 MG PO CAPS: 100.0000 mg | ORAL_CAPSULE | Freq: Three times a day (TID) | ORAL | 0 refills | Status: DC | PRN

## 2016-10-17 MED ORDER — IPRATROPIUM-ALBUTEROL 0.5-2.5 (3) MG/3ML IN SOLN
3.0000 mL | Freq: Once | RESPIRATORY_TRACT | Status: AC
  Administered 2016-10-17: 3 mL via RESPIRATORY_TRACT
  Filled 2016-10-17: qty 3

## 2016-10-17 NOTE — ED Triage Notes (Signed)
Pt comes into the ED via POV c/o cough and upper respiratory symptoms.  Patient explains he has a productive cough that is producing green mucous.  Patient denies any chest pain or fevers.  Denies COPD or asthma.  Patient presents with even and unlabored respirations.

## 2016-10-17 NOTE — Discharge Instructions (Signed)
Take the prescription meds as directed. Consider using a dextromethorphan (Delsym) cough syrup as for cough relief. [facility] in [place] for continued symptoms. Return to the ED as needed.

## 2016-10-17 NOTE — ED Provider Notes (Signed)
[facility] Emergency Department Provider Note ____________________________________________  Time seen: 1635  I have reviewed the triage vital signs and the nursing notes.  HISTORY  Chief Complaint  URI  HPI Steven Holmes is a 55 y.o. male presents to the ED for evaluation of a 1 week complaint of cough and congestion. He reports an intermittently productive cough without hemoptysis. He denies fevers, chills, chest pains, or sweats. He has taken Robitussin, sporadically, for symptom relief.   Past Medical History:  Diagnosis Date  . Diverticulosis   . Hypertension     Patient Active Problem List   Diagnosis Date Noted  . Hyperlipidemia associated with type 2 diabetes mellitus (HCC) 02/15/2015  . Preoperative evaluation of a medical condition to rule out surgical contraindications (TAR required) 05/13/2014  . Candidiasis, intertriginous 05/13/2014  . Degenerative disk disease 02/26/2014  . Lumbar spinal stenosis 02/03/2014  . DM type 2 (diabetes mellitus, type 2) (HCC) 05/22/2013  . Allergic rhinitis 12/25/2012  . Dyspnea 08/17/2012  . HTN (hypertension) 05/13/2012  . OSA (obstructive sleep apnea) 05/13/2012  . Obesity (BMI 30-39.9) 05/13/2012  . Lower extremity venous stasis 05/13/2012  . Diverticulosis     Past Surgical History:  Procedure Laterality Date  . HERNIA REPAIR  2009   [facility]  . VASECTOMY      Prior to Admission medications   Medication Sig Start Date End Date Taking? Authorizing Provider  albuterol (PROVENTIL HFA;VENTOLIN HFA) 108 (90 Base) MCG/ACT inhaler Inhale 2 puffs into the lungs every 6 (six) hours as needed for wheezing or shortness of breath. 10/17/16   Basya Casavant V Bacon Haileyann Staiger, PA-C  ALFALFA PO Take 2 tablets by mouth 3 (three) times daily.    Historical Provider, MD  atorvastatin (LIPITOR) 20 MG tablet Take 1 tablet (20 mg total) by mouth daily. 02/15/15   Sherlene Shamseresa L Tullo, MD  benzonatate  (TESSALON PERLES) 100 MG capsule Take 1 capsule (100 mg total) by mouth 3 (three) times daily as needed for cough (Take 1-2 per dose). 10/17/16   Chantz Montefusco V Bacon Takoya Jonas, PA-C  fluticasone (FLONASE) 50 MCG/ACT nasal spray Place 2 sprays into both nostrils daily. 10/17/16   Marijke Guadiana V Bacon Armin Yerger, PA-C  furosemide (LASIX) 20 MG tablet Take 1 tablet (20 mg total) by mouth daily. As needed for fluid retention 02/13/15   Sherlene Shamseresa L Tullo, MD  glipiZIDE (GLUCOTROL) 5 MG tablet TAKE ONE TABLET BY MOUTH TWICE DAILY BEFORE A MEAL 09/13/14   Sherlene Shamseresa L Tullo, MD  losartan (COZAAR) 25 MG tablet TAKE ONE TABLET BY MOUTH ONCE DAILY 03/09/15   Sherlene Shamseresa L Tullo, MD  metFORMIN (GLUCOPHAGE) 500 MG tablet TAKE ONE TABLET BY MOUTH TWICE DAILY WITH MEALS 10/09/14   Sherlene Shamseresa L Tullo, MD  methocarbamol (ROBAXIN-750) 750 MG tablet Take 1 tablet (750 mg total) by mouth 4 (four) times daily. Patient not taking: Reported on 02/13/2015 09/06/13   Lorre NickAnthony Allen, MD  Multiple Vitamin (MULTIVITAMIN) tablet Take 1 tablet by mouth daily.    Historical Provider, MD  nystatin cream (MYCOSTATIN) Apply 1 application topically 2 (two) times daily. Until rash has resolved Patient not taking: Reported on 02/13/2015 05/10/14   Sherlene Shamseresa L Tullo, MD  oxyCODONE-acetaminophen (PERCOCET/ROXICET) 5-325 MG per tablet Take 2 tablets by mouth every 4 (four) hours as needed for severe pain. Patient not taking: Reported on 02/13/2015 09/06/13   Lorre NickAnthony Allen, MD  traMADol (ULTRAM) 50 MG tablet Take 1 tablet (50 mg total) by mouth every 8 (  eight) hours as needed. 02/13/15   Sherlene Shamseresa L Tullo, MD   Allergies Lisinopril  Family History  Problem Relation Age of Onset  . Cancer Mother     skin Ca  . Cancer Father     basal cell , nose     Social History Social History  Substance Use Topics  . Smoking status: Former Smoker    Packs/day: 1.00    Years: 25.00    Types: Cigarettes    Quit date: 11/03/2000  . Smokeless tobacco: Never Used  . Alcohol use No     Review of Systems  Constitutional: Negative for fever. Eyes: Negative for visual changes. ENT: Negative for sore throat. Cardiovascular: Negative for chest pain. Respiratory: Positive for shortness of breath. Reports productive cough Gastrointestinal: Negative for abdominal pain, vomiting and diarrhea. Musculoskeletal: Negative for back pain. Neurological: Negative for headaches, focal weakness or numbness. ____________________________________________  PHYSICAL EXAM:  VITAL SIGNS: ED Triage Vitals  Enc Vitals Group     BP 10/17/16 1621 (!) 160/96     Pulse Rate 10/17/16 1621 79     Resp 10/17/16 1621 18     Temp 10/17/16 1621 97.8 F (36.6 C)     Temp Source 10/17/16 1621 Oral     SpO2 10/17/16 1621 95 %     Weight 10/17/16 1622 270 lb (122.5 kg)     Height 10/17/16 1622 5\' 8"  (1.727 m)     Head Circumference --      Peak Flow --      Pain Score 10/17/16 1622 3     Pain Loc --      Pain Edu? --      Excl. in GC? --     Constitutional: Alert and oriented. Well appearing and in no distress. Head: Normocephalic and atraumatic. Eyes: Conjunctivae are normal. PERRL. Normal extraocular movements Ears: Canals clear. TMs intact bilaterally. Nose: No congestion/rhinorrhea/epistaxis. Mouth/Throat: Mucous membranes are moist. Cardiovascular: Normal rate, regular rhythm. Normal distal pulses. Respiratory: Normal respiratory effort. No wheezes/rales. Mild rhonchi bilaterally.  Gastrointestinal: Soft and nontender. No distention. Musculoskeletal: Nontender with normal range of motion in all extremities.  Neurologic:  Normal gait without ataxia. Normal speech and language. No gross focal neurologic deficits are appreciated. Skin:  Skin is warm, dry and intact. No rash noted. ____________________________________________   RADIOLOGY  CXR IMPRESSION: No active cardiopulmonary disease. ____________________________________________  PROCEDURES  DuoNeb x  1 ____________________________________________  INITIAL IMPRESSION / ASSESSMENT AND PLAN / ED COURSE  Patient with symptoms consistent with a viral URI with bronchospasm. He is reassured by his negative CXR findings, and reports improvement following the DuoNeb treatment. He will be discharged with prescriptions for albuterol MDI, Tessalon Perles, and Flonase. He should dose OTC cough syrup as needed. He will [facility] or return as needed.    Clinical Course    ____________________________________________  FINAL CLINICAL IMPRESSION(S) / ED DIAGNOSES  Final diagnoses:  Viral upper respiratory tract infection  Cough      Steven HoardJenise V Bacon Vimal Derego, PA-C 10/17/16 1933    Minna AntisKevin Paduchowski, MD 10/20/16 [phone]

## 2017-01-22 ENCOUNTER — Emergency Department
Admission: EM | Admit: 2017-01-22 | Discharge: 2017-01-23 | Disposition: A | Discharge status: Home / Self Care | Payer: Self-pay | Attending: Student in an Organized Health Care Education/Training Program | Admitting: Student in an Organized Health Care Education/Training Program

## 2017-01-22 ENCOUNTER — Encounter: Payer: Self-pay | Admitting: Physician Assistant

## 2017-01-22 DIAGNOSIS — I10 Essential (primary) hypertension: Secondary | ICD-10-CM | POA: Insufficient documentation

## 2017-01-22 DIAGNOSIS — Z87891 Personal history of nicotine dependence: Secondary | ICD-10-CM | POA: Insufficient documentation

## 2017-01-22 DIAGNOSIS — M5136 Other intervertebral disc degeneration, lumbar region: Secondary | ICD-10-CM | POA: Insufficient documentation

## 2017-01-22 DIAGNOSIS — M549 Dorsalgia, unspecified: Secondary | ICD-10-CM

## 2017-01-22 DIAGNOSIS — Z79899 Other long term (current) drug therapy: Secondary | ICD-10-CM | POA: Insufficient documentation

## 2017-01-22 DIAGNOSIS — G8929 Other chronic pain: Secondary | ICD-10-CM

## 2017-01-22 DIAGNOSIS — Z7984 Long term (current) use of oral hypoglycemic drugs: Secondary | ICD-10-CM | POA: Insufficient documentation

## 2017-01-22 DIAGNOSIS — E119 Type 2 diabetes mellitus without complications: Secondary | ICD-10-CM | POA: Insufficient documentation

## 2017-01-22 MED ORDER — CYCLOBENZAPRINE HCL 5 MG PO TABS: 5.0000 mg | ORAL_TABLET | Freq: Three times a day (TID) | ORAL | 0 refills | Status: DC | PRN

## 2017-01-22 MED ORDER — KETOROLAC TROMETHAMINE 60 MG/2ML IM SOLN
60.0000 mg | Freq: Once | INTRAMUSCULAR | Status: AC
  Administered 2017-01-22: 60 mg via INTRAMUSCULAR
  Filled 2017-01-22: qty 2

## 2017-01-22 MED ORDER — ORPHENADRINE CITRATE 30 MG/ML IJ SOLN
60.0000 mg | INTRAMUSCULAR | Status: AC
  Administered 2017-01-22: 60 mg via INTRAMUSCULAR
  Filled 2017-01-22: qty 2

## 2017-01-22 MED ORDER — KETOROLAC TROMETHAMINE 10 MG PO TABS: 10.0000 mg | ORAL_TABLET | Freq: Three times a day (TID) | ORAL | 0 refills | Status: DC

## 2017-01-22 NOTE — ED Triage Notes (Signed)
Pt in with co lower back pain, states unable to stand straight due to pain. Hx of chronic stenosis of the lumbar spine, worse when he walks or moves. Denies any recent injury.

## 2017-01-22 NOTE — ED Provider Notes (Signed)
[facility] Emergency Department Provider Note ____________________________________________  Time seen: 2044  I have reviewed the triage vital signs and the nursing notes.  HISTORY  Chief Complaint  Back Pain  HPI Steven Holmes is a 56 y.o. male who presents to the ED for evaluation of worsening lower back pain above baseline. He denies any injury, accident, trauma, or fall. He normally takes naproxen for relief ofback pain. He denies incontinence, leg weakness, or foot drop. He reports stiffness and increased pain with attempts to fully extend at the waist. He endorses a history of severe DDD and HNP.   Past Medical History:  Diagnosis Date  . Diverticulosis   . Hypertension     Patient Active Problem List   Diagnosis Date Noted  . Hyperlipidemia associated with type 2 diabetes mellitus (HCC) 02/15/2015  . Preoperative evaluation of a medical condition to rule out surgical contraindications (TAR required) 05/13/2014  . Candidiasis, intertriginous 05/13/2014  . Degenerative disk disease 02/26/2014  . Lumbar spinal stenosis 02/03/2014  . DM type 2 (diabetes mellitus, type 2) (HCC) 05/22/2013  . Allergic rhinitis 12/25/2012  . Dyspnea 08/17/2012  . HTN (hypertension) 05/13/2012  . OSA (obstructive sleep apnea) 05/13/2012  . Obesity (BMI 30-39.9) 05/13/2012  . Lower extremity venous stasis 05/13/2012  . Diverticulosis     Past Surgical History:  Procedure Laterality Date  . HERNIA REPAIR  2009   revision left sided inguinal at [facility]  . VASECTOMY      Prior to Admission medications   Medication Sig Start Date End Date Taking? Authorizing Provider  albuterol (PROVENTIL HFA;VENTOLIN HFA) 108 (90 Base) MCG/ACT inhaler Inhale 2 puffs into the lungs every 6 (six) hours as needed for wheezing or shortness of breath. 10/17/16   Airiel Oblinger V Bacon Lavine Hargrove, PA-C  ALFALFA PO Take 2 tablets by mouth 3 (three) times daily.    Historical Provider, MD   atorvastatin (LIPITOR) 20 MG tablet Take 1 tablet (20 mg total) by mouth daily. 02/15/15   Sherlene Shams, MD  benzonatate (TESSALON PERLES) 100 MG capsule Take 1 capsule (100 mg total) by mouth 3 (three) times daily as needed for cough (Take 1-2 per dose). 10/17/16   Tatianna Ibbotson V Bacon Jaliza Seifried, PA-C  cyclobenzaprine (FLEXERIL) 5 MG tablet Take 1 tablet (5 mg total) by mouth 3 (three) times daily as needed for muscle spasms. 01/22/17   Natasha Paulson V Bacon Sallyanne Birkhead, PA-C  fluticasone (FLONASE) 50 MCG/ACT nasal spray Place 2 sprays into both nostrils daily. 10/17/16   Carlo Lorson V Bacon Forestine Macho, PA-C  furosemide (LASIX) 20 MG tablet Take 1 tablet (20 mg total) by mouth daily. As needed for fluid retention 02/13/15   Sherlene Shams, MD  glipiZIDE (GLUCOTROL) 5 MG tablet TAKE ONE TABLET BY MOUTH TWICE DAILY BEFORE A MEAL 09/13/14   Sherlene Shams, MD  ketorolac (TORADOL) 10 MG tablet Take 1 tablet (10 mg total) by mouth every 8 (eight) hours. 01/22/17   Heberto Sturdevant V Bacon Lovetta Condie, PA-C  losartan (COZAAR) 25 MG tablet TAKE ONE TABLET BY MOUTH ONCE DAILY 03/09/15   Sherlene Shams, MD  metFORMIN (GLUCOPHAGE) 500 MG tablet TAKE ONE TABLET BY MOUTH TWICE DAILY WITH MEALS 10/09/14   Sherlene Shams, MD  methocarbamol (ROBAXIN-750) 750 MG tablet Take 1 tablet (750 mg total) by mouth 4 (four) times daily. Patient not taking: Reported on 02/13/2015 09/06/13   Lorre Nick, MD  Multiple Vitamin (MULTIVITAMIN) tablet Take 1 tablet by mouth daily.    Historical  Provider, MD  nystatin cream (MYCOSTATIN) Apply 1 application topically 2 (two) times daily. Until rash has resolved Patient not taking: Reported on 02/13/2015 05/10/14   Sherlene Shams, MD  oxyCODONE-acetaminophen (PERCOCET/ROXICET) 5-325 MG per tablet Take 2 tablets by mouth every 4 (four) hours as needed for severe pain. Patient not taking: Reported on 02/13/2015 09/06/13   Lorre Nick, MD  traMADol (ULTRAM) 50 MG tablet Take 1 tablet (50 mg total) by mouth every 8 (eight) hours as  needed. 02/13/15   Sherlene Shams, MD    Allergies Lisinopril  Family History  Problem Relation Age of Onset  . Cancer Mother     skin Ca  . Cancer Father     basal cell , nose     Social History Social History  Substance Use Topics  . Smoking status: Former Smoker    Packs/day: 1.00    Years: 25.00    Types: Cigarettes    Quit date: 11/03/2000  . Smokeless tobacco: Never Used  . Alcohol use No    Review of Systems  Constitutional: Negative for fever. Cardiovascular: Negative for chest pain. Respiratory: Negative for shortness of breath. Gastrointestinal: Negative for abdominal pain, vomiting and diarrhea. Genitourinary: Negative for dysuria. Musculoskeletal: Positive for back pain. Skin: Negative for rash. Neurological: Negative for headaches, focal weakness or numbness. ____________________________________________  PHYSICAL EXAM:  VITAL SIGNS: ED Triage Vitals  Enc Vitals Group     BP 01/22/17 1944 (!) 154/95     Pulse Rate 01/22/17 1944 98     Resp 01/22/17 1944 18     Temp 01/22/17 1944 99 F (37.2 C)     Temp Source 01/22/17 1944 Oral     SpO2 01/22/17 1944 98 %     Weight 01/22/17 1944 270 lb (122.5 kg)     Height 01/22/17 1944 5\' 8"  (1.727 m)     Head Circumference --      Peak Flow --      Pain Score 01/22/17 1945 8     Pain Loc --      Pain Edu? --      Excl. in GC? --     Constitutional: Alert and oriented. Well appearing and in no distress. Head: Normocephalic and atraumatic. Cardiovascular: Normal rate, regular rhythm. Normal distal pulses. Respiratory: Normal respiratory effort. No wheezes/rales/rhonchi. Gastrointestinal: Soft and nontender. No distention. Musculoskeletal: Normal spinal alignment without midline tenderness, spasm, deformity, or step-off. Tenderness to palp over the bilateral sacrum. Normal sit to stand transition. Normal lumbar flexion/extension ROM. Normal toe/heel raise. Nontender with normal range of motion in all  extremities.  Neurologic:  Normal gait without ataxia. Normal LE DTRs bilaterally. Normal speech and language. No gross focal neurologic deficits are appreciated. Skin:  Skin is warm, dry and intact. No rash noted. Psychiatric: Mood and affect are normal. Patient exhibits appropriate insight and judgment. ____________________________________________  PROCEDURES  Toradol 60 mg IM Norflex 60 mg IM ____________________________________________  INITIAL IMPRESSION / ASSESSMENT AND PLAN / ED COURSE  Patient with reported improvement of LBP following IM med administration. His exam is reassuring, without any acute neuromuscular deficit. He is discharged with prescriptions for Toradol and Flexeril. He will follow-up with his provider for ongoing pain management. Return to the ED for acutely worsening symptoms.  ____________________________________________  FINAL CLINICAL IMPRESSION(S) / ED DIAGNOSES  Final diagnoses:  Exacerbation of chronic back pain  DDD (degenerative disc disease), lumbar      Charlesetta Ivory Hatsumi Steinhart, PA-C 01/23/17 0000  Willy EddyPatrick Robinson, MD 01/23/17 0005

## 2017-01-22 NOTE — Discharge Instructions (Addendum)
Your exam shows a flare of your chronic back pain. You should follow-up with your provider for continued symptoms. Take the prescription meds as directed. Return to the ED as needed.

## 2017-03-03 ENCOUNTER — Emergency Department (HOSPITAL_COMMUNITY)
Admission: EM | Admit: 2017-03-03 | Discharge: 2017-03-03 | Disposition: A | Discharge status: Home / Self Care | Payer: Non-veteran care | Attending: Physician Assistant | Admitting: Physician Assistant

## 2017-03-03 ENCOUNTER — Encounter (HOSPITAL_COMMUNITY): Payer: Self-pay | Admitting: Emergency Medicine

## 2017-03-03 ENCOUNTER — Emergency Department (HOSPITAL_COMMUNITY): Payer: Non-veteran care

## 2017-03-03 DIAGNOSIS — Z87891 Personal history of nicotine dependence: Secondary | ICD-10-CM | POA: Insufficient documentation

## 2017-03-03 DIAGNOSIS — E119 Type 2 diabetes mellitus without complications: Secondary | ICD-10-CM | POA: Insufficient documentation

## 2017-03-03 DIAGNOSIS — I10 Essential (primary) hypertension: Secondary | ICD-10-CM | POA: Diagnosis not present

## 2017-03-03 DIAGNOSIS — Z79899 Other long term (current) drug therapy: Secondary | ICD-10-CM | POA: Diagnosis not present

## 2017-03-03 DIAGNOSIS — S4991XA Unspecified injury of right shoulder and upper arm, initial encounter: Secondary | ICD-10-CM

## 2017-03-03 DIAGNOSIS — Z7984 Long term (current) use of oral hypoglycemic drugs: Secondary | ICD-10-CM | POA: Diagnosis not present

## 2017-03-03 DIAGNOSIS — Y929 Unspecified place or not applicable: Secondary | ICD-10-CM | POA: Insufficient documentation

## 2017-03-03 DIAGNOSIS — Y939 Activity, unspecified: Secondary | ICD-10-CM | POA: Insufficient documentation

## 2017-03-03 DIAGNOSIS — W1830XA Fall on same level, unspecified, initial encounter: Secondary | ICD-10-CM | POA: Diagnosis not present

## 2017-03-03 DIAGNOSIS — S199XXA Unspecified injury of neck, initial encounter: Secondary | ICD-10-CM | POA: Diagnosis present

## 2017-03-03 DIAGNOSIS — W19XXXA Unspecified fall, initial encounter: Secondary | ICD-10-CM

## 2017-03-03 DIAGNOSIS — Y999 Unspecified external cause status: Secondary | ICD-10-CM | POA: Insufficient documentation

## 2017-03-03 DIAGNOSIS — S161XXA Strain of muscle, fascia and tendon at neck level, initial encounter: Secondary | ICD-10-CM | POA: Diagnosis not present

## 2017-03-03 NOTE — ED Provider Notes (Signed)
[facility]-EMERGENCY DEPT Provider Note   CSN: [id number] Arrival date & time: 03/03/17  1904     History   Chief Complaint Chief Complaint  Patient presents with  . Neck Injury    HPI Steven Holmes is a 56 y.o. male.  HPI   H and fell on concrete steps last Thursday. Patient said he had pain as a left heel so he didn't notice the pain in his neck and shoulder until today. Patient reports mild pain with moving his right shoulder and mild pain in his trapezius.  No numbness to tingling or weakness.  Past Medical History:  Diagnosis Date  . Diverticulosis   . Hypertension     Patient Active Problem List   Diagnosis Date Noted  . Hyperlipidemia associated with type 2 diabetes mellitus (HCC) 02/15/2015  . Preoperative evaluation of a medical condition to rule out surgical contraindications (TAR required) 05/13/2014  . Candidiasis, intertriginous 05/13/2014  . Degenerative disk disease 02/26/2014  . Lumbar spinal stenosis 02/03/2014  . DM type 2 (diabetes mellitus, type 2) (HCC) 05/22/2013  . Allergic rhinitis 12/25/2012  . Dyspnea 08/17/2012  . HTN (hypertension) 05/13/2012  . OSA (obstructive sleep apnea) 05/13/2012  . Obesity (BMI 30-39.9) 05/13/2012  . Lower extremity venous stasis 05/13/2012  . Diverticulosis     Past Surgical History:  Procedure Laterality Date  . HERNIA REPAIR  2009   revision left sided inguinal at [facility]  . VASECTOMY         Home Medications    Prior to Admission medications   Medication Sig Start Date End Date Taking? Authorizing Provider  albuterol (PROVENTIL HFA;VENTOLIN HFA) 108 (90 Base) MCG/ACT inhaler Inhale 2 puffs into the lungs every 6 (six) hours as needed for wheezing or shortness of breath. 10/17/16   Jenise V Bacon Menshew, PA-C  ALFALFA PO Take 2 tablets by mouth 3 (three) times daily.    Historical Provider, MD  atorvastatin (LIPITOR) 20 MG tablet Take 1 tablet (20 mg total) by mouth daily. 02/15/15   Sherlene Shams, MD    benzonatate (TESSALON PERLES) 100 MG capsule Take 1 capsule (100 mg total) by mouth 3 (three) times daily as needed for cough (Take 1-2 per dose). 10/17/16   Jenise V Bacon Menshew, PA-C  cyclobenzaprine (FLEXERIL) 5 MG tablet Take 1 tablet (5 mg total) by mouth 3 (three) times daily as needed for muscle spasms. 01/22/17   Jenise V Bacon Menshew, PA-C  fluticasone (FLONASE) 50 MCG/ACT nasal spray Place 2 sprays into both nostrils daily. 10/17/16   Jenise V Bacon Menshew, PA-C  furosemide (LASIX) 20 MG tablet Take 1 tablet (20 mg total) by mouth daily. As needed for fluid retention 02/13/15   Sherlene Shams, MD  glipiZIDE (GLUCOTROL) 5 MG tablet TAKE ONE TABLET BY MOUTH TWICE DAILY BEFORE A MEAL 09/13/14   Sherlene Shams, MD  ketorolac (TORADOL) 10 MG tablet Take 1 tablet (10 mg total) by mouth every 8 (eight) hours. 01/22/17   Jenise V Bacon Menshew, PA-C  losartan (COZAAR) 25 MG tablet TAKE ONE TABLET BY MOUTH ONCE DAILY 03/09/15   Sherlene Shams, MD  metFORMIN (GLUCOPHAGE) 500 MG tablet TAKE ONE TABLET BY MOUTH TWICE DAILY WITH MEALS 10/09/14   Sherlene Shams, MD  methocarbamol (ROBAXIN-750) 750 MG tablet Take 1 tablet (750 mg total) by mouth 4 (four) times daily. Patient not taking: Reported on 02/13/2015 09/06/13   Lorre Nick, MD  Multiple Vitamin (MULTIVITAMIN) tablet Take 1 tablet by  mouth daily.    Historical Provider, MD  nystatin cream (MYCOSTATIN) Apply 1 application topically 2 (two) times daily. Until rash has resolved Patient not taking: Reported on 02/13/2015 05/10/14   Sherlene Shams, MD  oxyCODONE-acetaminophen (PERCOCET/ROXICET) 5-325 MG per tablet Take 2 tablets by mouth every 4 (four) hours as needed for severe pain. Patient not taking: Reported on 02/13/2015 09/06/13   Lorre Nick, MD  traMADol (ULTRAM) 50 MG tablet Take 1 tablet (50 mg total) by mouth every 8 (eight) hours as needed. 02/13/15   Sherlene Shams, MD    Family History Family History  Problem Relation Age of Onset  .  Cancer Mother     skin Ca  . Cancer Father     basal cell , nose     Social History Social History  Substance Use Topics  . Smoking status: Former Smoker    Packs/day: 1.00    Years: 25.00    Types: Cigarettes    Quit date: 11/03/2000  . Smokeless tobacco: Never Used  . Alcohol use No     Allergies   Lisinopril   Review of Systems Review of Systems  Constitutional: Negative for activity change.  Respiratory: Negative for shortness of breath.   Cardiovascular: Negative for chest pain.  Gastrointestinal: Negative for abdominal pain.  Neurological: Negative for tremors, weakness and numbness.  All other systems reviewed and are negative.    Physical Exam Updated Vital Signs BP (!) 159/115 (BP Location: Right Arm)   Pulse 96   Resp 17   Ht  (1.727 m)   Wt 265 lb (120.2 kg)   SpO2 99%   BMI 40.29 kg/m   Physical Exam  Constitutional: He is oriented to person, place, and time. He appears well-nourished.  HENT:  Head: Normocephalic.  Eyes: Conjunctivae are normal.  Cardiovascular: Normal rate.   Pulmonary/Chest: Effort normal.  Musculoskeletal:  Good strength bilaterally. Good sensation bilaterally. Patient has full range of motion of right shoulder. Patient can move the neck to the right and left and extend and flex. Without pain.  Neurological: He is oriented to person, place, and time.  Skin: Skin is warm and dry. He is not diaphoretic.  Psychiatric: He has a normal mood and affect. His behavior is normal.     ED Treatments / Results  Labs (all labs ordered are listed, but only abnormal results are displayed) Labs Reviewed - No data to display  EKG  EKG Interpretation None       Radiology No results found.  Procedures Procedures (including critical care time)  Medications Ordered in ED Medications - No data to display   Initial Impression / Assessment and Plan / ED Course  I have reviewed the triage vital signs and the nursing  notes.  Pertinent labs & imaging results that were available during my care of the patient were reviewed by me and considered in my medical decision making (see chart for details).     \well-appearing gentleman presenting with mild pain to the right shoulder and right trapezius/neck since last Thursday after fall. Patient fell striking his but. Did not strike his head no LOC. Did not hit either his shoulder or his spine. We will get x-rays, plain films. We'll defer any more sophisticated imaging to outpatient provider if not improving.  Final Clinical Impressions(s) / ED Diagnoses   Final diagnoses:  None    New Prescriptions New Prescriptions   No medications on file     Myers Tutterow Lyn  Corlis Leak, MD 03/03/17 2016

## 2017-03-03 NOTE — ED Triage Notes (Addendum)
Patient he reports he fell on concrete steps last Thursday. Patient said he slipped on some water. No LOC. Initially felt like he bruised L achilles but that pain went away next day. Next day, Friday, patient reports the pain in his R shoulder started and hasn't let up. Pain moves to back of neck.

## 2017-03-03 NOTE — Discharge Instructions (Signed)
Please take then naproxsyn and Flexeril you already have. We don't believe that there are any injuries that require surgical intervention at this time. Please monitor symptoms and return to follow up with her primary care physician as needed. Please return with any numbness tingling weakness or other concerns.

## 2017-03-03 NOTE — ED Notes (Signed)
Pt had a fall onto concrete on Thursday.  Felt he had just pulled a muscle and it "would work out" but continues to have pain and limited movement only due to pain.

## 2017-03-22 ENCOUNTER — Encounter: Payer: Self-pay | Admitting: Emergency Medicine

## 2017-03-22 ENCOUNTER — Emergency Department
Admission: EM | Admit: 2017-03-22 | Discharge: 2017-03-22 | Disposition: A | Discharge status: Home / Self Care | Payer: Non-veteran care | Attending: Student in an Organized Health Care Education/Training Program | Admitting: Student in an Organized Health Care Education/Training Program

## 2017-03-22 ENCOUNTER — Emergency Department: Payer: Non-veteran care

## 2017-03-22 DIAGNOSIS — Z79899 Other long term (current) drug therapy: Secondary | ICD-10-CM | POA: Diagnosis not present

## 2017-03-22 DIAGNOSIS — L03115 Cellulitis of right lower limb: Secondary | ICD-10-CM | POA: Insufficient documentation

## 2017-03-22 DIAGNOSIS — Z7984 Long term (current) use of oral hypoglycemic drugs: Secondary | ICD-10-CM | POA: Diagnosis not present

## 2017-03-22 DIAGNOSIS — Z87891 Personal history of nicotine dependence: Secondary | ICD-10-CM | POA: Insufficient documentation

## 2017-03-22 DIAGNOSIS — R2241 Localized swelling, mass and lump, right lower limb: Secondary | ICD-10-CM | POA: Diagnosis present

## 2017-03-22 DIAGNOSIS — M7989 Other specified soft tissue disorders: Secondary | ICD-10-CM

## 2017-03-22 DIAGNOSIS — I10 Essential (primary) hypertension: Secondary | ICD-10-CM | POA: Insufficient documentation

## 2017-03-22 DIAGNOSIS — E119 Type 2 diabetes mellitus without complications: Secondary | ICD-10-CM | POA: Diagnosis not present

## 2017-03-22 HISTORY — DX: Reserved for concepts with insufficient information to code with codable children: IMO0002

## 2017-03-22 HISTORY — DX: Type 2 diabetes mellitus without complications: E11.9

## 2017-03-22 HISTORY — DX: Spondylosis without myelopathy or radiculopathy, lumbar region: M47.816

## 2017-03-22 LAB — COMPREHENSIVE METABOLIC PANEL
ALBUMIN: 3.8 g/dL (ref 3.5–5.0)
ALT: 36 U/L (ref 17–63)
ANION GAP: 9 (ref 5–15)
AST: 35 U/L (ref 15–41)
Alkaline Phosphatase: 62 U/L (ref 38–126)
BUN: 18 mg/dL (ref 6–20)
CO2: 24 mmol/L (ref 22–32)
Calcium: 8.9 mg/dL (ref 8.9–10.3)
Chloride: 99 mmol/L — ABNORMAL LOW (ref 101–111)
Creatinine, Ser: 1.08 mg/dL (ref 0.61–1.24)
GFR calc Af Amer: >60 mL/min (ref >60)
GFR calc non Af Amer: >60 mL/min (ref >60)
GLUCOSE: 297 mg/dL — AB (ref 65–99)
POTASSIUM: 4.1 mmol/L (ref 3.5–5.1)
SODIUM: 132 mmol/L — AB (ref 135–145)
Total Bilirubin: 1.5 mg/dL — ABNORMAL HIGH (ref 0.3–1.2)
Total Protein: 7.2 g/dL (ref 6.5–8.1)

## 2017-03-22 LAB — CBC WITH DIFFERENTIAL/PLATELET
BASOS ABS: 0 10*3/uL (ref 0–0.1)
BASOS PCT: 0 %
EOS ABS: 0 10*3/uL (ref 0–0.7)
Eosinophils Relative: 0 %
HEMATOCRIT: 44.7 % (ref 40.0–52.0)
HEMOGLOBIN: 15.5 g/dL (ref 13.0–18.0)
Lymphocytes Relative: 5 %
Lymphs Abs: 0.6 10*3/uL — ABNORMAL LOW (ref 1.0–3.6)
MCH: 32.7 pg (ref 26.0–34.0)
MCHC: 34.7 g/dL (ref 32.0–36.0)
MCV: 94.4 fL (ref 80.0–100.0)
MONOS PCT: 3 %
Monocytes Absolute: 0.4 10*3/uL (ref 0.2–1.0)
NEUTROS ABS: 11.4 10*3/uL — AB (ref 1.4–6.5)
Neutrophils Relative %: 92 %
Platelets: 134 10*3/uL — ABNORMAL LOW (ref 150–440)
RBC: 4.74 MIL/uL (ref 4.40–5.90)
RDW: 14.8 % — ABNORMAL HIGH (ref 11.5–14.5)
WBC: 12.5 10*3/uL — ABNORMAL HIGH (ref 3.8–10.6)

## 2017-03-22 MED ORDER — KETOROLAC TROMETHAMINE 30 MG/ML IJ SOLN
15.0000 mg | Freq: Once | INTRAMUSCULAR | Status: AC
  Administered 2017-03-22: 15 mg via INTRAVENOUS
  Filled 2017-03-22: qty 1

## 2017-03-22 MED ORDER — NAPROXEN 500 MG PO TABS: 500.0000 mg | ORAL_TABLET | Freq: Two times a day (BID) | ORAL | 0 refills | Status: AC

## 2017-03-22 MED ORDER — MORPHINE SULFATE (PF) 4 MG/ML IV SOLN
4.0000 mg | INTRAVENOUS | Status: DC | PRN
  Administered 2017-03-22: 4 mg via INTRAVENOUS
  Filled 2017-03-22: qty 1

## 2017-03-22 MED ORDER — ONDANSETRON HCL 4 MG/2ML IJ SOLN
4.0000 mg | Freq: Once | INTRAMUSCULAR | Status: AC
Start: 2017-03-22 — End: 2017-03-22
  Administered 2017-03-22: 4 mg via INTRAVENOUS
  Filled 2017-03-22: qty 2

## 2017-03-22 MED ORDER — CLINDAMYCIN PHOSPHATE 600 MG/50ML IV SOLN
600.0000 mg | Freq: Once | INTRAVENOUS | Status: AC
  Administered 2017-03-22: 600 mg via INTRAVENOUS
  Filled 2017-03-22: qty 50

## 2017-03-22 MED ORDER — CLINDAMYCIN HCL 300 MG PO CAPS: 300.0000 mg | ORAL_CAPSULE | Freq: Three times a day (TID) | ORAL | 0 refills | Status: AC

## 2017-03-22 NOTE — ED Triage Notes (Signed)
Having a flare up of CRPS.  Redness to right lower leg.

## 2017-03-22 NOTE — ED Notes (Signed)
NAD noted at time of D/C. Pt denies questions or concerns. Pt taken to the lobby via wheelchair at this time.  

## 2017-03-22 NOTE — ED Provider Notes (Signed)
[facility] Emergency Department Provider Note    First MD Initiated Contact with Patient 03/22/17 1419     (approximate)  I have reviewed the triage vital signs and the nursing notes.   HISTORY  Chief Complaint Leg Swelling    HPI Maxx Pham is a 56 y.o. male who presents with acute redness and pain to the right lower extremity. Patient states that the redness and pain started yesterday. Has been suffering from bouts of this for several years. Has had vascular workup as an outpatient which was reportedly normal. No fevers. No blistering or drainage.   He is diagnosed with complex regional pain syndrome for this complaint. Denies any other complaints at this time. No history of palpitations. Not on any blood thinners. He does not smoke.   Past Medical History:  Diagnosis Date  . Complex regional pain syndrome   . Diabetes mellitus without complication (HCC)    type 2  . Diverticulosis   . DJD (degenerative joint disease), lumbar   . Hypertension    Family History  Problem Relation Age of Onset  . Cancer Mother        skin Ca  . Cancer Father        basal cell , nose    Past Surgical History:  Procedure Laterality Date  . HERNIA REPAIR  2009   revision left sided inguinal at [facility]  . VASECTOMY     Patient Active Problem List   Diagnosis Date Noted  . Hyperlipidemia associated with type 2 diabetes mellitus (HCC) 02/15/2015  . Preoperative evaluation of a medical condition to rule out surgical contraindications (TAR required) 05/13/2014  . Candidiasis, intertriginous 05/13/2014  . Degenerative disk disease 02/26/2014  . Lumbar spinal stenosis 02/03/2014  . DM type 2 (diabetes mellitus, type 2) (HCC) 05/22/2013  . Allergic rhinitis 12/25/2012  . Dyspnea 08/17/2012  . HTN (hypertension) 05/13/2012  . OSA (obstructive sleep apnea) 05/13/2012  . Obesity (BMI 30-39.9) 05/13/2012  . Lower extremity venous stasis 05/13/2012  .  Diverticulosis       Prior to Admission medications   Medication Sig Start Date End Date Taking? Authorizing Provider  albuterol (PROVENTIL HFA;VENTOLIN HFA) 108 (90 Base) MCG/ACT inhaler Inhale 2 puffs into the lungs every 6 (six) hours as needed for wheezing or shortness of breath. 10/17/16   Menshew, Charlesetta Ivory, PA-C  ALFALFA PO Take 2 tablets by mouth 3 (three) times daily.    [provider]  atorvastatin (LIPITOR) 20 MG tablet Take 1 tablet (20 mg total) by mouth daily. 02/15/15   Sherlene Shams, MD  benzonatate (TESSALON PERLES) 100 MG capsule Take 1 capsule (100 mg total) by mouth 3 (three) times daily as needed for cough (Take 1-2 per dose). 10/17/16   Menshew, Charlesetta Ivory, PA-C  clindamycin (CLEOCIN) 300 MG capsule Take 1 capsule (300 mg total) by mouth 3 (three) times daily. 03/22/17 04/01/17  Willy Eddy, MD  cyclobenzaprine (FLEXERIL) 5 MG tablet Take 1 tablet (5 mg total) by mouth 3 (three) times daily as needed for muscle spasms. 01/22/17   Menshew, Charlesetta Ivory, PA-C  fluticasone (FLONASE) 50 MCG/ACT nasal spray Place 2 sprays into both nostrils daily. 10/17/16   Menshew, Charlesetta Ivory, PA-C  furosemide (LASIX) 20 MG tablet Take 1 tablet (20 mg total) by mouth daily. As needed for fluid retention 02/13/15   Sherlene Shams, MD  glipiZIDE (GLUCOTROL) 5 MG tablet TAKE ONE TABLET BY MOUTH TWICE  DAILY BEFORE A MEAL 09/13/14   Sherlene Shamsullo, Teresa L, MD  ketorolac (TORADOL) 10 MG tablet Take 1 tablet (10 mg total) by mouth every 8 (eight) hours. 01/22/17   Menshew, Charlesetta IvoryJenise V Bacon, PA-C  losartan (COZAAR) 25 MG tablet TAKE ONE TABLET BY MOUTH ONCE DAILY 03/09/15   Sherlene Shamsullo, Teresa L, MD  metFORMIN (GLUCOPHAGE) 500 MG tablet TAKE ONE TABLET BY MOUTH TWICE DAILY WITH MEALS 10/09/14   Sherlene Shamsullo, Teresa L, MD  methocarbamol (ROBAXIN-750) 750 MG tablet Take 1 tablet (750 mg total) by mouth 4 (four) times daily. Patient not taking: Reported on 02/13/2015 09/06/13   Lorre NickAllen, Anthony, MD    Multiple Vitamin (MULTIVITAMIN) tablet Take 1 tablet by mouth daily.    [provider]  naproxen (NAPROSYN) 500 MG tablet Take 1 tablet (500 mg total) by mouth 2 (two) times daily with a meal. 03/22/17 03/22/18  Willy Eddyobinson, Dedee Liss, MD  nystatin cream (MYCOSTATIN) Apply 1 application topically 2 (two) times daily. Until rash has resolved Patient not taking: Reported on 02/13/2015 05/10/14   Sherlene Shamsullo, Teresa L, MD  oxyCODONE-acetaminophen (PERCOCET/ROXICET) 5-325 MG per tablet Take 2 tablets by mouth every 4 (four) hours as needed for severe pain. Patient not taking: Reported on 02/13/2015 09/06/13   Lorre NickAllen, Anthony, MD  traMADol (ULTRAM) 50 MG tablet Take 1 tablet (50 mg total) by mouth every 8 (eight) hours as needed. 02/13/15   Sherlene Shamsullo, Teresa L, MD    Allergies Lisinopril    Social History Social History  Substance Use Topics  . Smoking status: Former Smoker    Packs/day: 1.00    Years: 25.00    Types: Cigarettes    Quit date: 11/03/2000  . Smokeless tobacco: Never Used  . Alcohol use No    Review of Systems Patient denies headaches, rhinorrhea, blurry vision, numbness, shortness of breath, chest pain, edema, cough, abdominal pain, nausea, vomiting, diarrhea, dysuria, fevers, rashes or hallucinations unless otherwise stated above in HPI. ____________________________________________   PHYSICAL EXAM:  VITAL SIGNS: Vitals:   03/22/17 1559 03/22/17 1609  BP: 119/88   Pulse: (!) 102 98  Resp: 18   Temp: 98.4 F (36.9 C)     Constitutional: Alert and oriented. Well appearing and in no acute distress. Eyes: Conjunctivae are normal. PERRL. EOMI. Head: Atraumatic. Nose: No congestion/rhinnorhea. Mouth/Throat: Mucous membranes are moist.  Oropharynx non-erythematous. Neck: No stridor. Painless ROM. No cervical spine tenderness to palpation Hematological/Lymphatic/Immunilogical: No cervical lymphadenopathy. Cardiovascular: Normal rate, regular rhythm. Grossly normal heart sounds.   Good peripheral circulation. Respiratory: Normal respiratory effort.  No retractions. Lungs CTAB. Gastrointestinal: Soft and nontender. No distention. No abdominal bruits. No CVA tenderness. Musculoskeletal: redness and ttp to rigth leg, no blistering or crepitus.  Brisk cap refill.  Erythema spares the ankle.  R>L swelling Neurologic:  Normal speech and language. No gross focal neurologic deficits are appreciated. No gait instability. Skin:  Skin is warm, dry and intact. No rash noted. Psychiatric: Mood and affect are normal. Speech and behavior are normal.  ____________________________________________   LABS (all labs ordered are listed, but only abnormal results are displayed)  Results for orders placed or performed during the hospital encounter of 03/22/17 (from the past 24 hour(s))  CBC with Differential     Status: Abnormal   Collection Time: 03/22/17 10:49 AM  Result Value Ref Range   WBC 12.5 (H) 3.8 - 10.6 K/uL   RBC 4.74 4.40 - 5.90 MIL/uL   Hemoglobin 15.5 13.0 - 18.0 g/dL   HCT 54.044.7 98.140.0 -  52.0 %   MCV 94.4 80.0 - 100.0 fL   MCH 32.7 26.0 - 34.0 pg   MCHC 34.7 32.0 - 36.0 g/dL   RDW 62.9 (H) 52.8 - 41.3 %   Platelets 134 (L) 150 - 440 K/uL   Neutrophils Relative % 92 %   Neutro Abs 11.4 (H) 1.4 - 6.5 K/uL   Lymphocytes Relative 5 %   Lymphs Abs 0.6 (L) 1.0 - 3.6 K/uL   Monocytes Relative 3 %   Monocytes Absolute 0.4 0.2 - 1.0 K/uL   Eosinophils Relative 0 %   Eosinophils Absolute 0.0 0 - 0.7 K/uL   Basophils Relative 0 %   Basophils Absolute 0.0 0 - 0.1 K/uL  Comprehensive metabolic panel     Status: Abnormal   Collection Time: 03/22/17 10:49 AM  Result Value Ref Range   Sodium 132 (L) 135 - 145 mmol/L   Potassium 4.1 3.5 - 5.1 mmol/L   Chloride 99 (L) 101 - 111 mmol/L   CO2 24 22 - 32 mmol/L   Glucose, Bld 297 (H) 65 - 99 mg/dL   BUN 18 6 - 20 mg/dL   Creatinine, Ser 2.44 0.61 - 1.24 mg/dL   Calcium 8.9 8.9 - 01.0 mg/dL   Total Protein 7.2 6.5 - 8.1 g/dL    Albumin 3.8 3.5 - 5.0 g/dL   AST 35 15 - 41 U/L   ALT 36 17 - 63 U/L   Alkaline Phosphatase 62 38 - 126 U/L   Total Bilirubin 1.5 (H) 0.3 - 1.2 mg/dL   GFR calc non Af Amer >60 >60 mL/min   GFR calc Af Amer >60 >60 mL/min   Anion gap 9 5 - 15   ____________________________________________  EKG____________________________________________  RADIOLOGY   ____________________________________________   PROCEDURES  Procedure(s) performed:  Procedures    Critical Care performed: no ____________________________________________   INITIAL IMPRESSION / ASSESSMENT AND PLAN / ED COURSE  Pertinent labs & imaging results that were available during my care of the patient were reviewed by me and considered in my medical decision making (see chart for details).  DDX: cellulitis, stasis derematitis, dvt, claudication  Angie Hogg is a 56 y.o. who presents to the ED with Leg swelling and redness as described above.  Patient is AFVSS in ED. Exam as above. Given current presentation have considered the above differential.  Exam is certainly concerning for component of cellulitis to the patient is not overtly septic appearing. He's got good peripheral pulses. Possible stasis dermatitis but it seems to have occurred rapidly. Will order ultrasound evaluate for DVT. We'll give dose of antibiotics for component of infection.  The patient will be placed on continuous pulse oximetry and telemetry for monitoring.  Laboratory evaluation will be sent to evaluate for the above complaints.     Clinical Course as of Mar 22 1609  Sun Mar 22, 2017  1521 Patient reassessed. Ultrasound shows no evidence of blood clot. Has good distal peripheral perfusion. Patient receiving IV antibiotics and is in no acute distress. Differential still remains between complex regional pain syndrome versus cellulitis. Patient's pain has improved. The patient is stable for discharge for further outpatient management.  Have  discussed with the patient and available family all diagnostics and treatments performed thus far and all questions were answered to the best of my ability. The patient demonstrates understanding and agreement with plan.   [PR]    Clinical Course User Index [PR] Willy Eddy, MD     ____________________________________________  FINAL CLINICAL IMPRESSION(S) / ED DIAGNOSES  Final diagnoses:  Right leg swelling  Cellulitis of right lower extremity      NEW MEDICATIONS STARTED DURING THIS VISIT:  New Prescriptions   CLINDAMYCIN (CLEOCIN) 300 MG CAPSULE    Take 1 capsule (300 mg total) by mouth 3 (three) times daily.   NAPROXEN (NAPROSYN) 500 MG TABLET    Take 1 tablet (500 mg total) by mouth 2 (two) times daily with a meal.     Note:  This document was prepared using Dragon voice recognition software and may include unintentional dictation errors.    Willy Eddy, MD 03/22/17 [phone]

## 2017-03-29 ENCOUNTER — Emergency Department: Payer: Non-veteran care

## 2017-03-29 ENCOUNTER — Emergency Department
Admission: EM | Admit: 2017-03-29 | Discharge: 2017-03-29 | Disposition: A | Discharge status: Other Acute Inpatient Hospital | Payer: Non-veteran care | Attending: Emergency Medicine | Admitting: Emergency Medicine

## 2017-03-29 DIAGNOSIS — Y998 Other external cause status: Secondary | ICD-10-CM | POA: Insufficient documentation

## 2017-03-29 DIAGNOSIS — Y929 Unspecified place or not applicable: Secondary | ICD-10-CM | POA: Diagnosis not present

## 2017-03-29 DIAGNOSIS — I10 Essential (primary) hypertension: Secondary | ICD-10-CM | POA: Diagnosis not present

## 2017-03-29 DIAGNOSIS — R079 Chest pain, unspecified: Secondary | ICD-10-CM | POA: Diagnosis present

## 2017-03-29 DIAGNOSIS — Z7984 Long term (current) use of oral hypoglycemic drugs: Secondary | ICD-10-CM | POA: Diagnosis not present

## 2017-03-29 DIAGNOSIS — L03115 Cellulitis of right lower limb: Secondary | ICD-10-CM | POA: Diagnosis not present

## 2017-03-29 DIAGNOSIS — Y9389 Activity, other specified: Secondary | ICD-10-CM | POA: Diagnosis not present

## 2017-03-29 DIAGNOSIS — Z87891 Personal history of nicotine dependence: Secondary | ICD-10-CM | POA: Diagnosis not present

## 2017-03-29 DIAGNOSIS — Z1620 Resistance to unspecified antibiotic: Secondary | ICD-10-CM | POA: Diagnosis not present

## 2017-03-29 DIAGNOSIS — W19XXXA Unspecified fall, initial encounter: Secondary | ICD-10-CM

## 2017-03-29 DIAGNOSIS — Z79899 Other long term (current) drug therapy: Secondary | ICD-10-CM | POA: Diagnosis not present

## 2017-03-29 DIAGNOSIS — S93401A Sprain of unspecified ligament of right ankle, initial encounter: Secondary | ICD-10-CM | POA: Diagnosis not present

## 2017-03-29 DIAGNOSIS — E119 Type 2 diabetes mellitus without complications: Secondary | ICD-10-CM | POA: Insufficient documentation

## 2017-03-29 DIAGNOSIS — W108XXA Fall (on) (from) other stairs and steps, initial encounter: Secondary | ICD-10-CM | POA: Insufficient documentation

## 2017-03-29 LAB — BASIC METABOLIC PANEL
Anion gap: 10 (ref 5–15)
BUN: 19 mg/dL (ref 6–20)
CALCIUM: 8.7 mg/dL — AB (ref 8.9–10.3)
CO2: 27 mmol/L (ref 22–32)
CREATININE: 1.18 mg/dL (ref 0.61–1.24)
Chloride: 100 mmol/L — ABNORMAL LOW (ref 101–111)
GFR calc Af Amer: >60 mL/min (ref >60)
GLUCOSE: 356 mg/dL — AB (ref 65–99)
POTASSIUM: 4.4 mmol/L (ref 3.5–5.1)
SODIUM: 137 mmol/L (ref 135–145)

## 2017-03-29 LAB — CBC
HEMATOCRIT: 42.1 % (ref 40.0–52.0)
Hemoglobin: 14.5 g/dL (ref 13.0–18.0)
MCH: 32.6 pg (ref 26.0–34.0)
MCHC: 34.4 g/dL (ref 32.0–36.0)
MCV: 94.7 fL (ref 80.0–100.0)
PLATELETS: 211 10*3/uL (ref 150–440)
RBC: 4.45 MIL/uL (ref 4.40–5.90)
RDW: 14.5 % (ref 11.5–14.5)
WBC: 6.2 10*3/uL (ref 3.8–10.6)

## 2017-03-29 LAB — TROPONIN I

## 2017-03-29 LAB — GLUCOSE, CAPILLARY: Glucose-Capillary: 291 mg/dL — ABNORMAL HIGH (ref 65–99)

## 2017-03-29 MED ORDER — VANCOMYCIN HCL IN DEXTROSE 1-5 GM/200ML-% IV SOLN
1000.0000 mg | Freq: Once | INTRAVENOUS | Status: AC
  Administered 2017-03-29: 1000 mg via INTRAVENOUS
  Filled 2017-03-29: qty 200

## 2017-03-29 MED ORDER — GLIPIZIDE 5 MG PO TABS
5.0000 mg | ORAL_TABLET | Freq: Two times a day (BID) | ORAL | Status: DC
  Filled 2017-03-29: qty 1

## 2017-03-29 MED ORDER — METFORMIN HCL 500 MG PO TABS: 500.0000 mg | ORAL_TABLET | Freq: Two times a day (BID) | ORAL | Status: DC

## 2017-03-29 MED ORDER — OXYCODONE-ACETAMINOPHEN 5-325 MG PO TABS
2.0000 | ORAL_TABLET | ORAL | Status: AC
  Administered 2017-03-29: 2 via ORAL
  Filled 2017-03-29: qty 2

## 2017-03-29 MED ORDER — GLIPIZIDE 5 MG PO TABS
5.0000 mg | ORAL_TABLET | Freq: Two times a day (BID) | ORAL | Status: DC
  Administered 2017-03-29: 5 mg via ORAL

## 2017-03-29 MED ORDER — METFORMIN HCL 500 MG PO TABS
ORAL_TABLET | ORAL | Status: AC
  Filled 2017-03-29: qty 1

## 2017-03-29 MED ORDER — METFORMIN HCL 500 MG PO TABS
500.0000 mg | ORAL_TABLET | Freq: Two times a day (BID) | ORAL | Status: DC
  Administered 2017-03-29: 500 mg via ORAL

## 2017-03-29 NOTE — ED Notes (Signed)
Called pharmacy to send glipizide and metformin

## 2017-03-29 NOTE — ED Notes (Signed)
Pt reports that he fell and twisted his right ankle and hurt his lower back and neck - pt states that he lost his balance earlier today and fell off of the front porch - pt states he can bear weight equally and walk with minimal difficulty - slight limp noted to right ankle

## 2017-03-29 NOTE — ED Notes (Signed)
[facility] Lorenza ChickQuale's orders.

## 2017-03-29 NOTE — ED Provider Notes (Signed)
[facility] Emergency Department Provider Note   ____________________________________________   First MD Initiated Contact with Patient 03/29/17 1835     (approximate)  I have reviewed the triage vital signs and the nursing notes.   HISTORY  Chief Complaint Fall; Dizziness; and Chest Pain    HPI Steven Holmes is a 56 y.o. male here for evaluation of a fall and ongoing redness and swelling in his right leg  The patient reports that he was walking down steps, he sort of stumbled and missed a step, falling and twisting his right ankle. He also reports that hepain in his lower back which she describes as an uncomfortable feeling, but primarily pain over the left ankle. No numbness tingling or weakness. Pain does limit his motion of the right ankle. Pain described as moderate.  No nausea vomiting. Did not lose consciousness or strike his head. He did report that he had a brief sensation of discomfort in his chest, reports he was very minor and is gone now. No trouble breathing. No abdominal pain or vomiting.  10 ongoing swelling and redness over his right lower leg. He reports previous infections and cellulitis there, been on clindamycin for 7 days without improvement   Past Medical History:  Diagnosis Date  . Complex regional pain syndrome   . Diabetes mellitus without complication (HCC)    type 2  . Diverticulosis   . DJD (degenerative joint disease), lumbar   . Hypertension     Patient Active Problem List   Diagnosis Date Noted  . Hyperlipidemia associated with type 2 diabetes mellitus (HCC) 02/15/2015  . Preoperative evaluation of a medical condition to rule out surgical contraindications (TAR required) 05/13/2014  . Candidiasis, intertriginous 05/13/2014  . Degenerative disk disease 02/26/2014  . Lumbar spinal stenosis 02/03/2014  . DM type 2 (diabetes mellitus, type 2) (HCC) 05/22/2013  . Allergic rhinitis 12/25/2012  . Dyspnea  08/17/2012  . HTN (hypertension) 05/13/2012  . OSA (obstructive sleep apnea) 05/13/2012  . Obesity (BMI 30-39.9) 05/13/2012  . Lower extremity venous stasis 05/13/2012  . Diverticulosis     Past Surgical History:  Procedure Laterality Date  . HERNIA REPAIR  2009   revision left sided inguinal at [facility]  . VASECTOMY      Prior to Admission medications   Medication Sig Start Date End Date Taking? Authorizing Provider  albuterol (PROVENTIL HFA;VENTOLIN HFA) 108 (90 Base) MCG/ACT inhaler Inhale 2 puffs into the lungs every 6 (six) hours as needed for wheezing or shortness of breath. 10/17/16   Menshew, Charlesetta Ivory, PA-C  ALFALFA PO Take 2 tablets by mouth 3 (three) times daily.    [provider]  atorvastatin (LIPITOR) 20 MG tablet Take 1 tablet (20 mg total) by mouth daily. 02/15/15   Sherlene Shams, MD  benzonatate (TESSALON PERLES) 100 MG capsule Take 1 capsule (100 mg total) by mouth 3 (three) times daily as needed for cough (Take 1-2 per dose). 10/17/16   Menshew, Charlesetta Ivory, PA-C  clindamycin (CLEOCIN) 300 MG capsule Take 1 capsule (300 mg total) by mouth 3 (three) times daily. 03/22/17 04/01/17  Willy Eddy, MD  cyclobenzaprine (FLEXERIL) 5 MG tablet Take 1 tablet (5 mg total) by mouth 3 (three) times daily as needed for muscle spasms. 01/22/17   Menshew, Charlesetta Ivory, PA-C  fluticasone (FLONASE) 50 MCG/ACT nasal spray Place 2 sprays into both nostrils daily. 10/17/16   Menshew, Charlesetta Ivory, PA-C  furosemide (LASIX) 20 MG  tablet Take 1 tablet (20 mg total) by mouth daily. As needed for fluid retention 02/13/15   Sherlene Shamsullo, Teresa L, MD  glipiZIDE (GLUCOTROL) 5 MG tablet TAKE ONE TABLET BY MOUTH TWICE DAILY BEFORE A MEAL 09/13/14   Sherlene Shamsullo, Teresa L, MD  ketorolac (TORADOL) 10 MG tablet Take 1 tablet (10 mg total) by mouth every 8 (eight) hours. 01/22/17   Menshew, Charlesetta IvoryJenise V Bacon, PA-C  losartan (COZAAR) 25 MG tablet TAKE ONE TABLET BY MOUTH ONCE DAILY 03/09/15   Sherlene Shamsullo,  Teresa L, MD  metFORMIN (GLUCOPHAGE) 500 MG tablet TAKE ONE TABLET BY MOUTH TWICE DAILY WITH MEALS 10/09/14   Sherlene Shamsullo, Teresa L, MD  methocarbamol (ROBAXIN-750) 750 MG tablet Take 1 tablet (750 mg total) by mouth 4 (four) times daily. Patient not taking: Reported on 02/13/2015 09/06/13   Lorre NickAllen, Anthony, MD  Multiple Vitamin (MULTIVITAMIN) tablet Take 1 tablet by mouth daily.    [provider]  naproxen (NAPROSYN) 500 MG tablet Take 1 tablet (500 mg total) by mouth 2 (two) times daily with a meal. 03/22/17 03/22/18  Willy Eddyobinson, Patrick, MD  nystatin cream (MYCOSTATIN) Apply 1 application topically 2 (two) times daily. Until rash has resolved Patient not taking: Reported on 02/13/2015 05/10/14   Sherlene Shamsullo, Teresa L, MD  oxyCODONE-acetaminophen (PERCOCET/ROXICET) 5-325 MG per tablet Take 2 tablets by mouth every 4 (four) hours as needed for severe pain. Patient not taking: Reported on 02/13/2015 09/06/13   Lorre NickAllen, Anthony, MD  traMADol (ULTRAM) 50 MG tablet Take 1 tablet (50 mg total) by mouth every 8 (eight) hours as needed. 02/13/15   Sherlene Shamsullo, Teresa L, MD    Allergies Lisinopril  Family History  Problem Relation Age of Onset  . Cancer Mother        skin Ca  . Cancer Father        basal cell , nose     Social History Social History  Substance Use Topics  . Smoking status: Former Smoker    Packs/day: 1.00    Years: 25.00    Types: Cigarettes    Quit date: 11/03/2000  . Smokeless tobacco: Never Used  . Alcohol use No    Review of Systems Constitutional: No fever/chills Eyes: No visual changes. ENT: No sore throat. Cardiovascular: Denies chest pain. Respiratory: Denies shortness of breath. Gastrointestinal: No abdominal pain.  No nausea, no vomiting.  No diarrhea.  No constipation. Genitourinary: Negative for dysuria. Musculoskeletal: See history of present illness Skin: See history of present illness Neurological: Negative for headaches, focal weakness or  numbness.    ____________________________________________   PHYSICAL EXAM:  VITAL SIGNS: ED Triage Vitals  Enc Vitals Group     BP 03/29/17 1703 130/80     Pulse Rate 03/29/17 1703 99     Resp 03/29/17 1703 18     Temp 03/29/17 1703 98.2 F (36.8 C)     Temp Source 03/29/17 1703 Oral     SpO2 03/29/17 1703 97 %     Weight --      Height --      Head Circumference --      Peak Flow --      Pain Score 03/29/17 1707 7     Pain Loc --      Pain Edu? --      Excl. in GC? --     Constitutional: Alert and oriented. Well appearing and in no acute distress. Eyes: Conjunctivae are normal.Pupils equal and reactive Head: Atraumatic.Extraocular movements intact. Nose: No congestion/rhinnorhea. Mouth/Throat:  Mucous membranes are moist. Neck: No stridor.  No cervical tenderness. Full range of motion neck without pain. Cardiovascular: Normal rate, regular rhythm. Grossly normal heart sounds.  Good peripheral circulation. Respiratory: Normal respiratory effort.  No retractions. Lungs CTAB. Gastrointestinal: Soft and nontender.  Musculoskeletal:   Lower Extremities  No edema. Normal DP/PT pulses bilateral with good cap refill.  Normal neuro-motor function lower extremities bilateral.  RIGHT Right lower extremity demonstrates normal strength, good use of all muscles. There is no pain to loading of the right hip, no tenderness of the femur. The right knee joint is not swollen or erythematous, and he is able to bend and flex without pain. The right lower extremity from about the tibial plateau to the proximal portion of the foot is erythematous, slightly edematous with pitting edema, slight petechial rash over the base of erythema that is warm and slightly shiny. No tenderness across the foot or toes. The right ankle demonstrates moderate focal tenderness and edema about the ankle joint and also along the medial joint line.  LEFT Left lower extremity demonstrates normal strength, good  use of all muscles. No edema bruising or contusions of the hip,  knee, ankle. Full range of motion of the left lower extremity without pain. No pain on axial loading. No evidence of trauma.   Neurologic:  Normal speech and language. No gross focal neurologic deficits are appreciated.  Skin:  Skin is warm, dry and intact. No rash noted. Psychiatric: Mood and affect are normal. Speech and behavior are normal.  ____________________________________________   LABS (all labs ordered are listed, but only abnormal results are displayed)  Labs Reviewed  BASIC METABOLIC PANEL - Abnormal; Notable for the following:       Result Value   Chloride 100 (*)    Glucose, Bld 356 (*)    Calcium 8.7 (*)    All other components within normal limits  GLUCOSE, CAPILLARY - Abnormal; Notable for the following:    Glucose-Capillary 291 (*)    All other components within normal limits  CULTURE, BLOOD (ROUTINE X 2)  CULTURE, BLOOD (ROUTINE X 2)  CBC  TROPONIN I  CBG MONITORING, ED  CBG MONITORING, ED   ____________________________________________  EKG  Reviewed injury by me at 1710 Ventricular rate 95 PR 120 QRS 75 QTC 450 Normal sinus rhythm, no evidence of ischemia ____________________________________________  RADIOLOGY  Dg Chest 2 View  Result Date: 03/29/2017 CLINICAL DATA:  Chest pain EXAM: CHEST  2 VIEW COMPARISON:  10/17/2016 FINDINGS: Cardiac shadow is stable. Minimal basilar atelectasis is noted on the left. No focal infiltrate or sizable effusion is seen. IMPRESSION: Mild left basilar atelectasis. Electronically Signed   By: Alcide Clever M.D.   On: 03/29/2017 18:05   Dg Lumbar Spine 2-3 Views  Result Date: 03/29/2017 CLINICAL DATA:  Missed a step and fell down 2 steps, low back pain, dizziness EXAM: LUMBAR SPINE - 2-3 VIEW COMPARISON:  MRI lumbar spine 09/06/2013 FINDINGS: Five non-rib-bearing lumbar vertebra. Minimal levoconvex scoliosis apex L3. Disc space narrowing L3-L4. Scattered  mild endplate spurring. Vertebral body heights maintained without fracture or subluxation. No bone destruction or spondylolysis. SI joints preserved. IMPRESSION: Mild scattered degenerative disc disease changes and minimal levoconvex scoliosis. No acute abnormalities. Electronically Signed   By: Ulyses Southward M.D.   On: 03/29/2017 19:34   Dg Tibia/fibula Right  Result Date: 03/29/2017 CLINICAL DATA:  Infection RIGHT lower leg, on clindamycin EXAM: RIGHT TIBIA AND FIBULA - 2 VIEW COMPARISON:  None FINDINGS: Scattered  soft tissue swelling RIGHT lower leg and ankle. Osseous mineralization normal. Joint spaces preserved. No acute fracture, dislocation, or bone destruction. IMPRESSION: Soft tissue swelling without acute bony abnormalities. Electronically Signed   By: Ulyses Southward M.D.   On: 03/29/2017 19:35   Dg Ankle Complete Right  Result Date: 03/29/2017 CLINICAL DATA:  Infection RIGHT lower leg, on clindamycin EXAM: RIGHT ANKLE - COMPLETE 3+ VIEW COMPARISON:  None FINDINGS: Significant soft tissue swelling at RIGHT ankle. Osseous mineralization normal. Joint spaces preserved. No acute fracture, dislocation, or bone destruction. Small plantar and Achilles insertion calcaneal spurs. IMPRESSION: Significant soft tissue swelling without acute bony abnormalities. Electronically Signed   By: Ulyses Southward M.D.   On: 03/29/2017 19:36    ____________________________________________   PROCEDURES  Procedure(s) performed: None  Procedures  Critical Care performed: No  ____________________________________________   INITIAL IMPRESSION / ASSESSMENT AND PLAN / ED COURSE  Pertinent labs & imaging results that were available during my care of the patient were reviewed by me and considered in my medical decision making (see chart for details).  Patient presents for evaluation after a fall. No serious evidence of injury noted on exam except for focal tenderness and swelling of the right ankle without deformity.  Intact distally with regard to neurovascular and motor function. However, he reports worsening redness and infection in his right leg despite 7 days of clindamycin therapy.  This labs are stable, hemodynamic stable, but discussed with the patient given he is diabetic with elevated glucose, failing outpatient and back therapy plan to admit the patient to our hospital. However, it isn't realized by the hospitalist that he has [employer] as his insurer, confirm this with the patient and he is agreeable with plan for transfer and I feel very stable for transfer. Ambulance the [facility]. He can receive ongoing care there, he is agreeable.  Patient agreeable with plan to transfer the [place]. This is dictated by his insurance. Discussed the case with Dr. Fredric Mare the [facility], patient accepted in transfer.    ----------------------------------------- 10:10 PM on 03/29/2017 -----------------------------------------  Patient resting comfortably. Appears stable for transfer via EMS to [facility]. Accepted by Dr. Fredric Mare to the [facility] emergency department. ____________________________________________   FINAL CLINICAL IMPRESSION(S) / ED DIAGNOSES  Final diagnoses:  Therapy failure due to antibiotic resistance  Cellulitis of right lower leg  Fall, initial encounter  Sprain of right ankle, unspecified ligament, initial encounter      NEW MEDICATIONS STARTED DURING THIS VISIT:  New Prescriptions   No medications on file     Note:  This document was prepared using Dragon voice recognition software and may include unintentional dictation errors.     Sharyn Creamer, MD 03/29/17 2226

## 2017-03-29 NOTE — ED Notes (Signed)
Per Dr Fanny BienQuale not to apply ankle splint but rather to apply ace wrap to ankle area - ace wrap applied per VO Dr Fanny BienQuale

## 2017-03-29 NOTE — ED Notes (Signed)
[facility] has been called to transport patient [facility]

## 2017-03-29 NOTE — ED Triage Notes (Signed)
Pt is currently taking clindamycin for infection to the rt lower leg, pt states that it isn't getting better, pt also states that pta he had some dizziness, miss stepped and fell down 2 steps, hitting the back of his head on the car, twisting the already bad rt ankle, hitting his lower back and the back of his neck, pt also states prior to triage he experienced some chest pain that has stopped. Rt lower extremity is red and swollen from the foot to the knee

## 2017-04-03 LAB — CULTURE, BLOOD (ROUTINE X 2)
CULTURE: NO GROWTH
Culture: NO GROWTH

## 2017-07-17 IMAGING — DX DG SHOULDER 2+V*R*
4 series · 4 of 4 positions shown · non-contrast
Comparison: None.

CLINICAL DATA: Fall 5 days ago with right arm and shoulder pain.

EXAM:
RIGHT SHOULDER - 2+ VIEW

[shoulder grashey]
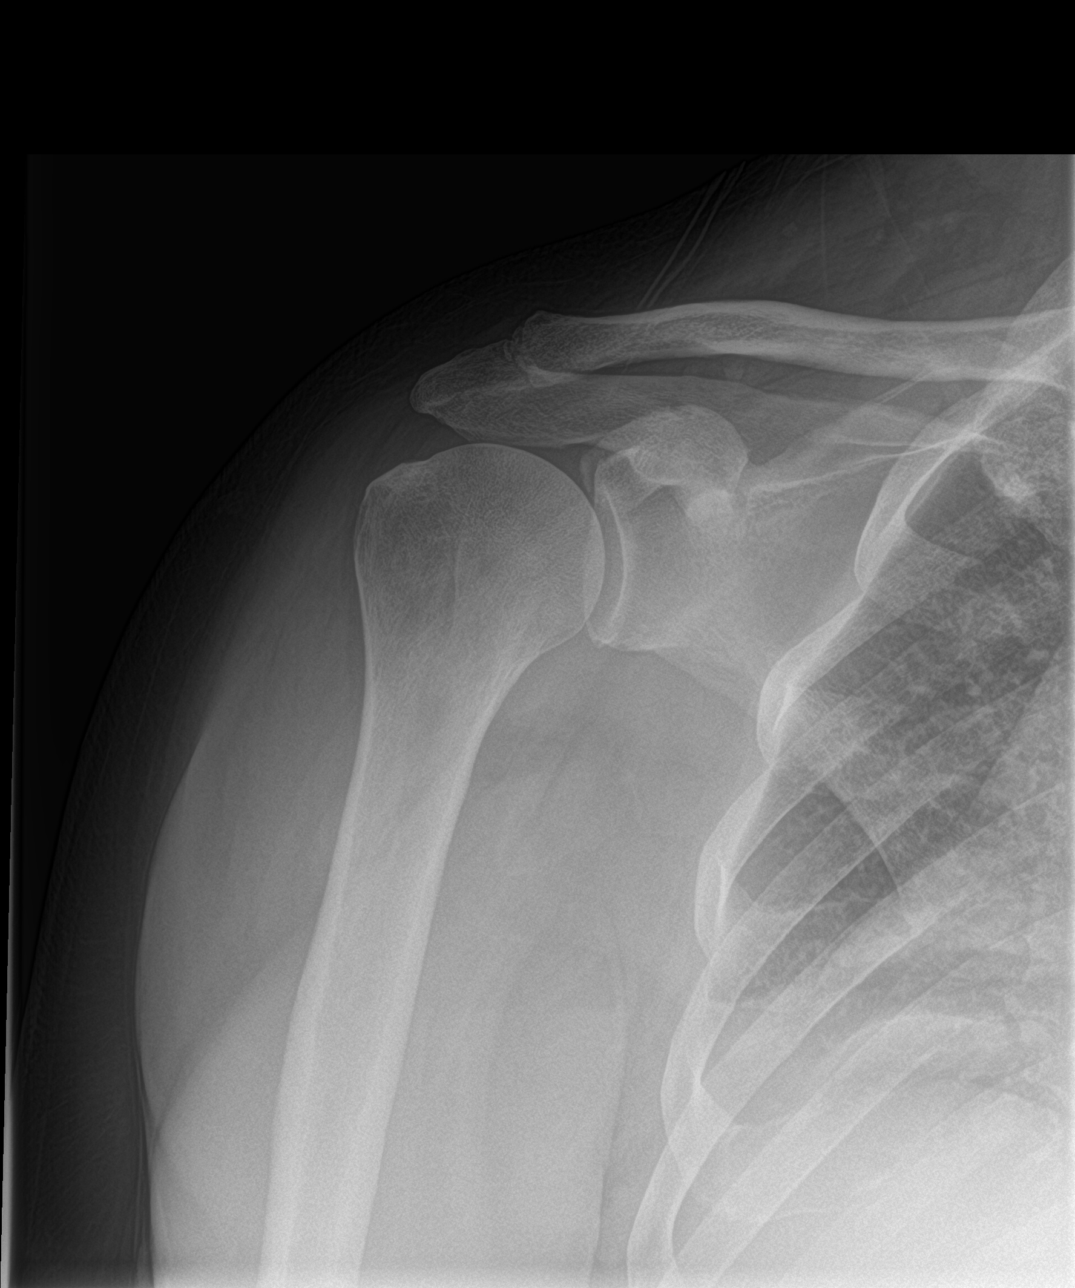

[shoulder y view (1 of 2)]
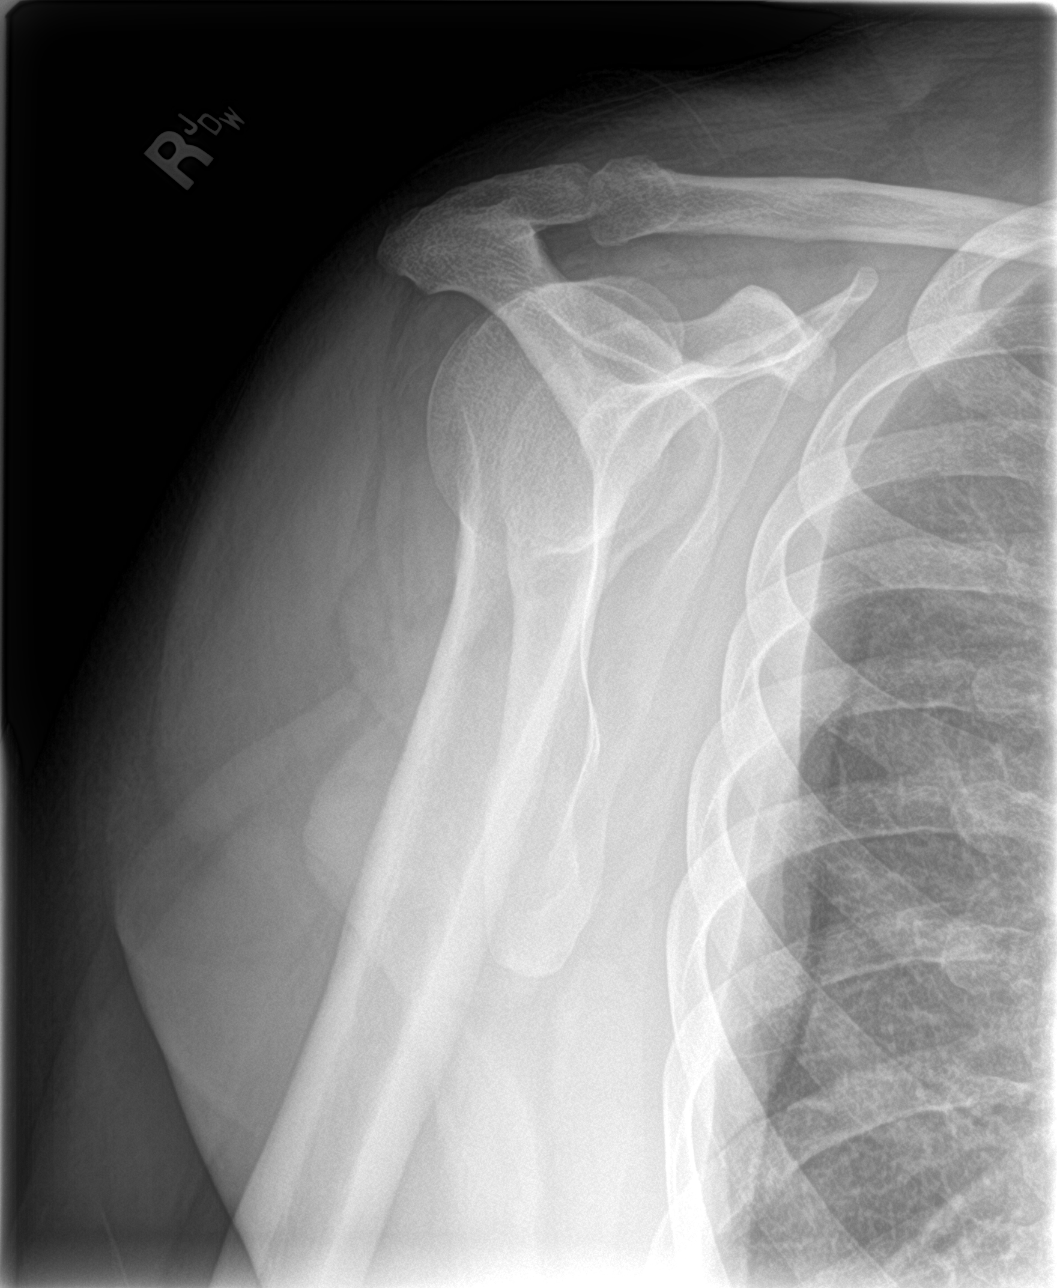

[shoulder axillary]
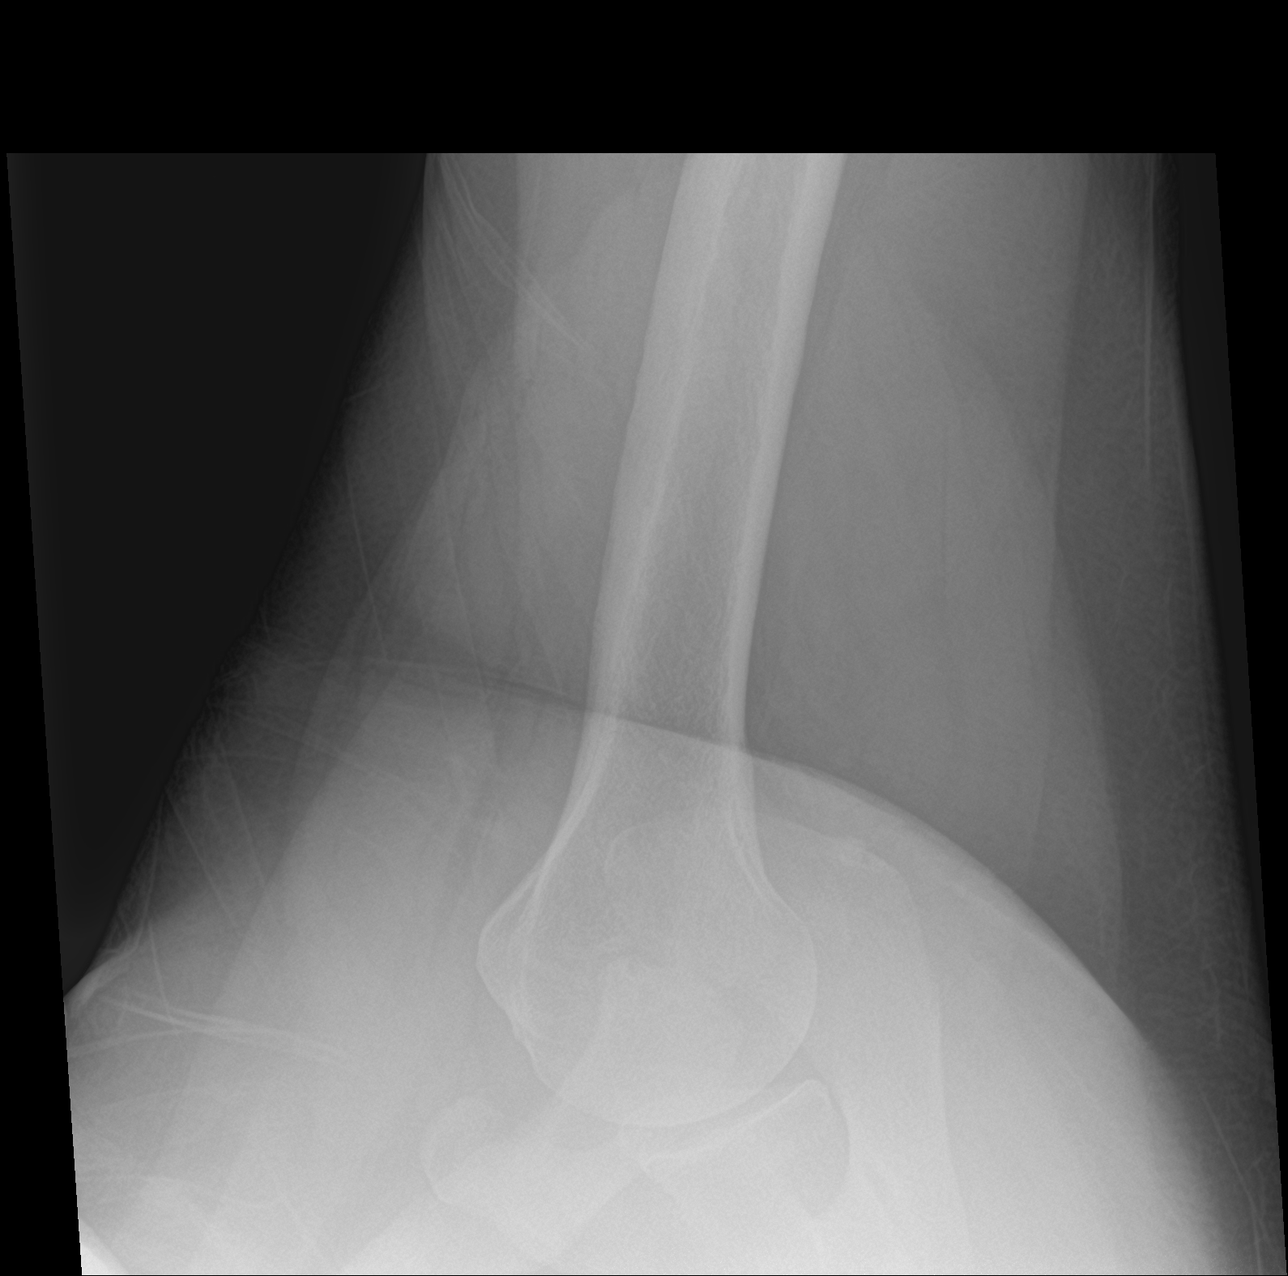

[shoulder y view (2 of 2)]
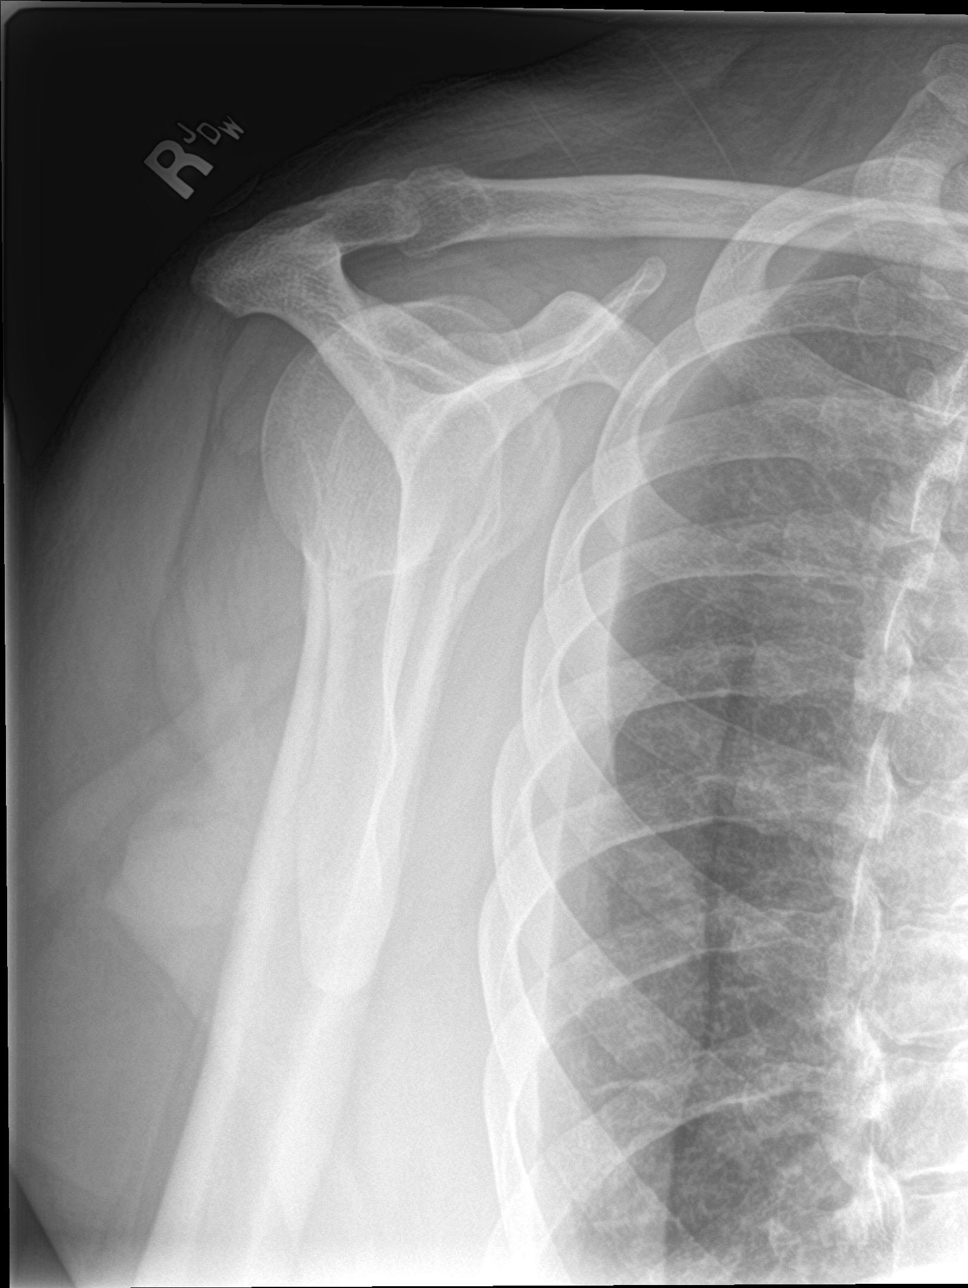

[4 of 4 positions shown; findings below may reference images not displayed]

FINDINGS: There is no evidence of fracture or dislocation. There is no
evidence of arthropathy or other focal bone abnormality. Soft
tissues are unremarkable.
IMPRESSION: Negative.

## 2019-10-08 ENCOUNTER — Encounter: Payer: Self-pay | Admitting: Emergency Medicine

## 2019-10-08 ENCOUNTER — Emergency Department: Payer: No Typology Code available for payment source

## 2019-10-08 ENCOUNTER — Other Ambulatory Visit: Payer: Self-pay

## 2019-10-08 ENCOUNTER — Emergency Department
Admission: EM | Admit: 2019-10-08 | Discharge: 2019-10-08 | Disposition: A | Discharge status: Home / Self Care | Payer: No Typology Code available for payment source | Attending: Emergency Medicine | Admitting: Emergency Medicine

## 2019-10-08 DIAGNOSIS — R059 Cough, unspecified: Secondary | ICD-10-CM

## 2019-10-08 DIAGNOSIS — Z87891 Personal history of nicotine dependence: Secondary | ICD-10-CM | POA: Insufficient documentation

## 2019-10-08 DIAGNOSIS — Z7984 Long term (current) use of oral hypoglycemic drugs: Secondary | ICD-10-CM | POA: Insufficient documentation

## 2019-10-08 DIAGNOSIS — Z79899 Other long term (current) drug therapy: Secondary | ICD-10-CM | POA: Diagnosis not present

## 2019-10-08 DIAGNOSIS — E119 Type 2 diabetes mellitus without complications: Secondary | ICD-10-CM | POA: Diagnosis not present

## 2019-10-08 DIAGNOSIS — U071 COVID-19: Secondary | ICD-10-CM | POA: Diagnosis not present

## 2019-10-08 DIAGNOSIS — R0789 Other chest pain: Secondary | ICD-10-CM

## 2019-10-08 DIAGNOSIS — I10 Essential (primary) hypertension: Secondary | ICD-10-CM | POA: Diagnosis not present

## 2019-10-08 DIAGNOSIS — R05 Cough: Secondary | ICD-10-CM

## 2019-10-08 HISTORY — DX: COVID-19: U07.1

## 2019-10-08 LAB — CBC
HCT: 46.8 % (ref 39.0–52.0)
Hemoglobin: 16.7 g/dL (ref 13.0–17.0)
MCH: 31.7 pg (ref 26.0–34.0)
MCHC: 35.7 g/dL (ref 30.0–36.0)
MCV: 89 fL (ref 80.0–100.0)
Platelets: 115 10*3/uL — ABNORMAL LOW (ref 150–400)
RBC: 5.26 MIL/uL (ref 4.22–5.81)
RDW: 13.5 % (ref 11.5–15.5)
WBC: 3 10*3/uL — ABNORMAL LOW (ref 4.0–10.5)
nRBC: 0 % (ref 0.0–0.2)

## 2019-10-08 LAB — BASIC METABOLIC PANEL
Anion gap: 12 (ref 5–15)
BUN: 15 mg/dL (ref 6–20)
CO2: 23 mmol/L (ref 22–32)
Calcium: 8.4 mg/dL — ABNORMAL LOW (ref 8.9–10.3)
Chloride: 98 mmol/L (ref 98–111)
Creatinine, Ser: 0.95 mg/dL (ref 0.61–1.24)
GFR calc Af Amer: >60 mL/min (ref >60)
GFR calc non Af Amer: >60 mL/min (ref >60)
Glucose, Bld: 316 mg/dL — ABNORMAL HIGH (ref 70–99)
Potassium: 4.2 mmol/L (ref 3.5–5.1)
Sodium: 133 mmol/L — ABNORMAL LOW (ref 135–145)

## 2019-10-08 LAB — TROPONIN I (HIGH SENSITIVITY): Troponin I (High Sensitivity): 6 ng/L (ref <18)

## 2019-10-08 MED ORDER — GUAIFENESIN-CODEINE 100-10 MG/5ML PO SYRP: 5.0000 mL | ORAL_SOLUTION | Freq: Three times a day (TID) | ORAL | 0 refills | Status: DC | PRN

## 2019-10-08 MED ORDER — LIDOCAINE 5 % EX PTCH
1.0000 | MEDICATED_PATCH | CUTANEOUS | Status: DC
  Administered 2019-10-08: 14:00:00 1 via TRANSDERMAL
  Filled 2019-10-08: qty 1

## 2019-10-08 MED ORDER — ACETAMINOPHEN 500 MG PO TABS
1000.0000 mg | ORAL_TABLET | Freq: Once | ORAL | Status: AC
  Administered 2019-10-08: 14:00:00 1000 mg via ORAL
  Filled 2019-10-08: qty 2

## 2019-10-08 NOTE — ED Provider Notes (Signed)
[facility] Emergency Department Provider Note  ____________________________________________   First MD Initiated Contact with Patient 10/08/19 1313     (approximate)  I have reviewed the triage vital signs and the nursing notes.   HISTORY  Chief Complaint Chest Pain and COVID-19 Positive    HPI Steven Holmes is a 58 y.o. male with diabetes who presents with left rib cage pain.  Patient was positive for coronavirus on Monday 11/31.  States prior to this he had a little bit of diarrhea and was tested because all of his other family members were positive.  He states that since then he has been having a worsening cough.  He started developing worsening left-sided chest pain about 3 days ago.  It is constant, moderate, has not really been taking any over-the-counter to help with the pain, worse with coughing.  He states he had pneumonia previously he just wanted make sure that this was not anything that required antibiotics or other interventions.  He denies prior heart attacks, risk factors for PE.  He does endorse little bit of decreased p.o. intake.  He is diabetic and has been taking his medications.          Past Medical History:  Diagnosis Date  . Complex regional pain syndrome   . COVID-19   . Diabetes mellitus without complication ([place])    type 2  . Diverticulosis   . DJD (degenerative joint disease), lumbar   . Hypertension     Patient Active Problem List   Diagnosis Date Noted  . Hyperlipidemia associated with type 2 diabetes mellitus ([place]) 02/15/2015  . Preoperative evaluation of a medical condition to rule out surgical contraindications (TAR required) 05/13/2014  . Candidiasis, intertriginous 05/13/2014  . Degenerative disk disease 02/26/2014  . Lumbar spinal stenosis 02/03/2014  . DM type 2 (diabetes mellitus, type 2) ([place]) 05/22/2013  . Allergic rhinitis 12/25/2012  . Dyspnea 08/17/2012  . HTN (hypertension) 05/13/2012  . OSA  (obstructive sleep apnea) 05/13/2012  . Obesity (BMI 30-39.9) 05/13/2012  . Lower extremity venous stasis 05/13/2012  . Diverticulosis     Past Surgical History:  Procedure Laterality Date  . HERNIA REPAIR  2009   revision left sided inguinal at [facility]  . VASECTOMY      Prior to Admission medications   Medication Sig Start Date End Date Taking? Authorizing Provider  albuterol (PROVENTIL HFA;VENTOLIN HFA) 108 (90 Base) MCG/ACT inhaler Inhale 2 puffs into the lungs every 6 (six) hours as needed for wheezing or shortness of breath. 10/17/16   Menshew, Dannielle Karvonen, PA-C  ALFALFA PO Take 2 tablets by mouth 3 (three) times daily.    [provider]  atorvastatin (LIPITOR) 20 MG tablet Take 1 tablet (20 mg total) by mouth daily. 02/15/15   Crecencio Mc, MD  benzonatate (TESSALON PERLES) 100 MG capsule Take 1 capsule (100 mg total) by mouth 3 (three) times daily as needed for cough (Take 1-2 per dose). 10/17/16   Menshew, Dannielle Karvonen, PA-C  cyclobenzaprine (FLEXERIL) 5 MG tablet Take 1 tablet (5 mg total) by mouth 3 (three) times daily as needed for muscle spasms. 01/22/17   Menshew, Dannielle Karvonen, PA-C  fluticasone (FLONASE) 50 MCG/ACT nasal spray Place 2 sprays into both nostrils daily. 10/17/16   Menshew, Dannielle Karvonen, PA-C  furosemide (LASIX) 20 MG tablet Take 1 tablet (20 mg total) by mouth daily. As needed for fluid retention 02/13/15   Crecencio Mc, MD  glipiZIDE (GLUCOTROL) 5 MG tablet TAKE ONE TABLET BY MOUTH TWICE DAILY BEFORE A MEAL 09/13/14   Sherlene Shamsullo, Teresa L, MD  ketorolac (TORADOL) 10 MG tablet Take 1 tablet (10 mg total) by mouth every 8 (eight) hours. 01/22/17   Menshew, Charlesetta IvoryJenise V Bacon, PA-C  losartan (COZAAR) 25 MG tablet TAKE ONE TABLET BY MOUTH ONCE DAILY 03/09/15   Sherlene Shamsullo, Teresa L, MD  metFORMIN (GLUCOPHAGE) 500 MG tablet TAKE ONE TABLET BY MOUTH TWICE DAILY WITH MEALS 10/09/14   Sherlene Shamsullo, Teresa L, MD  methocarbamol (ROBAXIN-750) 750 MG tablet Take 1 tablet (750 mg  total) by mouth 4 (four) times daily. Patient not taking: Reported on 02/13/2015 09/06/13   Lorre NickAllen, Anthony, MD  Multiple Vitamin (MULTIVITAMIN) tablet Take 1 tablet by mouth daily.    [provider]  nystatin cream (MYCOSTATIN) Apply 1 application topically 2 (two) times daily. Until rash has resolved Patient not taking: Reported on 02/13/2015 05/10/14   Sherlene Shamsullo, Teresa L, MD  oxyCODONE-acetaminophen (PERCOCET/ROXICET) 5-325 MG per tablet Take 2 tablets by mouth every 4 (four) hours as needed for severe pain. Patient not taking: Reported on 02/13/2015 09/06/13   Lorre NickAllen, Anthony, MD  traMADol (ULTRAM) 50 MG tablet Take 1 tablet (50 mg total) by mouth every 8 (eight) hours as needed. 02/13/15   Sherlene Shamsullo, Teresa L, MD    Allergies Lisinopril  Family History  Problem Relation Age of Onset  . Cancer Mother        skin Ca  . Cancer Father        basal cell , nose     Social History Social History   Tobacco Use  . Smoking status: Former Smoker    Packs/day: 1.00    Years: 25.00    Pack years: 25.00    Types: Cigarettes    Quit date: 11/03/2000    Years since quitting: 18.9  . Smokeless tobacco: Never Used  Substance Use Topics  . Alcohol use: No  . Drug use: No      Review of Systems Constitutional: No fever/chills Eyes: No visual changes. ENT: No sore throat. Cardiovascular: Positive chest pain Respiratory: Denies shortness of breath. Gastrointestinal: No abdominal pain.  No nausea, no vomiting.  No diarrhea.  No constipation. Genitourinary: Negative for dysuria. Musculoskeletal: Negative for back pain. Skin: Negative for rash. Neurological: Negative for headaches, focal weakness or numbness. All other ROS negative ____________________________________________   PHYSICAL EXAM:  VITAL SIGNS: ED Triage Vitals [10/08/19 1235]  Enc Vitals Group     BP (!) 181/107     Pulse Rate (!) 108     Resp 18     Temp 99.4 F (37.4 C)     Temp Source Oral     SpO2 96 %      Weight 250 lb (113.4 kg)     Height 5\' 8"  (1.727 m)     Head Circumference      Peak Flow      Pain Score 7     Pain Loc      Pain Edu?      Excl. in GC?     Constitutional: Alert and oriented. Well appearing and in no acute distress. Eyes: Conjunctivae are normal. EOMI. Head: Atraumatic. Nose: No congestion/rhinnorhea. Mouth/Throat: Mucous membranes are moist.   Neck: No stridor. Trachea Midline. FROM Cardiovascular:tachycardiac, regular rhythm. Grossly normal heart sounds.  Good peripheral circulation.  Left chest wall pain. Respiratory: Normal respiratory effort.  No retractions.  Gastrointestinal: Soft and nontender. No distention. No  abdominal bruits.  Musculoskeletal: No lower extremity tenderness nor edema.  No joint effusions. Neurologic:  Normal speech and language. No gross focal neurologic deficits are appreciated.  Skin:  Skin is warm, dry and intact. No rash noted. Psychiatric: Mood and affect are normal. Speech and behavior are normal. GU: Deferred   ____________________________________________   LABS (all labs ordered are listed, but only abnormal results are displayed)  Labs Reviewed  BASIC METABOLIC PANEL - Abnormal; Notable for the following components:      Result Value   Sodium 133 (*)    Glucose, Bld 316 (*)    Calcium 8.4 (*)    All other components within normal limits  CBC - Abnormal; Notable for the following components:   WBC 3.0 (*)    Platelets 115 (*)    All other components within normal limits  TROPONIN I (HIGH SENSITIVITY)  TROPONIN I (HIGH SENSITIVITY)   ____________________________________________   ED ECG REPORT I, Concha Se, the attending physician, personally viewed and interpreted this ECG.  EKG is sinus tachycardia rate of 106, no ST elevation, no T wave inversions, normal intervals ____________________________________________  RADIOLOGY Vela Prose, personally viewed and evaluated these images (plain radiographs) as  part of my medical decision making, as well as reviewing the written report by the radiologist.  ED MD interpretation: No evidence of focal lobar pneumonia  Official radiology report(s): Dg Chest Port 1 View  Result Date: 10/08/2019 CLINICAL DATA:  Pneumonia, COVID-19 positive EXAM: PORTABLE CHEST 1 VIEW COMPARISON:  Portable exam 1325 hours compared to 03/29/2017 FINDINGS: Upper normal heart size. Mediastinal contours and pulmonary vascularity normal. Minimal bibasilar atelectasis and chronic accentuation of interstitial markings. Lungs otherwise clear. No segmental infiltrate, pleural effusion or pneumothorax. Bones unremarkable. IMPRESSION: Minimal bibasilar atelectasis. Electronically Signed   By: Ulyses Southward M.D.   On: 10/08/2019 13:53    ____________________________________________   PROCEDURES  Procedure(s) performed (including Critical Care):  Procedures   ____________________________________________   INITIAL IMPRESSION / ASSESSMENT AND PLAN / ED COURSE   Quinten Allerton was evaluated in Emergency Department on 10/08/2019 for the symptoms described in the history of present illness. He was evaluated in the context of the global COVID-19 pandemic, which necessitated consideration that the patient might be at risk for infection with the SARS-CoV-2 virus that causes COVID-19. Institutional protocols and algorithms that pertain to the evaluation of patients at risk for COVID-19 are in a state of rapid change based on information released by regulatory bodies including the CDC and federal and state organizations. These policies and algorithms were followed during the patient's care in the ED.    Most Likely DDx:  -MSK (atypical chest pain) from his coughing and known coronavirus.  DDx that was also considered d/t potential to cause harm, but was found less likely based on history and physical (as detailed above): -PNA (no fevers, cough but CXR to evaluate) -PNX (reassured  with equal b/l breath sounds, CXR to evaluate) -Symptomatic anemia (will get H&H) -Pulmonary embolism as no sob at rest, not pleuritic in nature, no hypoxia -Aortic Dissection as no tearing pain and no radiation to the mid back, pulses equal -Pericarditis no rub on exam, EKG changes or hx to suggest dx -Tamponade (no notable SOB, tachycardic, hypotensive) -Esophageal rupture (no h/o diffuse vomitting/no crepitus)  White count is low at 3 which is most likely secondary to his coronavirus.  He is platelets are also 115 which has been low in the past.  Is  no bleeding.  He is well-appearing I do not think he is septic or bacteremic.  His electrolytes are otherwise reassuring separate slightly low sodium and elevated glucose but no anion gap to suggest DKA.  Patient is aware of his elevated glucose and he will follow-up with this.  His troponin was 6.  Given chest pain has been greater than 3 days this rules him out.  Patient's ambulatory saturation was greater than 90 he did not look extremely short of breath.  Will provide patient cough syrup to help with some of the pain and coughing and have patient follow-up for worsening shortness of breath.  I discussed the provisional nature of ED diagnosis, the treatment so far, the ongoing plan of care, follow up appointments and return precautions with the patient and any family or support people present. They expressed understanding and agreed with the plan, discharged home.  ____________________________________________   FINAL CLINICAL IMPRESSION(S) / ED DIAGNOSES   Final diagnoses:  COVID-19  Chest wall pain     MEDICATIONS GIVEN DURING THIS VISIT:  Medications  lidocaine (LIDODERM) 5 % 1 patch (1 patch Transdermal Patch Applied 10/08/19 1336)  acetaminophen (TYLENOL) tablet 1,000 mg (1,000 mg Oral Given 10/08/19 1336)     ED Discharge Orders         Ordered    guaiFENesin-codeine (ROBITUSSIN AC) 100-10 MG/5ML syrup  3 times daily PRN      10/08/19 1531           Note:  This document was prepared using Dragon voice recognition software and may include unintentional dictation errors.   Concha Se, MD 10/08/19 [phone]

## 2019-10-08 NOTE — ED Notes (Signed)
EDP at bedside  

## 2019-10-08 NOTE — ED Notes (Signed)
Per EDP do not draw second troponin.  °

## 2019-10-08 NOTE — ED Notes (Signed)
X-ray at bedside

## 2019-10-08 NOTE — Discharge Instructions (Addendum)
Your cough and chest pain is most likely from the coronavirus.  Your chest x-ray did not show any fluid buildup or any other concerns.  Your sugar was elevated and see up to follow this up after your virus is healed to ensure that is going back down.  Your heart rate was also a little elevated so district plenty of fluid.  I prescribed some cough syrup to use for symptomatic management.  This does have an opioid in it so do not drive while on this.  Start off using at nighttime.  Take 1 g of Tylenol every 8 hours.  Return to the ER for worsening shortness of breath.      Person Under Monitoring Name: Steven Holmes  Location: [address] [place] [address]   Infection Prevention Recommendations for Individuals Confirmed to have, or Being Evaluated for, 2019 Novel Coronavirus (COVID-19) Infection Who Receive Care at Home  Individuals who are confirmed to have, or are being evaluated for, COVID-19 should follow the prevention steps below until a healthcare provider or local or state health department says they can return to normal activities.  Stay home except to get medical care You should restrict activities outside your home, except for getting medical care. Do not go to work, school, or public areas, and do not use public transportation or taxis.  Call ahead before visiting your doctor Before your medical appointment, call the healthcare provider and tell them that you have, or are being evaluated for, COVID-19 infection. This will help the healthcare providers office take steps to keep other people from getting infected. Ask your healthcare provider to call the local or state health department.  Monitor your symptoms Seek prompt medical attention if your illness is worsening (e.g., difficulty breathing). Before going to your medical appointment, call the healthcare provider and tell them that you have, or are being evaluated for, COVID-19 infection. Ask your healthcare  provider to call the local or state health department.  Wear a facemask You should wear a facemask that covers your nose and mouth when you are in the same room with other people and when you visit a healthcare provider. People who live with or visit you should also wear a facemask while they are in the same room with you.  Separate yourself from other people in your home As much as possible, you should stay in a different room from other people in your home. Also, you should use a separate bathroom, if available.  Avoid sharing household items You should not share dishes, drinking glasses, cups, eating utensils, towels, bedding, or other items with other people in your home. After using these items, you should wash them thoroughly with soap and water.  Cover your coughs and sneezes Cover your mouth and nose with a tissue when you cough or sneeze, or you can cough or sneeze into your sleeve. Throw used tissues in a lined trash can, and immediately wash your hands with soap and water for at least 20 seconds or use an alcohol-based hand rub.  Wash your [employer] your hands often and thoroughly with soap and water for at least 20 seconds. You can use an alcohol-based hand sanitizer if soap and water are not available and if your hands are not visibly dirty. Avoid touching your eyes, nose, and mouth with unwashed hands.   Prevention Steps for Caregivers and Household Members of Individuals Confirmed to have, or Being Evaluated for, COVID-19 Infection Being Cared for in the Home  If you live with, or provide care at home for, a person confirmed to have, or being evaluated for, COVID-19 infection please follow these guidelines to prevent infection:  Follow healthcare providers instructions Make sure that you understand and can help the patient follow any healthcare provider instructions for all care.  Provide for the patients basic needs You should help the patient with basic needs  in the home and provide support for getting groceries, prescriptions, and other personal needs.  Monitor the patients symptoms If they are getting sicker, call his or her medical provider and tell them that the patient has, or is being evaluated for, COVID-19 infection. This will help the healthcare providers office take steps to keep other people from getting infected. Ask the healthcare provider to call the local or state health department.  Limit the number of people who have contact with the patient If possible, have only one caregiver for the patient. Other household members should stay in another home or place of residence. If this is not possible, they should stay in another room, or be separated from the patient as much as possible. Use a separate bathroom, if available. Restrict visitors who do not have an essential need to be in the home.  Keep older adults, very young children, and other sick people away from the patient Keep older adults, very young children, and those who have compromised immune systems or chronic health conditions away from the patient. This includes people with chronic heart, lung, or kidney conditions, diabetes, and cancer.  Ensure good ventilation Make sure that shared spaces in the home have good air flow, such as from an air conditioner or an opened window, weather permitting.  Wash your hands often Wash your hands often and thoroughly with soap and water for at least 20 seconds. You can use an alcohol based hand sanitizer if soap and water are not available and if your hands are not visibly dirty. Avoid touching your eyes, nose, and mouth with unwashed hands. Use disposable paper towels to dry your hands. If not available, use dedicated cloth towels and replace them when they become wet.  Wear a facemask and gloves Wear a disposable facemask at all times in the room and gloves when you touch or have contact with the patients blood, body fluids,  and/or secretions or excretions, such as sweat, saliva, sputum, nasal mucus, vomit, urine, or feces.  Ensure the mask fits over your nose and mouth tightly, and do not touch it during use. Throw out disposable facemasks and gloves after using them. Do not reuse. Wash your hands immediately after removing your facemask and gloves. If your personal clothing becomes contaminated, carefully remove clothing and launder. Wash your hands after handling contaminated clothing. Place all used disposable facemasks, gloves, and other waste in a lined container before disposing them with other household waste. Remove gloves and wash your hands immediately after handling these items.  Do not share dishes, glasses, or other household items with the patient Avoid sharing household items. You should not share dishes, drinking glasses, cups, eating utensils, towels, bedding, or other items with a patient who is confirmed to have, or being evaluated for, COVID-19 infection. After the person uses these items, you should wash them thoroughly with soap and water.  Wash laundry thoroughly Immediately remove and wash clothes or bedding that have blood, body fluids, and/or secretions or excretions, such as sweat, saliva, sputum, nasal mucus, vomit, urine, or feces, on them. Wear gloves when handling laundry from  the patient. Read and follow directions on labels of laundry or clothing items and detergent. In general, wash and dry with the warmest temperatures recommended on the label.  Clean all areas the individual has used often Clean all touchable surfaces, such as counters, tabletops, doorknobs, bathroom fixtures, toilets, phones, keyboards, tablets, and bedside tables, every day. Also, clean any surfaces that may have blood, body fluids, and/or secretions or excretions on them. Wear gloves when cleaning surfaces the patient has come in contact with. Use a diluted bleach solution (e.g., dilute bleach with 1 part bleach  and 10 parts water) or a household disinfectant with a label that says EPA-registered for coronaviruses. To make a bleach solution at home, add 1 tablespoon of bleach to 1 quart (4 cups) of water. For a larger supply, add  cup of bleach to 1 gallon (16 cups) of water. Read labels of cleaning products and follow recommendations provided on product labels. Labels contain instructions for safe and effective use of the cleaning product including precautions you should take when applying the product, such as wearing gloves or eye protection and making sure you have good ventilation during use of the product. Remove gloves and wash hands immediately after cleaning.  Monitor yourself for signs and symptoms of illness Caregivers and household members are considered close contacts, should monitor their health, and will be asked to limit movement outside of the home to the extent possible. Follow the monitoring steps for close contacts listed on the symptom monitoring form.   ? If you have additional questions, contact your local health department or call the epidemiologist on call at [phone] (available 24/7). ? This guidance is subject to change. For the most up-to-date guidance from [facility], please refer to their website: TripMetro.huhttps://www.cdc.gov/coronavirus/2019-ncov/hcp/guidance-prevent-spread.html

## 2019-10-08 NOTE — ED Notes (Signed)
Xray leaving pt room at this time.

## 2019-10-08 NOTE — ED Triage Notes (Signed)
Pt arrived via POV with reports of left rib cage pain states not sure if he lung is filling with fluid.  Pt reports he has hx of PNA and feels similar.  Pt COVID positive, tested positive Tuesday.  Cough is productive at times.

## 2019-10-08 NOTE — ED Notes (Signed)
Pt A&O, ambulatory to treatment room. No distress noted.

## 2019-10-08 NOTE — ED Notes (Signed)
Pt ambulated with continuous o2 monitoring. Pt sating 91-93%. Pt educated on deep breathing techniques. Pt demonstrated understanding- O2 sats 97% with deep breathing. MD Jari Pigg notified.

## 2019-10-14 ENCOUNTER — Emergency Department: Payer: Medicare PPO

## 2019-10-14 ENCOUNTER — Inpatient Hospital Stay
Admission: EM | Admit: 2019-10-14 | Discharge: 2019-10-15 | DRG: 177 | Disposition: A | Discharge status: Other Acute Inpatient Hospital | Payer: Medicare PPO | Attending: Internal Medicine | Admitting: Internal Medicine

## 2019-10-14 ENCOUNTER — Other Ambulatory Visit: Payer: Self-pay

## 2019-10-14 DIAGNOSIS — R0602 Shortness of breath: Secondary | ICD-10-CM

## 2019-10-14 DIAGNOSIS — J1289 Other viral pneumonia: Secondary | ICD-10-CM | POA: Diagnosis present

## 2019-10-14 DIAGNOSIS — U071 COVID-19: Secondary | ICD-10-CM | POA: Diagnosis present

## 2019-10-14 DIAGNOSIS — Z87891 Personal history of nicotine dependence: Secondary | ICD-10-CM | POA: Diagnosis not present

## 2019-10-14 DIAGNOSIS — Z7984 Long term (current) use of oral hypoglycemic drugs: Secondary | ICD-10-CM | POA: Diagnosis not present

## 2019-10-14 DIAGNOSIS — I10 Essential (primary) hypertension: Secondary | ICD-10-CM | POA: Diagnosis present

## 2019-10-14 DIAGNOSIS — J9600 Acute respiratory failure, unspecified whether with hypoxia or hypercapnia: Secondary | ICD-10-CM | POA: Diagnosis present

## 2019-10-14 DIAGNOSIS — J069 Acute upper respiratory infection, unspecified: Secondary | ICD-10-CM

## 2019-10-14 DIAGNOSIS — J189 Pneumonia, unspecified organism: Secondary | ICD-10-CM

## 2019-10-14 DIAGNOSIS — R0902 Hypoxemia: Secondary | ICD-10-CM

## 2019-10-14 DIAGNOSIS — G4733 Obstructive sleep apnea (adult) (pediatric): Secondary | ICD-10-CM | POA: Diagnosis present

## 2019-10-14 DIAGNOSIS — J9601 Acute respiratory failure with hypoxia: Secondary | ICD-10-CM | POA: Diagnosis present

## 2019-10-14 DIAGNOSIS — Z79899 Other long term (current) drug therapy: Secondary | ICD-10-CM

## 2019-10-14 DIAGNOSIS — E1165 Type 2 diabetes mellitus with hyperglycemia: Secondary | ICD-10-CM | POA: Diagnosis present

## 2019-10-14 LAB — CBC WITH DIFFERENTIAL/PLATELET
Abs Immature Granulocytes: 0.04 10*3/uL (ref 0.00–0.07)
Basophils Absolute: 0 10*3/uL (ref 0.0–0.1)
Basophils Relative: 0 %
Eosinophils Absolute: 0 10*3/uL (ref 0.0–0.5)
Eosinophils Relative: 0 %
HCT: 43.6 % (ref 39.0–52.0)
Hemoglobin: 15.2 g/dL (ref 13.0–17.0)
Immature Granulocytes: 1 %
Lymphocytes Relative: 15 %
Lymphs Abs: 0.8 10*3/uL (ref 0.7–4.0)
MCH: 31.6 pg (ref 26.0–34.0)
MCHC: 34.9 g/dL (ref 30.0–36.0)
MCV: 90.6 fL (ref 80.0–100.0)
Monocytes Absolute: 0.3 10*3/uL (ref 0.1–1.0)
Monocytes Relative: 5 %
Neutro Abs: 4.4 10*3/uL (ref 1.7–7.7)
Neutrophils Relative %: 79 %
Platelets: 143 10*3/uL — ABNORMAL LOW (ref 150–400)
RBC: 4.81 MIL/uL (ref 4.22–5.81)
RDW: 13.7 % (ref 11.5–15.5)
Smear Review: ADEQUATE
WBC: 5.5 10*3/uL (ref 4.0–10.5)
nRBC: 0 % (ref 0.0–0.2)

## 2019-10-14 LAB — COMPREHENSIVE METABOLIC PANEL
ALT: 32 U/L (ref 0–44)
AST: 33 U/L (ref 15–41)
Albumin: 3.3 g/dL — ABNORMAL LOW (ref 3.5–5.0)
Alkaline Phosphatase: 67 U/L (ref 38–126)
Anion gap: 12 (ref 5–15)
BUN: 19 mg/dL (ref 6–20)
CO2: 24 mmol/L (ref 22–32)
Calcium: 8.1 mg/dL — ABNORMAL LOW (ref 8.9–10.3)
Chloride: 100 mmol/L (ref 98–111)
Creatinine, Ser: 0.94 mg/dL (ref 0.61–1.24)
GFR calc Af Amer: >60 mL/min (ref >60)
GFR calc non Af Amer: >60 mL/min (ref >60)
Glucose, Bld: 329 mg/dL — ABNORMAL HIGH (ref 70–99)
Potassium: 3.9 mmol/L (ref 3.5–5.1)
Sodium: 136 mmol/L (ref 135–145)
Total Bilirubin: 1.2 mg/dL (ref 0.3–1.2)
Total Protein: 7.3 g/dL (ref 6.5–8.1)

## 2019-10-14 LAB — LACTATE DEHYDROGENASE: LDH: 365 U/L — ABNORMAL HIGH (ref 98–192)

## 2019-10-14 LAB — PROCALCITONIN: Procalcitonin: <0.1 ng/mL

## 2019-10-14 LAB — FERRITIN: Ferritin: 811 ng/mL — ABNORMAL HIGH (ref 24–336)

## 2019-10-14 LAB — FIBRIN DERIVATIVES D-DIMER (ARMC ONLY): Fibrin derivatives D-dimer (ARMC): 1154.84 ng/mL (FEU) — ABNORMAL HIGH (ref 0.00–499.00)

## 2019-10-14 LAB — LACTIC ACID, PLASMA: Lactic Acid, Venous: 1.6 mmol/L (ref 0.5–1.9)

## 2019-10-14 MED ORDER — INSULIN REGULAR(HUMAN) IN NACL 100-0.9 UT/100ML-% IV SOLN
INTRAVENOUS | Status: DC
  Administered 2019-10-15: 07:00:00 24 [IU]/h via INTRAVENOUS
  Filled 2019-10-14: qty 100

## 2019-10-14 MED ORDER — GUAIFENESIN-DM 100-10 MG/5ML PO SYRP
10.0000 mL | ORAL_SOLUTION | ORAL | Status: DC | PRN
  Filled 2019-10-14: qty 10

## 2019-10-14 MED ORDER — DEXTROSE 50 % IV SOLN: 0.0000 mL | INTRAVENOUS | Status: DC | PRN

## 2019-10-14 MED ORDER — METHYLPREDNISOLONE SODIUM SUCC 125 MG IJ SOLR
60.0000 mg | Freq: Two times a day (BID) | INTRAMUSCULAR | Status: DC
  Administered 2019-10-15: 60 mg via INTRAVENOUS
  Filled 2019-10-14: qty 2

## 2019-10-14 MED ORDER — DEXAMETHASONE SODIUM PHOSPHATE 10 MG/ML IJ SOLN
6.0000 mg | Freq: Once | INTRAMUSCULAR | Status: AC
  Administered 2019-10-14: 6 mg via INTRAVENOUS
  Filled 2019-10-14: qty 1

## 2019-10-14 MED ORDER — VITAMIN C 500 MG PO TABS
500.0000 mg | ORAL_TABLET | Freq: Every day | ORAL | Status: DC
  Filled 2019-10-14: qty 1

## 2019-10-14 MED ORDER — ZINC SULFATE 220 (50 ZN) MG PO CAPS
220.0000 mg | ORAL_CAPSULE | Freq: Every day | ORAL | Status: DC
  Filled 2019-10-14: qty 1

## 2019-10-14 MED ORDER — SODIUM CHLORIDE 0.9 % IV SOLN
INTRAVENOUS | Status: DC
  Administered 2019-10-15: 01:00:00 via INTRAVENOUS

## 2019-10-14 MED ORDER — ALBUTEROL SULFATE (2.5 MG/3ML) 0.083% IN NEBU: 2.5000 mg | INHALATION_SOLUTION | Freq: Four times a day (QID) | RESPIRATORY_TRACT | Status: DC

## 2019-10-14 MED ORDER — ATORVASTATIN CALCIUM 20 MG PO TABS: 20.0000 mg | ORAL_TABLET | Freq: Every day | ORAL | Status: DC

## 2019-10-14 MED ORDER — SODIUM CHLORIDE 0.9 % IV SOLN
200.0000 mg | Freq: Once | INTRAVENOUS | Status: AC
  Administered 2019-10-15: 200 mg via INTRAVENOUS
  Filled 2019-10-14: qty 200

## 2019-10-14 MED ORDER — ADULT MULTIVITAMIN W/MINERALS CH: 1.0000 | ORAL_TABLET | Freq: Every day | ORAL | Status: DC

## 2019-10-14 MED ORDER — ENOXAPARIN SODIUM 40 MG/0.4ML ~~LOC~~ SOLN
40.0000 mg | SUBCUTANEOUS | Status: DC
  Administered 2019-10-15: 40 mg via SUBCUTANEOUS
  Filled 2019-10-14: qty 0.4

## 2019-10-14 MED ORDER — SODIUM CHLORIDE 0.9 % IV SOLN
100.0000 mg | Freq: Every day | INTRAVENOUS | Status: DC
  Filled 2019-10-14: qty 20

## 2019-10-14 MED ORDER — DEXTROSE-NACL 5-0.45 % IV SOLN: INTRAVENOUS | Status: DC

## 2019-10-14 NOTE — ED Triage Notes (Signed)
Pt reports productive cough with orange sputum and SOB r/t covid dx on Nov 30th.

## 2019-10-14 NOTE — ED Notes (Signed)
2L [place] at 80% good wave form, oxygen increased to 4L [place] and oxygen saturation increased to 84%, increased to 6L [place] and remained at 84%

## 2019-10-14 NOTE — ED Notes (Addendum)
Pt o2 sats on 84-86% on RA, verbal order given by Theadora Rama RN to put pt on 2L [place], pt sats back up to 90%, [employer] notified

## 2019-10-14 NOTE — ED Notes (Signed)
Pt placed on 2L of O2. Sats now 91%.

## 2019-10-14 NOTE — ED Provider Notes (Signed)
[facility] Emergency Department Provider Note  ____________________________________________   First MD Initiated Contact with Patient 10/14/19 1948     (approximate)  I have reviewed the triage vital signs and the nursing notes.  History  Chief Complaint Shortness of Breath    HPI Steven Holmes is a 57 y.o. male with history of HTN, DM, known COVID positive, diagnosed on 10/03/2019, who presents to the emergency department for worsening shortness of breath.  Noted to be hypoxic on arrival 84 to 86% on room air, now requiring supplemental oxygen.  He does not normally require supplemental oxygen.  Oxygen 90% on NRB.  He reports shortness of breath for the last several weeks, significantly worsening over the last several days.  Especially SOB with exertion.  No fevers.  Intermittent nausea, loose stools.    Past Medical Hx Past Medical History:  Diagnosis Date  . Complex regional pain syndrome   . COVID-19   . Diabetes mellitus without complication (HCC)    type 2  . Diverticulosis   . DJD (degenerative joint disease), lumbar   . Hypertension     Problem List Patient Active Problem List   Diagnosis Date Noted  . Hyperlipidemia associated with type 2 diabetes mellitus (HCC) 02/15/2015  . Preoperative evaluation of a medical condition to rule out surgical contraindications (TAR required) 05/13/2014  . Candidiasis, intertriginous 05/13/2014  . Degenerative disk disease 02/26/2014  . Lumbar spinal stenosis 02/03/2014  . DM type 2 (diabetes mellitus, type 2) (HCC) 05/22/2013  . Allergic rhinitis 12/25/2012  . Dyspnea 08/17/2012  . HTN (hypertension) 05/13/2012  . OSA (obstructive sleep apnea) 05/13/2012  . Obesity (BMI 30-39.9) 05/13/2012  . Lower extremity venous stasis 05/13/2012  . Diverticulosis     Past Surgical Hx Past Surgical History:  Procedure Laterality Date  . HERNIA REPAIR  2009   revision left sided inguinal at [facility]  .  VASECTOMY      Medications Prior to Admission medications   Medication Sig Start Date End Date Taking? Authorizing Provider  albuterol (PROVENTIL HFA;VENTOLIN HFA) 108 (90 Base) MCG/ACT inhaler Inhale 2 puffs into the lungs every 6 (six) hours as needed for wheezing or shortness of breath. 10/17/16   Menshew, Charlesetta Ivory, PA-C  ALFALFA PO Take 2 tablets by mouth 3 (three) times daily.    [provider]  atorvastatin (LIPITOR) 20 MG tablet Take 1 tablet (20 mg total) by mouth daily. 02/15/15   Sherlene Shams, MD  benzonatate (TESSALON PERLES) 100 MG capsule Take 1 capsule (100 mg total) by mouth 3 (three) times daily as needed for cough (Take 1-2 per dose). 10/17/16   Menshew, Charlesetta Ivory, PA-C  cyclobenzaprine (FLEXERIL) 5 MG tablet Take 1 tablet (5 mg total) by mouth 3 (three) times daily as needed for muscle spasms. 01/22/17   Menshew, Charlesetta Ivory, PA-C  fluticasone (FLONASE) 50 MCG/ACT nasal spray Place 2 sprays into both nostrils daily. 10/17/16   Menshew, Charlesetta Ivory, PA-C  furosemide (LASIX) 20 MG tablet Take 1 tablet (20 mg total) by mouth daily. As needed for fluid retention 02/13/15   Sherlene Shams, MD  glipiZIDE (GLUCOTROL) 5 MG tablet TAKE ONE TABLET BY MOUTH TWICE DAILY BEFORE A MEAL 09/13/14   Sherlene Shams, MD  guaiFENesin-codeine (ROBITUSSIN AC) 100-10 MG/5ML syrup Take 5 mLs by mouth 3 (three) times daily as needed for up to 8 days for cough. 10/08/19 10/16/19  Concha Se, MD  ketorolac (TORADOL)  10 MG tablet Take 1 tablet (10 mg total) by mouth every 8 (eight) hours. 01/22/17   Menshew, Charlesetta IvoryJenise V Bacon, PA-C  losartan (COZAAR) 25 MG tablet TAKE ONE TABLET BY MOUTH ONCE DAILY 03/09/15   Sherlene Shamsullo, Teresa L, MD  metFORMIN (GLUCOPHAGE) 500 MG tablet TAKE ONE TABLET BY MOUTH TWICE DAILY WITH MEALS 10/09/14   Sherlene Shamsullo, Teresa L, MD  methocarbamol (ROBAXIN-750) 750 MG tablet Take 1 tablet (750 mg total) by mouth 4 (four) times daily. Patient not taking: Reported on  02/13/2015 09/06/13   Lorre NickAllen, Anthony, MD  Multiple Vitamin (MULTIVITAMIN) tablet Take 1 tablet by mouth daily.    [provider]  nystatin cream (MYCOSTATIN) Apply 1 application topically 2 (two) times daily. Until rash has resolved Patient not taking: Reported on 02/13/2015 05/10/14   Sherlene Shamsullo, Teresa L, MD  oxyCODONE-acetaminophen (PERCOCET/ROXICET) 5-325 MG per tablet Take 2 tablets by mouth every 4 (four) hours as needed for severe pain. Patient not taking: Reported on 02/13/2015 09/06/13   Lorre NickAllen, Anthony, MD  traMADol (ULTRAM) 50 MG tablet Take 1 tablet (50 mg total) by mouth every 8 (eight) hours as needed. 02/13/15   Sherlene Shamsullo, Teresa L, MD    Allergies Lisinopril  Family Hx Family History  Problem Relation Age of Onset  . Cancer Mother        skin Ca  . Cancer Father        basal cell , nose     Social Hx Social History   Tobacco Use  . Smoking status: Former Smoker    Packs/day: 1.00    Years: 25.00    Pack years: 25.00    Types: Cigarettes    Quit date: 11/03/2000    Years since quitting: 18.9  . Smokeless tobacco: Never Used  Substance Use Topics  . Alcohol use: No  . Drug use: No     Review of Systems  Constitutional: Negative for fever, chills. Eyes: Negative for visual changes. ENT: Negative for sore throat. Cardiovascular: Negative for chest pain. Respiratory: + for shortness of breath. Gastrointestinal: Negative for nausea, vomiting.  Genitourinary: Negative for dysuria. Musculoskeletal: Negative for leg swelling. Skin: Negative for rash. Neurological: Negative for for headaches.   Physical Exam  Vital Signs: ED Triage Vitals [10/14/19 1621]  Enc Vitals Group     BP 127/88     Pulse Rate (!) 101     Resp 18     Temp 98.6 F (37 C)     Temp src      SpO2 (!) 86 %     Weight 250 lb (113.4 kg)     Height 5\' 8"  (1.727 m)     Head Circumference      Peak Flow      Pain Score 0     Pain Loc      Pain Edu?      Excl. in GC?      Constitutional: Alert and oriented.  Head: Normocephalic. Atraumatic. Eyes: Conjunctivae clear. Sclera anicteric. Nose: No congestion. No rhinorrhea. Mouth/Throat: Wearing mask.  Neck: No stridor.   Cardiovascular: HR low 100s. Extremities well perfused. Respiratory: Tachypneic low 20s.  Able to speak in full sentences.  Hypoxic on nasal cannula, 6 L, to mid 80s.  On NRB oxygen 90%. Gastrointestinal: Soft. Non-tender. Non-distended.  Musculoskeletal: No lower extremity edema. No deformities. Neurologic:  Normal speech and language. No gross focal neurologic deficits are appreciated.  Skin: Skin is warm, dry and intact. No rash noted. Psychiatric: Mood and  affect are appropriate for situation.  EKG  Personally reviewed.   Rate: 96 Rhythm: sinus Axis: normal Intervals: WNL No acute ischemic changes No STEMI    Radiology  XR: IMPRESSION: Interval development of diffuse patchy airspace opacities throughout both lungs, slightly more confluent within the lung bases, suspicious for multifocal atypical/viral pneumonia   Procedures  Procedure(s) performed (including critical care):  .Critical Care Performed by: Lilia Pro., MD Authorized by: Lilia Pro., MD   Critical care provider statement:    Critical care time (minutes):  45   Critical care was necessary to treat or prevent imminent or life-threatening deterioration of the following conditions:  Respiratory failure   Critical care was time spent personally by me on the following activities:  Discussions with consultants, evaluation of patient's response to treatment, examination of patient, ordering and performing treatments and interventions, ordering and review of laboratory studies, ordering and review of radiographic studies, pulse oximetry, re-evaluation of patient's condition, obtaining history from patient or surrogate and review of old charts     Initial Impression / Assessment and Plan / ED  Course  58 y.o. male who presents to the ED for worsening SOB, hypoxia, known COVID positive.   Hypoxic to the 80s on [place]. Placed on NRB with oxygen 90%. Will place on HFNC.  XR consistent with COVID pneumonia, negative procalcitonin supports viral PNA and less likely superimposed bacterial infection.  Will give steroids, initiate antiviral per pharmacy consult.  Will require admission.  Patient on HFNC, 100% FiO2, 50 L, satting 92-93% with improved work of breathing. inflammatory markers elevated, c/w COVID. Will admit.    Final Clinical Impression(s) / ED Diagnosis  Final diagnoses:  COVID-19  Hypoxia  SOB (shortness of breath)       Note:  This document was prepared using Dragon voice recognition software and may include unintentional dictation errors.   Lilia Pro., MD 10/14/19 2236

## 2019-10-14 NOTE — Progress Notes (Signed)
Remdesivir - Pharmacy Brief Note   O:  ALT:  32 CXR: evidence of infection on CXR  SpO2: 93 % on 2 L O2    A/P:  Remdesivir 200 mg IVPB once followed by 100 mg IVPB daily x 4 days.   .me 10/14/2019 10:13 PM

## 2019-10-14 NOTE — ED Notes (Signed)
Pt placed on 15L non rebreather at this time.

## 2019-10-14 NOTE — ED Notes (Signed)
Rip Harbour [phone]

## 2019-10-15 ENCOUNTER — Encounter (HOSPITAL_COMMUNITY): Payer: Self-pay | Admitting: Internal Medicine

## 2019-10-15 ENCOUNTER — Other Ambulatory Visit: Payer: Self-pay

## 2019-10-15 ENCOUNTER — Inpatient Hospital Stay (HOSPITAL_COMMUNITY): Payer: Medicare PPO

## 2019-10-15 ENCOUNTER — Inpatient Hospital Stay (HOSPITAL_COMMUNITY)
Admission: AD | Admit: 2019-10-15 | Discharge: 2019-10-23 | DRG: 177 | Disposition: A | Discharge status: Home / Self Care | Payer: Medicare PPO | Source: Other Acute Inpatient Hospital | Attending: Internal Medicine | Admitting: Internal Medicine

## 2019-10-15 DIAGNOSIS — E1165 Type 2 diabetes mellitus with hyperglycemia: Secondary | ICD-10-CM | POA: Diagnosis present

## 2019-10-15 DIAGNOSIS — N2889 Other specified disorders of kidney and ureter: Secondary | ICD-10-CM

## 2019-10-15 DIAGNOSIS — Z888 Allergy status to other drugs, medicaments and biological substances status: Secondary | ICD-10-CM

## 2019-10-15 DIAGNOSIS — Z7984 Long term (current) use of oral hypoglycemic drugs: Secondary | ICD-10-CM | POA: Diagnosis not present

## 2019-10-15 DIAGNOSIS — E662 Morbid (severe) obesity with alveolar hypoventilation: Secondary | ICD-10-CM | POA: Diagnosis present

## 2019-10-15 DIAGNOSIS — IMO0002 Reserved for concepts with insufficient information to code with codable children: Secondary | ICD-10-CM | POA: Diagnosis present

## 2019-10-15 DIAGNOSIS — J9601 Acute respiratory failure with hypoxia: Secondary | ICD-10-CM | POA: Diagnosis present

## 2019-10-15 DIAGNOSIS — Z6841 Body Mass Index (BMI) 40.0 and over, adult: Secondary | ICD-10-CM

## 2019-10-15 DIAGNOSIS — U071 COVID-19: Principal | ICD-10-CM | POA: Diagnosis present

## 2019-10-15 DIAGNOSIS — Z87891 Personal history of nicotine dependence: Secondary | ICD-10-CM | POA: Diagnosis not present

## 2019-10-15 DIAGNOSIS — E1169 Type 2 diabetes mellitus with other specified complication: Secondary | ICD-10-CM | POA: Diagnosis not present

## 2019-10-15 DIAGNOSIS — E118 Type 2 diabetes mellitus with unspecified complications: Secondary | ICD-10-CM | POA: Diagnosis present

## 2019-10-15 DIAGNOSIS — T380X5A Adverse effect of glucocorticoids and synthetic analogues, initial encounter: Secondary | ICD-10-CM | POA: Diagnosis not present

## 2019-10-15 DIAGNOSIS — G9050 Complex regional pain syndrome I, unspecified: Secondary | ICD-10-CM | POA: Diagnosis present

## 2019-10-15 DIAGNOSIS — E1100 Type 2 diabetes mellitus with hyperosmolarity without nonketotic hyperglycemic-hyperosmolar coma (NKHHC): Secondary | ICD-10-CM | POA: Diagnosis not present

## 2019-10-15 DIAGNOSIS — Z808 Family history of malignant neoplasm of other organs or systems: Secondary | ICD-10-CM

## 2019-10-15 DIAGNOSIS — I151 Hypertension secondary to other renal disorders: Secondary | ICD-10-CM | POA: Diagnosis not present

## 2019-10-15 DIAGNOSIS — G4733 Obstructive sleep apnea (adult) (pediatric): Secondary | ICD-10-CM | POA: Diagnosis present

## 2019-10-15 DIAGNOSIS — E785 Hyperlipidemia, unspecified: Secondary | ICD-10-CM | POA: Diagnosis present

## 2019-10-15 DIAGNOSIS — Z79899 Other long term (current) drug therapy: Secondary | ICD-10-CM

## 2019-10-15 DIAGNOSIS — I10 Essential (primary) hypertension: Secondary | ICD-10-CM | POA: Diagnosis present

## 2019-10-15 DIAGNOSIS — Z9981 Dependence on supplemental oxygen: Secondary | ICD-10-CM

## 2019-10-15 DIAGNOSIS — J1289 Other viral pneumonia: Secondary | ICD-10-CM

## 2019-10-15 DIAGNOSIS — R0602 Shortness of breath: Secondary | ICD-10-CM

## 2019-10-15 DIAGNOSIS — J1282 Pneumonia due to coronavirus disease 2019: Secondary | ICD-10-CM

## 2019-10-15 LAB — TROPONIN I (HIGH SENSITIVITY)
Troponin I (High Sensitivity): 4 ng/L (ref <18)
Troponin I (High Sensitivity): 4 ng/L (ref <18)

## 2019-10-15 LAB — BLOOD GAS, ARTERIAL
Acid-Base Excess: 2.7 mmol/L — ABNORMAL HIGH (ref 0.0–2.0)
Bicarbonate: 26.2 mmol/L (ref 20.0–28.0)
FIO2: 0.95
O2 Saturation: 95.3 %
Patient temperature: 37
pCO2 arterial: 36 mmHg (ref 32.0–48.0)
pH, Arterial: 7.47 — ABNORMAL HIGH (ref 7.350–7.450)
pO2, Arterial: 72 mmHg — ABNORMAL LOW (ref 83.0–108.0)

## 2019-10-15 LAB — MAGNESIUM
Magnesium: 2.5 mg/dL — ABNORMAL HIGH (ref 1.7–2.4)
Magnesium: 2.3 mg/dL (ref 1.7–2.4)

## 2019-10-15 LAB — PROCALCITONIN: Procalcitonin: <0.1 ng/mL

## 2019-10-15 LAB — RESPIRATORY PANEL BY PCR
Adenovirus: NOT DETECTED
Adenovirus: NOT DETECTED
Bordetella pertussis: NOT DETECTED
Bordetella pertussis: NOT DETECTED
Chlamydophila pneumoniae: NOT DETECTED
Chlamydophila pneumoniae: NOT DETECTED
Coronavirus 229E: NOT DETECTED
Coronavirus 229E: NOT DETECTED
Coronavirus HKU1: NOT DETECTED
Coronavirus HKU1: NOT DETECTED
Coronavirus NL63: NOT DETECTED
Coronavirus NL63: NOT DETECTED
Coronavirus OC43: NOT DETECTED
Coronavirus OC43: NOT DETECTED
Influenza A: NOT DETECTED
Influenza A: NOT DETECTED
Influenza B: NOT DETECTED
Influenza B: NOT DETECTED
Metapneumovirus: NOT DETECTED
Metapneumovirus: NOT DETECTED
Mycoplasma pneumoniae: NOT DETECTED
Mycoplasma pneumoniae: NOT DETECTED
Parainfluenza Virus 1: NOT DETECTED
Parainfluenza Virus 1: NOT DETECTED
Parainfluenza Virus 2: NOT DETECTED
Parainfluenza Virus 2: NOT DETECTED
Parainfluenza Virus 3: NOT DETECTED
Parainfluenza Virus 3: NOT DETECTED
Parainfluenza Virus 4: NOT DETECTED
Parainfluenza Virus 4: NOT DETECTED
Respiratory Syncytial Virus: NOT DETECTED
Respiratory Syncytial Virus: NOT DETECTED
Rhinovirus / Enterovirus: NOT DETECTED
Rhinovirus / Enterovirus: NOT DETECTED

## 2019-10-15 LAB — CBC WITH DIFFERENTIAL/PLATELET
Abs Immature Granulocytes: 0.02 10*3/uL (ref 0.00–0.07)
Basophils Absolute: 0 10*3/uL (ref 0.0–0.1)
Basophils Relative: 0 %
Eosinophils Absolute: 0 10*3/uL (ref 0.0–0.5)
Eosinophils Relative: 0 %
HCT: 44.1 % (ref 39.0–52.0)
Hemoglobin: 15.2 g/dL (ref 13.0–17.0)
Immature Granulocytes: 0 %
Lymphocytes Relative: 12 %
Lymphs Abs: 0.5 10*3/uL — ABNORMAL LOW (ref 0.7–4.0)
MCH: 32.1 pg (ref 26.0–34.0)
MCHC: 34.5 g/dL (ref 30.0–36.0)
MCV: 93.2 fL (ref 80.0–100.0)
Monocytes Absolute: 0.1 10*3/uL (ref 0.1–1.0)
Monocytes Relative: 3 %
Neutro Abs: 3.8 10*3/uL (ref 1.7–7.7)
Neutrophils Relative %: 85 %
Platelets: 157 10*3/uL (ref 150–400)
RBC: 4.73 MIL/uL (ref 4.22–5.81)
RDW: 13.7 % (ref 11.5–15.5)
WBC: 4.5 10*3/uL (ref 4.0–10.5)
nRBC: 0 % (ref 0.0–0.2)

## 2019-10-15 LAB — CBC
HCT: 43 % (ref 39.0–52.0)
HCT: 41 % (ref 39.0–52.0)
Hemoglobin: 14.8 g/dL (ref 13.0–17.0)
Hemoglobin: 13.9 g/dL (ref 13.0–17.0)
MCH: 31.6 pg (ref 26.0–34.0)
MCH: 31.4 pg (ref 26.0–34.0)
MCHC: 34.4 g/dL (ref 30.0–36.0)
MCHC: 33.9 g/dL (ref 30.0–36.0)
MCV: 91.9 fL (ref 80.0–100.0)
MCV: 92.6 fL (ref 80.0–100.0)
Platelets: 147 10*3/uL — ABNORMAL LOW (ref 150–400)
Platelets: 142 10*3/uL — ABNORMAL LOW (ref 150–400)
RBC: 4.68 MIL/uL (ref 4.22–5.81)
RBC: 4.43 MIL/uL (ref 4.22–5.81)
RDW: 13.8 % (ref 11.5–15.5)
RDW: 13.7 % (ref 11.5–15.5)
WBC: 5.4 10*3/uL (ref 4.0–10.5)
WBC: 4 10*3/uL (ref 4.0–10.5)
nRBC: 0 % (ref 0.0–0.2)
nRBC: 0 % (ref 0.0–0.2)

## 2019-10-15 LAB — COMPREHENSIVE METABOLIC PANEL
ALT: 35 U/L (ref 0–44)
AST: 34 U/L (ref 15–41)
Albumin: 3 g/dL — ABNORMAL LOW (ref 3.5–5.0)
Alkaline Phosphatase: 66 U/L (ref 38–126)
Anion gap: 16 — ABNORMAL HIGH (ref 5–15)
BUN: 22 mg/dL — ABNORMAL HIGH (ref 6–20)
CO2: 22 mmol/L (ref 22–32)
Calcium: 8.6 mg/dL — ABNORMAL LOW (ref 8.9–10.3)
Chloride: 101 mmol/L (ref 98–111)
Creatinine, Ser: 0.8 mg/dL (ref 0.61–1.24)
GFR calc Af Amer: >60 mL/min (ref >60)
GFR calc non Af Amer: >60 mL/min (ref >60)
Glucose, Bld: 247 mg/dL — ABNORMAL HIGH (ref 70–99)
Potassium: 3.5 mmol/L (ref 3.5–5.1)
Sodium: 139 mmol/L (ref 135–145)
Total Bilirubin: 0.7 mg/dL (ref 0.3–1.2)
Total Protein: 7.2 g/dL (ref 6.5–8.1)

## 2019-10-15 LAB — LIPID PANEL
Cholesterol: 171 mg/dL (ref 0–200)
HDL: 31 mg/dL — ABNORMAL LOW (ref >40)
LDL Cholesterol: 118 mg/dL — ABNORMAL HIGH (ref 0–99)
Total CHOL/HDL Ratio: 5.5 RATIO
Triglycerides: 110 mg/dL (ref <150)
VLDL: 22 mg/dL (ref 0–40)

## 2019-10-15 LAB — BASIC METABOLIC PANEL
Anion gap: 14 (ref 5–15)
BUN: 22 mg/dL — ABNORMAL HIGH (ref 6–20)
CO2: 21 mmol/L — ABNORMAL LOW (ref 22–32)
Calcium: 8 mg/dL — ABNORMAL LOW (ref 8.9–10.3)
Chloride: 103 mmol/L (ref 98–111)
Creatinine, Ser: 0.75 mg/dL (ref 0.61–1.24)
GFR calc Af Amer: >60 mL/min (ref >60)
GFR calc non Af Amer: >60 mL/min (ref >60)
Glucose, Bld: 432 mg/dL — ABNORMAL HIGH (ref 70–99)
Potassium: 4.7 mmol/L (ref 3.5–5.1)
Sodium: 138 mmol/L (ref 135–145)

## 2019-10-15 LAB — C-REACTIVE PROTEIN
CRP: 21.6 mg/dL — ABNORMAL HIGH (ref <1.0)
CRP: 19.5 mg/dL — ABNORMAL HIGH (ref <1.0)
CRP: 19.6 mg/dL — ABNORMAL HIGH (ref <1.0)

## 2019-10-15 LAB — CREATININE, SERUM
Creatinine, Ser: 0.93 mg/dL (ref 0.61–1.24)
GFR calc Af Amer: >60 mL/min (ref >60)
GFR calc non Af Amer: >60 mL/min (ref >60)

## 2019-10-15 LAB — FERRITIN
Ferritin: 891 ng/mL — ABNORMAL HIGH (ref 24–336)
Ferritin: 885 ng/mL — ABNORMAL HIGH (ref 24–336)

## 2019-10-15 LAB — GLUCOSE, CAPILLARY
Glucose-Capillary: 435 mg/dL — ABNORMAL HIGH (ref 70–99)
Glucose-Capillary: 391 mg/dL — ABNORMAL HIGH (ref 70–99)
Glucose-Capillary: 273 mg/dL — ABNORMAL HIGH (ref 70–99)
Glucose-Capillary: 241 mg/dL — ABNORMAL HIGH (ref 70–99)
Glucose-Capillary: 231 mg/dL — ABNORMAL HIGH (ref 70–99)
Glucose-Capillary: 225 mg/dL — ABNORMAL HIGH (ref 70–99)
Glucose-Capillary: 232 mg/dL — ABNORMAL HIGH (ref 70–99)
Glucose-Capillary: 201 mg/dL — ABNORMAL HIGH (ref 70–99)
Glucose-Capillary: 179 mg/dL — ABNORMAL HIGH (ref 70–99)
Glucose-Capillary: 146 mg/dL — ABNORMAL HIGH (ref 70–99)
Glucose-Capillary: 191 mg/dL — ABNORMAL HIGH (ref 70–99)
Glucose-Capillary: 219 mg/dL — ABNORMAL HIGH (ref 70–99)
Glucose-Capillary: 165 mg/dL — ABNORMAL HIGH (ref 70–99)
Glucose-Capillary: 139 mg/dL — ABNORMAL HIGH (ref 70–99)

## 2019-10-15 LAB — FIBRIN DERIVATIVES D-DIMER (ARMC ONLY): Fibrin derivatives D-dimer (ARMC): 1130.61 ng/mL (FEU) — ABNORMAL HIGH (ref 0.00–499.00)

## 2019-10-15 LAB — TYPE AND SCREEN
ABO/RH(D): A POS
Antibody Screen: NEGATIVE

## 2019-10-15 LAB — INFLUENZA PANEL BY PCR (TYPE A & B)
Influenza A By PCR: NEGATIVE
Influenza B By PCR: NEGATIVE

## 2019-10-15 LAB — HEMOGLOBIN A1C
Hgb A1c MFr Bld: 9.3 % — ABNORMAL HIGH (ref 4.8–5.6)
Mean Plasma Glucose: 220.21 mg/dL

## 2019-10-15 LAB — HEPATITIS B SURFACE ANTIGEN: Hepatitis B Surface Ag: NONREACTIVE

## 2019-10-15 LAB — PHOSPHORUS
Phosphorus: 3.7 mg/dL (ref 2.5–4.6)
Phosphorus: 3.1 mg/dL (ref 2.5–4.6)

## 2019-10-15 LAB — LACTATE DEHYDROGENASE: LDH: 341 U/L — ABNORMAL HIGH (ref 98–192)

## 2019-10-15 LAB — ABO/RH: ABO/RH(D): A POS

## 2019-10-15 LAB — D-DIMER, QUANTITATIVE: D-Dimer, Quant: 1.08 ug/mL-FEU — ABNORMAL HIGH (ref 0.00–0.50)

## 2019-10-15 LAB — MRSA PCR SCREENING: MRSA by PCR: NEGATIVE

## 2019-10-15 LAB — BRAIN NATRIURETIC PEPTIDE: B Natriuretic Peptide: 33.1 pg/mL (ref 0.0–100.0)

## 2019-10-15 LAB — HIV ANTIBODY (ROUTINE TESTING W REFLEX): HIV Screen 4th Generation wRfx: NONREACTIVE

## 2019-10-15 MED ORDER — TOCILIZUMAB 400 MG/20ML IV SOLN
800.0000 mg | Freq: Once | INTRAVENOUS | Status: AC
  Administered 2019-10-15: 17:00:00 800 mg via INTRAVENOUS
  Filled 2019-10-15: qty 40

## 2019-10-15 MED ORDER — SODIUM CHLORIDE 0.9 % IV SOLN: 100.0000 mg | Freq: Every day | INTRAVENOUS | Status: DC

## 2019-10-15 MED ORDER — ACETAMINOPHEN 325 MG PO TABS: 650.0000 mg | ORAL_TABLET | Freq: Four times a day (QID) | ORAL | Status: DC | PRN

## 2019-10-15 MED ORDER — ASCORBIC ACID 500 MG PO TABS
500.0000 mg | ORAL_TABLET | Freq: Every day | ORAL | Status: DC
  Administered 2019-10-15 – 2019-10-23 (×9): 500 mg via ORAL
  Filled 2019-10-15 (×10): qty 1

## 2019-10-15 MED ORDER — ASPIRIN EC 81 MG PO TBEC
81.0000 mg | DELAYED_RELEASE_TABLET | Freq: Every day | ORAL | Status: DC
  Administered 2019-10-15 – 2019-10-23 (×9): 81 mg via ORAL
  Filled 2019-10-15 (×9): qty 1

## 2019-10-15 MED ORDER — ENOXAPARIN SODIUM 40 MG/0.4ML ~~LOC~~ SOLN: 40.0000 mg | SUBCUTANEOUS | Status: DC

## 2019-10-15 MED ORDER — OXYCODONE HCL 5 MG PO TABS
5.0000 mg | ORAL_TABLET | ORAL | Status: DC | PRN
  Administered 2019-10-16: 5 mg via ORAL
  Filled 2019-10-15: qty 1

## 2019-10-15 MED ORDER — THIAMINE HCL 100 MG/ML IJ SOLN
100.0000 mg | Freq: Every day | INTRAMUSCULAR | Status: DC
  Administered 2019-10-15 – 2019-10-18 (×4): 100 mg via INTRAVENOUS
  Filled 2019-10-15 (×4): qty 2

## 2019-10-15 MED ORDER — DEXAMETHASONE SODIUM PHOSPHATE 10 MG/ML IJ SOLN
6.0000 mg | INTRAMUSCULAR | Status: DC
  Administered 2019-10-15 – 2019-10-18 (×4): 6 mg via INTRAVENOUS
  Filled 2019-10-15 (×5): qty 1

## 2019-10-15 MED ORDER — ENOXAPARIN SODIUM 120 MG/0.8ML ~~LOC~~ SOLN
1.0000 mg/kg | Freq: Two times a day (BID) | SUBCUTANEOUS | Status: DC
  Administered 2019-10-15: 11:00:00 115 mg via SUBCUTANEOUS
  Filled 2019-10-15 (×2): qty 0.8

## 2019-10-15 MED ORDER — ENOXAPARIN SODIUM 60 MG/0.6ML ~~LOC~~ SOLN
50.0000 mg | Freq: Two times a day (BID) | SUBCUTANEOUS | Status: DC
  Administered 2019-10-15 – 2019-10-18 (×7): 50 mg via SUBCUTANEOUS
  Filled 2019-10-15 (×7): qty 0.6

## 2019-10-15 MED ORDER — ONDANSETRON HCL 4 MG/2ML IJ SOLN: 4.0000 mg | Freq: Four times a day (QID) | INTRAMUSCULAR | Status: DC | PRN

## 2019-10-15 MED ORDER — SODIUM CHLORIDE 0.9 % IV SOLN
INTRAVENOUS | Status: DC
  Administered 2019-10-15: 11:00:00 via INTRAVENOUS

## 2019-10-15 MED ORDER — SODIUM CHLORIDE 0.9 % IV SOLN: 200.0000 mg | Freq: Once | INTRAVENOUS | Status: DC

## 2019-10-15 MED ORDER — ONDANSETRON HCL 4 MG PO TABS: 4.0000 mg | ORAL_TABLET | Freq: Four times a day (QID) | ORAL | Status: DC | PRN

## 2019-10-15 MED ORDER — IPRATROPIUM-ALBUTEROL 0.5-2.5 (3) MG/3ML IN SOLN
3.0000 mL | Freq: Four times a day (QID) | RESPIRATORY_TRACT | Status: DC
  Administered 2019-10-15: 3 mL via RESPIRATORY_TRACT
  Filled 2019-10-15: qty 3

## 2019-10-15 MED ORDER — DEXTROSE 50 % IV SOLN: 0.0000 mL | INTRAVENOUS | Status: DC | PRN

## 2019-10-15 MED ORDER — ALBUTEROL SULFATE HFA 108 (90 BASE) MCG/ACT IN AERS
1.0000 | INHALATION_SPRAY | Freq: Four times a day (QID) | RESPIRATORY_TRACT | Status: DC
  Administered 2019-10-15: 1 via RESPIRATORY_TRACT
  Filled 2019-10-15 (×2): qty 6.7

## 2019-10-15 MED ORDER — POLYETHYLENE GLYCOL 3350 17 G PO PACK: 17.0000 g | PACK | Freq: Every day | ORAL | Status: DC | PRN

## 2019-10-15 MED ORDER — SODIUM CHLORIDE 0.9 % IV SOLN
100.0000 mg | Freq: Every day | INTRAVENOUS | Status: AC
  Administered 2019-10-16 – 2019-10-19 (×4): 100 mg via INTRAVENOUS
  Filled 2019-10-15 (×4): qty 20

## 2019-10-15 MED ORDER — GUAIFENESIN-DM 100-10 MG/5ML PO SYRP: 10.0000 mL | ORAL_SOLUTION | ORAL | Status: DC | PRN

## 2019-10-15 MED ORDER — INSULIN REGULAR(HUMAN) IN NACL 100-0.9 UT/100ML-% IV SOLN
INTRAVENOUS | Status: DC
  Administered 2019-10-15: 20 [IU]/h via INTRAVENOUS
  Administered 2019-10-15: 30 [IU]/h via INTRAVENOUS
  Administered 2019-10-15: 21:00:00 14 [IU]/h via INTRAVENOUS
  Filled 2019-10-15 (×4): qty 100

## 2019-10-15 MED ORDER — FOLIC ACID 5 MG/ML IJ SOLN
1.0000 mg | Freq: Every day | INTRAMUSCULAR | Status: DC
  Administered 2019-10-15 – 2019-10-18 (×4): 1 mg via INTRAVENOUS
  Filled 2019-10-15 (×4): qty 0.2

## 2019-10-15 MED ORDER — ZINC SULFATE 220 (50 ZN) MG PO CAPS
220.0000 mg | ORAL_CAPSULE | Freq: Every day | ORAL | Status: DC
  Administered 2019-10-15 – 2019-10-23 (×9): 220 mg via ORAL
  Filled 2019-10-15 (×9): qty 1

## 2019-10-15 MED ORDER — DEXTROSE-NACL 5-0.45 % IV SOLN
INTRAVENOUS | Status: DC
  Administered 2019-10-15: 12:00:00 via INTRAVENOUS

## 2019-10-15 MED ORDER — HYDROCOD POLST-CPM POLST ER 10-8 MG/5ML PO SUER
5.0000 mL | Freq: Two times a day (BID) | ORAL | Status: DC | PRN
  Administered 2019-10-22: 5 mL via ORAL
  Filled 2019-10-15: qty 5

## 2019-10-15 MED ORDER — IPRATROPIUM-ALBUTEROL 20-100 MCG/ACT IN AERS
1.0000 | INHALATION_SPRAY | Freq: Four times a day (QID) | RESPIRATORY_TRACT | Status: DC
  Administered 2019-10-15 – 2019-10-23 (×31): 1 via RESPIRATORY_TRACT
  Filled 2019-10-15: qty 4

## 2019-10-15 NOTE — Plan of Care (Signed)
Pt updated on POC, verbalized understanding 

## 2019-10-15 NOTE — Progress Notes (Signed)
pts wallet with driver's license and credit card, and pocket knife taken to security by secretary and placed in safe. Yellow receipt placed in chart.

## 2019-10-15 NOTE — ED Notes (Addendum)
Pt placed on nonrebreather after desatting to 80% on high flow [place] per Dr Izetta Dakin- pt O2 sats improved to 86%

## 2019-10-15 NOTE — Progress Notes (Signed)
Insulin gtt changed to 20 Units/hr per endotool.  MD Sherral Hammers to place insulin gtt orders soon.

## 2019-10-15 NOTE — H&P (Signed)
History and Physical    Steven Holmes WNU:[id number] DOB: 31-Oct-1961 DOA: 10/14/2019  [facility]P: [facility] [place] (Confirm with patient/family/NH records and if not entered, this has to be entered at [facility] point of entry) Patient coming from: home  I have personally briefly reviewed patient's old medical records in [place]  Chief Complaint: shortness of breath  HPI: Steven Holmes is a 58 y.o. male with medical history significant for diabetes, hypertension and obstructive sleep apnea, diagnosed with COVID-19 infection on 10/03/2019 presented to the emergency room with a several day history of worsening shortness of breath.  He denies associated chest.  On arrival in the emergency room He was hypoxic with O2 sat in the mid 80s.  He was placed on oxygen via nonrebreather continue to have sats around 90.  He was subsequently placed on oxygen via high flow nasal cannula.  He was afebrile, with respirations of 18 pulse rate of 101.  White cell count was normal.  As well as hemoglobin.  Significant abnormal findings included evaded Covid biomarkers with LDH of 365, D-dimer of 1154 and ferritin of 811.  Blood sugar was elevated at 329.  In the emergency room he received IV Decadron as well as Solu-Medrol and started on remdesivir.  Review of Systems: Review of systems limited due to clinical state of patient.   Past Medical History:  Diagnosis Date  . Complex regional pain syndrome   . COVID-19   . Diabetes mellitus without complication ([place])    type 2  . Diverticulosis   . DJD (degenerative joint disease), lumbar   . Hypertension     Past Surgical History:  Procedure Laterality Date  . HERNIA REPAIR  2009   revision left sided inguinal at [facility]  . VASECTOMY       reports that he quit smoking about 18 years ago. His smoking use included cigarettes. He has a 25.00 pack-year smoking history. He has never used smokeless tobacco. He reports that he does not drink  alcohol or use drugs.  Allergies  Allergen Reactions  . Lisinopril     Multiple reactions     Family History  Problem Relation Age of Onset  . Cancer Mother        skin Ca  . Cancer Father        basal cell , nose      Prior to Admission medications   Medication Sig Start Date End Date Taking? Authorizing Provider  albuterol (PROVENTIL HFA;VENTOLIN HFA) 108 (90 Base) MCG/ACT inhaler Inhale 2 puffs into the lungs every 6 (six) hours as needed for wheezing or shortness of breath. 10/17/16   Menshew, Dannielle Karvonen, PA-C  ALFALFA PO Take 2 tablets by mouth 3 (three) times daily.    [provider]  atorvastatin (LIPITOR) 20 MG tablet Take 1 tablet (20 mg total) by mouth daily. 02/15/15   Crecencio Mc, MD  benzonatate (TESSALON PERLES) 100 MG capsule Take 1 capsule (100 mg total) by mouth 3 (three) times daily as needed for cough (Take 1-2 per dose). 10/17/16   Menshew, Dannielle Karvonen, PA-C  cyclobenzaprine (FLEXERIL) 5 MG tablet Take 1 tablet (5 mg total) by mouth 3 (three) times daily as needed for muscle spasms. 01/22/17   Menshew, Dannielle Karvonen, PA-C  fluticasone (FLONASE) 50 MCG/ACT nasal spray Place 2 sprays into both nostrils daily. 10/17/16   Menshew, Dannielle Karvonen, PA-C  furosemide (LASIX) 20 MG tablet Take 1 tablet (20 mg  total) by mouth daily. As needed for fluid retention 02/13/15   Sherlene Shams, MD  glipiZIDE (GLUCOTROL) 5 MG tablet TAKE ONE TABLET BY MOUTH TWICE DAILY BEFORE A MEAL 09/13/14   Sherlene Shams, MD  guaiFENesin-codeine (ROBITUSSIN AC) 100-10 MG/5ML syrup Take 5 mLs by mouth 3 (three) times daily as needed for up to 8 days for cough. 10/08/19 10/16/19  Concha Se, MD  ketorolac (TORADOL) 10 MG tablet Take 1 tablet (10 mg total) by mouth every 8 (eight) hours. 01/22/17   Menshew, Charlesetta Ivory, PA-C  losartan (COZAAR) 25 MG tablet TAKE ONE TABLET BY MOUTH ONCE DAILY 03/09/15   Sherlene Shams, MD  metFORMIN (GLUCOPHAGE) 500 MG tablet TAKE ONE TABLET  BY MOUTH TWICE DAILY WITH MEALS 10/09/14   Sherlene Shams, MD  methocarbamol (ROBAXIN-750) 750 MG tablet Take 1 tablet (750 mg total) by mouth 4 (four) times daily. Patient not taking: Reported on 02/13/2015 09/06/13   Lorre Nick, MD  Multiple Vitamin (MULTIVITAMIN) tablet Take 1 tablet by mouth daily.    [provider]  nystatin cream (MYCOSTATIN) Apply 1 application topically 2 (two) times daily. Until rash has resolved Patient not taking: Reported on 02/13/2015 05/10/14   Sherlene Shams, MD  oxyCODONE-acetaminophen (PERCOCET/ROXICET) 5-325 MG per tablet Take 2 tablets by mouth every 4 (four) hours as needed for severe pain. Patient not taking: Reported on 02/13/2015 09/06/13   Lorre Nick, MD  traMADol (ULTRAM) 50 MG tablet Take 1 tablet (50 mg total) by mouth every 8 (eight) hours as needed. 02/13/15   Sherlene Shams, MD    Physical Exam: Vitals:   10/14/19 2330 10/15/19 0000 10/15/19 0030 10/15/19 0100  BP: (!) 148/105 (!) 146/111 (!) 130/92 (!) 135/94  Pulse: 89 92 92 89  Resp: (!) 28 (!) 24 (!) 39 (!) 36  Temp:      SpO2: 91% 90% (!) 88% 92%  Weight:      Height:        Constitutional: NAD, calm, comfortable Vitals:   10/14/19 2330 10/15/19 0000 10/15/19 0030 10/15/19 0100  BP: (!) 148/105 (!) 146/111 (!) 130/92 (!) 135/94  Pulse: 89 92 92 89  Resp: (!) 28 (!) 24 (!) 39 (!) 36  Temp:      SpO2: 91% 90% (!) 88% 92%  Weight:      Height:       Eyes: PERRL, lids and conjunctivae normal ENMT: Not examined due to.  Neck: normal, supple, no masses, no thyromegaly Respiratory: Patient is tachypneic, decreased air entry bilaterally but no rales or rhonchi..  Cardiovascular: Tachycardic without no murmurs / rubs / gallops. No extremity edema. 2+ pedal pulses. No carotid bruits.  Abdomen: no tenderness, no masses palpated. No hepatosplenomegaly.  Musculoskeletal: no clubbing / cyanosis. No joint deformity upper and lower extremities. Good ROM, no contractures. Normal  muscle tone.  Skin: no rashes, lesions, ulcers. No induration Neurologic: Grossly intact psychiatric: Normal judgment and insight. Alert and oriented x 3. Normal mood.   Labs on Admission: I have personally reviewed following labs and imaging studies  CBC: Recent Labs  Lab 10/08/19 1240 10/14/19 2023  WBC 3.0* 5.5  NEUTROABS  --  4.4  HGB 16.7 15.2  HCT 46.8 43.6  MCV 89.0 90.6  PLT 115* 143*   Basic Metabolic Panel: Recent Labs  Lab 10/08/19 1240 10/14/19 2023  NA 133* 136  K 4.2 3.9  CL 98 100  CO2 23 24  GLUCOSE 316*  329*  BUN 15 19  CREATININE 0.95 0.94  CALCIUM 8.4* 8.1*   GFR: Estimated Creatinine Clearance: 104.7 mL/min (by C-G formula based on SCr of 0.94 mg/dL). Liver Function Tests: Recent Labs  Lab 10/14/19 2023  AST 33  ALT 32  ALKPHOS 67  BILITOT 1.2  PROT 7.3  ALBUMIN 3.3*   No results for input(s): LIPASE, AMYLASE in the last 168 hours. No results for input(s): AMMONIA in the last 168 hours. Coagulation Profile: No results for input(s): INR, PROTIME in the last 168 hours. Cardiac Enzymes: No results for input(s): CKTOTAL, CKMB, CKMBINDEX, TROPONINI in the last 168 hours. BNP (last 3 results) No results for input(s): PROBNP in the last 8760 hours. HbA1C: No results for input(s): HGBA1C in the last 72 hours. CBG: No results for input(s): GLUCAP in the last 168 hours. Lipid Profile: No results for input(s): CHOL, HDL, LDLCALC, TRIG, CHOLHDL, LDLDIRECT in the last 72 hours. Thyroid Function Tests: No results for input(s): TSH, T4TOTAL, FREET4, T3FREE, THYROIDAB in the last 72 hours. Anemia Panel: Recent Labs    10/14/19 2023  FERRITIN 811*   Urine analysis:    Component Value Date/Time   COLORURINE YELLOW 09/06/2013 2047   APPEARANCEUR CLEAR 09/06/2013 2047   LABSPEC 1.024 09/06/2013 2047   PHURINE 5.0 09/06/2013 2047   GLUCOSEU NEGATIVE 09/06/2013 2047   HGBUR NEGATIVE 09/06/2013 2047   BILIRUBINUR NEGATIVE 09/06/2013 2047    KETONESUR NEGATIVE 09/06/2013 2047   PROTEINUR NEGATIVE 09/06/2013 2047   UROBILINOGEN 0.2 09/06/2013 2047   NITRITE NEGATIVE 09/06/2013 2047   LEUKOCYTESUR NEGATIVE 09/06/2013 2047    Radiological Exams on Admission: DG Chest 2 View  Result Date: 10/14/2019 CLINICAL DATA:  Shortness of breath, cough, COVID-19 positive EXAM: CHEST - 2 VIEW COMPARISON:  10/08/2019 FINDINGS: Heart size is mildly enlarged, unchanged. Interval development of diffuse patchy airspace opacities throughout both lungs, slightly more confluent within the lung bases. No pleural effusion or pneumothorax IMPRESSION: Interval development of diffuse patchy airspace opacities throughout both lungs, slightly more confluent within the lung bases, suspicious for multifocal atypical/viral pneumonia. Electronically Signed   By: Duanne GuessNicholas  Plundo M.D.   On: 10/14/2019 17:43    Assessment/Plan    Acute hypoxic respiratory failure due to COVID-19 pneumonia --Continue high flow via nasal cannula titrate to keep sats of 88% --Proning protocol as tolerated --Continue IV Decadron --Given elevated D-dimer increasing oxygen needs, will give therapeutic dose Lovenox --Remdesivir, --Vitamin C and zinc and multivitamin  Hyperglycemia secondary to type 2 diabetes --Continue insulin coverage --hold home meds    HTN (hypertension) --Continue home meds    OSA (obstructive sleep apnea) --Hold CPAP     DVT prophylaxis: on full dose lovenox Code Status: full Disposition Plan: [facility] [place] [facility]    Audre Cenci V Charmeka Freeburg MD [facility]   If 7PM-7AM, please contact night-coverage [email or web address] Password [email or web address]  10/15/2019, 1:31 AM

## 2019-10-15 NOTE — Progress Notes (Signed)
One pair of tennis shoes, one pair of pants with belt, and a cell phone ,charger, and glasses

## 2019-10-15 NOTE — ED Notes (Signed)
Message sent to Dr Damita Dunnings for pt O2

## 2019-10-15 NOTE — ED Notes (Signed)
Pt unable to sign for transport d/t signature pad not working- pt gave verbal permission for transport

## 2019-10-15 NOTE — Progress Notes (Signed)
MEDICATION RELATED CONSULT NOTE - INITIAL   Pharmacy Consult for CCM monitoring Indication: Pharmacy may adjust ABX as needed for renal fxn  Allergies  Allergen Reactions  . Lisinopril     Multiple reactions     Patient Measurements: Height: 5\' 8"  (172.7 cm) Weight: 250 lb (113.4 kg) IBW/kg (Calculated) : 68.4  Vital Signs: Temp: 98.6 F (37 C) (12/11 1621) BP: 135/94 (12/12 0100) Pulse Rate: 89 (12/12 0100) Intake/Output from previous day: 12/11 0701 - 12/12 0700 In: 293.8 [IV Piggyback:293.8] Out: -  Intake/Output from this shift: Total I/O In: 293.8 [IV Piggyback:293.8] Out: -   Labs: Recent Labs    10/14/19 2023 10/15/19 0039  WBC 5.5 5.4  HGB 15.2 14.8  HCT 43.6 43.0  PLT 143* 147*  CREATININE 0.94 0.93  ALBUMIN 3.3*  --   PROT 7.3  --   AST 33  --   ALT 32  --   ALKPHOS 67  --   BILITOT 1.2  --    Estimated Creatinine Clearance: 105.8 mL/min (by C-G formula based on SCr of 0.93 mg/dL).  Microbiology: No results found for this or any previous visit (from the past 720 hour(s)).  Medical History: Past Medical History:  Diagnosis Date  . Complex regional pain syndrome   . COVID-19   . Diabetes mellitus without complication ([place])    type 2  . Diverticulosis   . DJD (degenerative joint disease), lumbar   . Hypertension     Medications:  (Not in a hospital admission)  Assessment: 58 yo male has been started on remdesivir.  Pt has good renal fxn.  No changes needed at this time.  Plan:  Pharmacy will continue to monitor and make recommendations as warranted.   [place] Crane, Amisadai Woodford A 10/15/2019,2:46 AM

## 2019-10-15 NOTE — ED Notes (Signed)
Report given to [facility]- ETA 35-40 minutes

## 2019-10-15 NOTE — ED Notes (Signed)
Kim at [facility] called for update status

## 2019-10-15 NOTE — ED Notes (Signed)
Called pharmacy and they stated they were sending his inhaler now

## 2019-10-15 NOTE — ED Notes (Signed)
Sent pharmacy a message for albuterol inhaler

## 2019-10-15 NOTE — Progress Notes (Signed)
Paged MD Sherral Hammers for admission orders.

## 2019-10-15 NOTE — ED Notes (Addendum)
Steward Ros admitting Dr a message concerning pt's O2 sats

## 2019-10-15 NOTE — Progress Notes (Signed)
MD stated he would place admission orders soon, keep insulin gtt on for now.

## 2019-10-15 NOTE — Progress Notes (Signed)
[facility] is at the ER to transfer the Pt to the [facility] at the request of the admitting MD.  O2 need increased significantly compared to admission.  Changed to [place]ura mask as required.   EMTALA signed.

## 2019-10-15 NOTE — H&P (Signed)
[facility] History and Physical  Lorrene ReidKenneth Durand Sehgal ZOX:[id number] DOB: 07/22/1961 DOA: 10/15/2019  Referring physician:  [facility]: [facility]   Chief Complaint: S OB/WOB  HPI: 58 y.o. WM PMHx diabetes type 2 controlled with complication, HTN, HLD, OSA/OHS, morbid obesity, complex regional pain syndrome, diagnosed with COVID-19 infection on 10/03/2019   Presented to the emergency room with a several day history of worsening shortness of breath.  He denies associated chest.  On arrival in the emergency room He was hypoxic with O2 sat in the mid 80s.  He was placed on oxygen via nonrebreather continue to have sats around 90.  He was subsequently placed on oxygen via high flow nasal cannula.  He was afebrile, with respirations of 18 pulse rate of 101.  White cell count was normal.  As well as hemoglobin.  Significant abnormal findings included evaded Covid biomarkers with LDH of 365, D-dimer of 1154 and ferritin of 811.  Blood sugar was elevated at 329.  In the emergency room he received IV Decadron as well as Solu-Medrol and started on remdesivir  Patient states wife also Covid positive sick but at home has been in and out of hospital multiple times.    Review of Systems:  Constitutional:  No weight loss, night sweats, positive fevers, chills, fatigue.  HEENT:  No headaches, Difficulty swallowing,Tooth/dental problems,Sore throat,  No sneezing, itching, ear ache, nasal congestion, post nasal drip,  Cardio-vascular:  No chest pain, Orthopnea, PND,positive  swelling in lower extremities, negative anasarca, dizziness, palpitations  GI:  No heartburn, indigestion, abdominal pain, nausea, vomiting, diarrhea, change in bowel habits, loss of appetite  Resp:  positive  shortness of breath with exertion or at rest. No excess mucus, no productive cough, No non-productive cough, No coughing up of blood.No change in color of mucus.No wheezing.No chest wall deformity  Skin:  no  rash or lesions.  GU:  no dysuria, change in color of urine, no urgency or frequency. No flank pain.  Musculoskeletal:  No joint pain or swelling. No decreased range of motion. No back pain.  Psych:  No change in mood or affect. No depression or anxiety. No memory loss.   Past Medical History:  Diagnosis Date  . Complex regional pain syndrome    CRPS  . COVID-19   . Diabetes mellitus without complication (HCC)    type 2  . Diverticulosis   . DJD (degenerative joint disease), lumbar   . Hypertension    Past Surgical History:  Procedure Laterality Date  . HERNIA REPAIR  2009   [facility]  . VASECTOMY     Social History:  reports that he quit smoking about 18 years ago. His smoking use included cigarettes. He has a 25.00 pack-year smoking history. He has never used smokeless tobacco. He reports that he does not drink alcohol or use drugs.  Allergies  Allergen Reactions  . Lisinopril Other (See Comments)    Multiple reactions including dizziness and depression    Family History  Problem Relation Age of Onset  . Cancer Mother        skin Ca  . Cancer Father        basal cell , nose      Prior to Admission medications   Medication Sig Start Date End Date Taking? Authorizing Provider  albuterol (PROVENTIL HFA;VENTOLIN HFA) 108 (90 Base) MCG/ACT inhaler Inhale 2 puffs into the lungs every 6 (six) hours as needed for wheezing or shortness of  breath. 10/17/16   Menshew, Charlesetta Ivory, PA-C  ALFALFA PO Take 2 tablets by mouth 3 (three) times daily.    [provider]  atorvastatin (LIPITOR) 20 MG tablet Take 1 tablet (20 mg total) by mouth daily. 02/15/15   Sherlene Shams, MD  benzonatate (TESSALON PERLES) 100 MG capsule Take 1 capsule (100 mg total) by mouth 3 (three) times daily as needed for cough (Take 1-2 per dose). 10/17/16   Menshew, Charlesetta Ivory, PA-C  cyclobenzaprine (FLEXERIL) 5 MG tablet Take 1 tablet (5 mg total) by mouth 3 (three)  times daily as needed for muscle spasms. 01/22/17   Menshew, Charlesetta Ivory, PA-C  fluticasone (FLONASE) 50 MCG/ACT nasal spray Place 2 sprays into both nostrils daily. 10/17/16   Menshew, Charlesetta Ivory, PA-C  furosemide (LASIX) 20 MG tablet Take 1 tablet (20 mg total) by mouth daily. As needed for fluid retention 02/13/15   Sherlene Shams, MD  glipiZIDE (GLUCOTROL) 5 MG tablet TAKE ONE TABLET BY MOUTH TWICE DAILY BEFORE A MEAL 09/13/14   Sherlene Shams, MD  guaiFENesin-codeine (ROBITUSSIN AC) 100-10 MG/5ML syrup Take 5 mLs by mouth 3 (three) times daily as needed for up to 8 days for cough. 10/08/19 10/16/19  Concha Se, MD  ketorolac (TORADOL) 10 MG tablet Take 1 tablet (10 mg total) by mouth every 8 (eight) hours. 01/22/17   Menshew, Charlesetta Ivory, PA-C  losartan (COZAAR) 25 MG tablet TAKE ONE TABLET BY MOUTH ONCE DAILY 03/09/15   Sherlene Shams, MD  metFORMIN (GLUCOPHAGE) 500 MG tablet TAKE ONE TABLET BY MOUTH TWICE DAILY WITH MEALS 10/09/14   Sherlene Shams, MD  methocarbamol (ROBAXIN-750) 750 MG tablet Take 1 tablet (750 mg total) by mouth 4 (four) times daily. Patient not taking: Reported on 02/13/2015 09/06/13   Lorre Nick, MD  Multiple Vitamin (MULTIVITAMIN) tablet Take 1 tablet by mouth daily.    [provider]  nystatin cream (MYCOSTATIN) Apply 1 application topically 2 (two) times daily. Until rash has resolved Patient not taking: Reported on 02/13/2015 05/10/14   Sherlene Shams, MD  oxyCODONE-acetaminophen (PERCOCET/ROXICET) 5-325 MG per tablet Take 2 tablets by mouth every 4 (four) hours as needed for severe pain. Patient not taking: Reported on 02/13/2015 09/06/13   Lorre Nick, MD  traMADol (ULTRAM) 50 MG tablet Take 1 tablet (50 mg total) by mouth every 8 (eight) hours as needed. 02/13/15   Sherlene Shams, MD     Consultants:    Procedures/Significant Events:  12/12 PCXR;Hypoinflation of the lungs with bibasilar and perihilar opacities concerning for  pneumonia.  I have personally reviewed and interpreted all radiology studies and my findings are as above.   VENTILATOR SETTINGS: HHFNC 12/12 Flow 40 L/min FiO2; 100% SPO2; 89%   Cultures 12/12 blood pending 12/12 influenza A/B negative 12/12 respiratory virus panel pending 12/12 HIV screen negative   Antimicrobials: Anti-infectives (From admission, onward)   Start     Dose/Rate Stop   10/16/19 1000  remdesivir 100 mg in sodium chloride 0.9 % 100 mL IVPB  Status:  Discontinued     100 mg 200 mL/hr over 30 Minutes 10/15/19 1035   10/16/19 1000  remdesivir 100 mg in sodium chloride 0.9 % 100 mL IVPB     100 mg 200 mL/hr over 30 Minutes 10/20/19 0959   10/15/19 1030  remdesivir 200 mg in sodium chloride 0.9% 250 mL IVPB  Status:  Discontinued     200 mg  580 mL/hr over 30 Minutes 10/15/19 1037       Devices    LINES / TUBES:      Continuous Infusions:  Physical Exam: Vitals:   10/15/19 1300 10/15/19 1315 10/15/19 1445 10/15/19 1505  BP: (!) 149/94 (!) 149/94 138/87 140/89  Pulse: (!) 103 (!) 104 97 96  Resp: 15 (!) 26 (!) 33 (!) 23  Temp:      TempSrc:      SpO2: (!) 85% (!) 86% (!) 89% 90%  Weight:      Height:        Wt Readings from Last 3 Encounters:  10/15/19 108.7 kg  10/14/19 113.4 kg  10/08/19 113.4 kg    General: A/O x4, positive acute respiratory distress Eyes: negative scleral hemorrhage, negative anisocoria, negative icterus ENT: Negative Runny nose, negative gingival bleeding, Neck:  Negative scars, masses, torticollis, lymphadenopathy, JVD Lungs: Tachypneic, diffuse decreased breath sounds, without wheezes or crackles Cardiovascular: Regular rate and rhythm without murmur gallop or rub normal S1 and S2 Abdomen: MORBIDLY OBESE negative abdominal pain, nondistended, positive soft, bowel sounds, no rebound, no ascites, no appreciable mass Extremities: No significant cyanosis, clubbing, or edema bilateral lower extremities Skin: Negative  rashes, lesions, ulcers Psychiatric:  Negative depression, negative anxiety, negative fatigue, negative mania  Central nervous system:  Cranial nerves II through XII intact, tongue/uvula midline, all extremities muscle strength 5/5, sensation intact throughout, negative dysarthria, negative expressive aphasia, negative receptive aphasia.        Labs on Admission:  Basic Metabolic Panel: Recent Labs  Lab 10/14/19 2023 10/15/19 0039 10/15/19 0640 10/15/19 1100  NA 136  --  138 139  K 3.9  --  4.7 3.5  CL 100  --  103 101  CO2 24  --  21* 22  GLUCOSE 329*  --  432* 247*  BUN 19  --  22* 22*  CREATININE 0.94 0.93 0.75 0.80  CALCIUM 8.1*  --  8.0* 8.6*  MG  --   --  2.5* 2.3  PHOS  --   --  3.7 3.1   Liver Function Tests: Recent Labs  Lab 10/14/19 2023 10/15/19 1100  AST 33 34  ALT 32 35  ALKPHOS 67 66  BILITOT 1.2 0.7  PROT 7.3 7.2  ALBUMIN 3.3* 3.0*   No results for input(s): LIPASE, AMYLASE in the last 168 hours. No results for input(s): AMMONIA in the last 168 hours. CBC: Recent Labs  Lab 10/14/19 2023 10/15/19 0039 10/15/19 0640 10/15/19 1100  WBC 5.5 5.4 4.0 4.5  NEUTROABS 4.4  --   --  3.8  HGB 15.2 14.8 13.9 15.2  HCT 43.6 43.0 41.0 44.1  MCV 90.6 91.9 92.6 93.2  PLT 143* 147* 142* 157   Cardiac Enzymes: No results for input(s): CKTOTAL, CKMB, CKMBINDEX, TROPONINI in the last 168 hours.  BNP (last 3 results) Recent Labs    10/15/19 1100  BNP 33.1    ProBNP (last 3 results) No results for input(s): PROBNP in the last 8760 hours.  CBG: Recent Labs  Lab 10/15/19 0813 10/15/19 1013 10/15/19 1147 10/15/19 1248 10/15/19 1343  GLUCAP 391* 273* 241* 231* 225*    Radiological Exams on Admission: DG Chest 2 View  Result Date: 10/14/2019 CLINICAL DATA:  Shortness of breath, cough, COVID-19 positive EXAM: CHEST - 2 VIEW COMPARISON:  10/08/2019 FINDINGS: Heart size is mildly enlarged, unchanged. Interval development of diffuse patchy airspace  opacities throughout both lungs, slightly more confluent within the lung bases.  No pleural effusion or pneumothorax IMPRESSION: Interval development of diffuse patchy airspace opacities throughout both lungs, slightly more confluent within the lung bases, suspicious for multifocal atypical/viral pneumonia. Electronically Signed   By: Duanne Guess M.D.   On: 10/14/2019 17:43   Portable chest 1 View  Result Date: 10/15/2019 CLINICAL DATA:  Shortness of breath. EXAM: PORTABLE CHEST 1 VIEW COMPARISON:  October 14, 2019. FINDINGS: Stable cardiomegaly. No pneumothorax is noted. Hypoinflation of the lungs is noted with bibasilar and perihilar opacities concerning for pneumonia. Bony thorax is unremarkable. IMPRESSION: Hypoinflation of the lungs with bibasilar and perihilar opacities concerning for pneumonia. Electronically Signed   By: Lupita Raider M.D.   On: 10/15/2019 12:55    EKG: Independently reviewed.  Pending  Assessment/Plan Active Problems:   HTN (hypertension)   OSA (obstructive sleep apnea)   Diabetes mellitus type 2, controlled, with complications (HCC)   Essential hypertension   Obesity hypoventilation syndrome (HCC)   Obesity, Class III, BMI 40-49.9 (morbid obesity) (HCC)   Complex regional pain syndrome I   Pneumonia due to COVID-19 virus   Hyperosmolar hyperglycemic state (HHS) (HCC)  Covid pneumonia/acute respiratory failure with hypoxia COVID-19 Labs  Recent Labs    10/14/19 2023 10/15/19 0640 10/15/19 1100  DDIMER  --   --  1.08*  FERRITIN 811* 891* 885*  LDH 365*  --  341*  CRP 21.6*  --  19.6*    No results found for: SARSCOV2NAA -Decadron 6 mg daily -Remdesivir per pharmacy protocol -12/12 Actemra x1 dose -Combivent -Flutter valve -Prone 16 hours/day; if cannot tolerate prone 2 to 3 hours per shift -Titrate O2 to maintain SPO2> 88% -Vitamins per Covid protocol  OSA/OHS -See Covid pneumonia  Essential HTN -BP controlled without medication,  monitor closely  Diabetes type 2 controlled with complication/Hyperosmolar Hyperglycemia Nonketotic Syndrome -Endo tool protocol -When patient more stable consult diabetic nutrition -When patient more stable consult diabetic coordinator  Complex regional pain syndrome - PRN pain medication  Morbid obesity -See diabetes       Code Status: Full (DVT Prophylaxis: Lovenox Family Communication:   Disposition Plan: TBD   Data Reviewed: Care during the described time interval was provided by me .  I have reviewed this patient's available data, including medical history, events of note, physical examination, and all test results as part of my evaluation.   The patient is critically ill with multiple organ systems failure and requires high complexity decision making for assessment and support, frequent evaluation and titration of therapies, application of advanced monitoring technologies and extensive interpretation of multiple databases. Critical Care Time devoted to patient care services described in this note  Time spent: 70 minutes   Kamylah Manzo, Roselind Messier [facility] Pager [phone]

## 2019-10-15 NOTE — ED Notes (Signed)
Called [facility] and they stated they would come see the pt

## 2019-10-16 DIAGNOSIS — E118 Type 2 diabetes mellitus with unspecified complications: Secondary | ICD-10-CM | POA: Diagnosis present

## 2019-10-16 DIAGNOSIS — IMO0002 Reserved for concepts with insufficient information to code with codable children: Secondary | ICD-10-CM | POA: Diagnosis present

## 2019-10-16 LAB — CBC WITH DIFFERENTIAL/PLATELET
Abs Immature Granulocytes: 0.04 10*3/uL (ref 0.00–0.07)
Basophils Absolute: 0 10*3/uL (ref 0.0–0.1)
Basophils Relative: 0 %
Eosinophils Absolute: 0 10*3/uL (ref 0.0–0.5)
Eosinophils Relative: 0 %
HCT: 41.5 % (ref 39.0–52.0)
Hemoglobin: 13.9 g/dL (ref 13.0–17.0)
Immature Granulocytes: 1 %
Lymphocytes Relative: 13 %
Lymphs Abs: 0.9 10*3/uL (ref 0.7–4.0)
MCH: 31.4 pg (ref 26.0–34.0)
MCHC: 33.5 g/dL (ref 30.0–36.0)
MCV: 93.9 fL (ref 80.0–100.0)
Monocytes Absolute: 0.4 10*3/uL (ref 0.1–1.0)
Monocytes Relative: 5 %
Neutro Abs: 5.6 10*3/uL (ref 1.7–7.7)
Neutrophils Relative %: 81 %
Platelets: 181 10*3/uL (ref 150–400)
RBC: 4.42 MIL/uL (ref 4.22–5.81)
RDW: 13.6 % (ref 11.5–15.5)
WBC: 6.9 10*3/uL (ref 4.0–10.5)
nRBC: 0 % (ref 0.0–0.2)

## 2019-10-16 LAB — PHOSPHORUS: Phosphorus: 3.4 mg/dL (ref 2.5–4.6)

## 2019-10-16 LAB — GLUCOSE, CAPILLARY
Glucose-Capillary: 128 mg/dL — ABNORMAL HIGH (ref 70–99)
Glucose-Capillary: 130 mg/dL — ABNORMAL HIGH (ref 70–99)
Glucose-Capillary: 132 mg/dL — ABNORMAL HIGH (ref 70–99)
Glucose-Capillary: 144 mg/dL — ABNORMAL HIGH (ref 70–99)
Glucose-Capillary: 145 mg/dL — ABNORMAL HIGH (ref 70–99)
Glucose-Capillary: 148 mg/dL — ABNORMAL HIGH (ref 70–99)
Glucose-Capillary: 155 mg/dL — ABNORMAL HIGH (ref 70–99)
Glucose-Capillary: 169 mg/dL — ABNORMAL HIGH (ref 70–99)
Glucose-Capillary: 414 mg/dL — ABNORMAL HIGH (ref 70–99)

## 2019-10-16 LAB — COMPREHENSIVE METABOLIC PANEL
ALT: 30 U/L (ref 0–44)
AST: 31 U/L (ref 15–41)
Albumin: 2.8 g/dL — ABNORMAL LOW (ref 3.5–5.0)
Alkaline Phosphatase: 60 U/L (ref 38–126)
Anion gap: 8 (ref 5–15)
BUN: 24 mg/dL — ABNORMAL HIGH (ref 6–20)
CO2: 27 mmol/L (ref 22–32)
Calcium: 8.7 mg/dL — ABNORMAL LOW (ref 8.9–10.3)
Chloride: 106 mmol/L (ref 98–111)
Creatinine, Ser: 0.55 mg/dL — ABNORMAL LOW (ref 0.61–1.24)
GFR calc Af Amer: >60 mL/min (ref >60)
GFR calc non Af Amer: >60 mL/min (ref >60)
Glucose, Bld: 149 mg/dL — ABNORMAL HIGH (ref 70–99)
Potassium: 4.1 mmol/L (ref 3.5–5.1)
Sodium: 141 mmol/L (ref 135–145)
Total Bilirubin: 0.6 mg/dL (ref 0.3–1.2)
Total Protein: 6.4 g/dL — ABNORMAL LOW (ref 6.5–8.1)

## 2019-10-16 LAB — HEPATITIS B SURFACE ANTIBODY, QUANTITATIVE: Hep B S AB Quant (Post): <3.1 m[IU]/mL — ABNORMAL LOW (ref >9.9)

## 2019-10-16 LAB — D-DIMER, QUANTITATIVE: D-Dimer, Quant: 0.78 ug/mL-FEU — ABNORMAL HIGH (ref 0.00–0.50)

## 2019-10-16 LAB — SARS CORONAVIRUS 2 (TAT 6-24 HRS): SARS Coronavirus 2: POSITIVE — AB

## 2019-10-16 LAB — ABO/RH: ABO/RH(D): A POS

## 2019-10-16 LAB — MAGNESIUM: Magnesium: 2.3 mg/dL (ref 1.7–2.4)

## 2019-10-16 LAB — FERRITIN: Ferritin: 827 ng/mL — ABNORMAL HIGH (ref 24–336)

## 2019-10-16 LAB — C-REACTIVE PROTEIN: CRP: 8.5 mg/dL — ABNORMAL HIGH (ref <1.0)

## 2019-10-16 MED ORDER — INSULIN GLARGINE 100 UNIT/ML ~~LOC~~ SOLN
10.0000 [IU] | Freq: Every day | SUBCUTANEOUS | Status: DC
  Administered 2019-10-16 – 2019-10-17 (×2): 10 [IU] via SUBCUTANEOUS
  Filled 2019-10-16 (×2): qty 0.1

## 2019-10-16 MED ORDER — CHLORHEXIDINE GLUCONATE CLOTH 2 % EX PADS
6.0000 | MEDICATED_PAD | Freq: Every day | CUTANEOUS | Status: DC
  Administered 2019-10-16 – 2019-10-23 (×8): 6 via TOPICAL

## 2019-10-16 MED ORDER — INSULIN ASPART 100 UNIT/ML ~~LOC~~ SOLN
8.0000 [IU] | Freq: Three times a day (TID) | SUBCUTANEOUS | Status: DC
  Administered 2019-10-16 – 2019-10-17 (×2): 8 [IU] via SUBCUTANEOUS

## 2019-10-16 MED ORDER — ORAL CARE MOUTH RINSE
15.0000 mL | Freq: Two times a day (BID) | OROMUCOSAL | Status: DC
  Administered 2019-10-16 – 2019-10-23 (×14): 15 mL via OROMUCOSAL

## 2019-10-16 MED ORDER — INSULIN ASPART 100 UNIT/ML ~~LOC~~ SOLN
0.0000 [IU] | SUBCUTANEOUS | Status: DC
  Administered 2019-10-16 (×3): 20 [IU] via SUBCUTANEOUS
  Administered 2019-10-17: 4 [IU] via SUBCUTANEOUS
  Administered 2019-10-17: 11 [IU] via SUBCUTANEOUS
  Administered 2019-10-17: 20 [IU] via SUBCUTANEOUS
  Administered 2019-10-17 (×3): 15 [IU] via SUBCUTANEOUS
  Administered 2019-10-17 – 2019-10-18 (×2): 20 [IU] via SUBCUTANEOUS
  Administered 2019-10-18: 11 [IU] via SUBCUTANEOUS
  Administered 2019-10-18: 7 [IU] via SUBCUTANEOUS
  Administered 2019-10-18: 08:00:00 4 [IU] via SUBCUTANEOUS
  Administered 2019-10-19: 08:00:00 3 [IU] via SUBCUTANEOUS
  Administered 2019-10-19: 13:00:00 7 [IU] via SUBCUTANEOUS
  Administered 2019-10-19: 17:00:00 20 [IU] via SUBCUTANEOUS
  Administered 2019-10-19: 04:00:00 4 [IU] via SUBCUTANEOUS
  Administered 2019-10-19: 11 [IU] via SUBCUTANEOUS
  Administered 2019-10-20: 04:00:00 7 [IU] via SUBCUTANEOUS
  Administered 2019-10-20: 11 [IU] via SUBCUTANEOUS

## 2019-10-16 NOTE — Progress Notes (Signed)
Patient supine from prone

## 2019-10-16 NOTE — Plan of Care (Signed)
Updated pt on POC, pt verbalized understanding 

## 2019-10-16 NOTE — Progress Notes (Signed)
RN messaged Dr. Vanita Ingles on Amion due to no new orders/ return of message for phase 3 on insulin protocol. No new orders recieved

## 2019-10-16 NOTE — Progress Notes (Signed)
Tranferred pt from 204-3 to 203-3 by bed, all belongings brought with pt, no complications noted during transfer.  Confirmed with CCMD pt is on correct monitor.

## 2019-10-16 NOTE — Progress Notes (Addendum)
PROGRESS NOTE    Steven Holmes  WJX:[id number] DOB: 20-Mar-1961 DOA: 10/15/2019 PCP: [facility]   Brief Narrative:  : 58 y.o.WM PMHx diabetes type 2 controlled with complication, HTN, HLD, OSA/OHS, morbid obesity, complex regional pain syndrome, diagnosed with COVID-19 infection on 10/03/2019   Presented to the emergency room with a several day history of worsening shortness of breath. He denies associated chest.On arrival in the emergency room He was hypoxic with O2 sat in the mid 80s. He was placed on oxygen via nonrebreather continue to have sats around 90. He was subsequently placed on oxygen via high flow nasal cannula.He was afebrile, with respirations of 18 pulse rate of 101. White cell count was normal. As well as hemoglobin. Significant abnormal findings included evaded Covid biomarkers with LDH of 365, D-dimer of 1154 and ferritin of 811. Blood sugar was elevated at 329. In the emergency room he received IV Decadron as well as Solu-Medrol and started on remdesivir  Patient states wife also Covid positive sick but at home has been in and out of hospital multiple times.   Subjective: 12/13 last 24 hours afebrile A/O x4, positive S OB but improved sitting on the edge of the bed on HF [place].  States proned most of the night.    Assessment & Plan:   Active Problems:   HTN (hypertension)   OSA (obstructive sleep apnea)   Diabetes mellitus type 2, controlled, with complications (HCC)   Essential hypertension   Obesity hypoventilation syndrome (HCC)   Obesity, Class III, BMI 40-49.9 (morbid obesity) (HCC)   Complex regional pain syndrome I   Pneumonia due to COVID-19 virus   Hyperosmolar hyperglycemic state (HHS) (HCC)   Diabetes mellitus type 2, uncontrolled, with complications (HCC)   Covid pneumonia/acute respiratory failure with hypoxia COVID-19 Labs  Recent Labs    10/14/19 2023 10/15/19 0640 10/15/19 1100 10/16/19 0310  DDIMER  --    --  1.08* 0.78*  FERRITIN 811* 891* 885* 827*  LDH 365*  --  341*  --   CRP 21.6* 19.5* 19.6* 8.5*    Lab Results  Component Value Date   SARSCOV2NAA POSITIVE (A) 10/15/2019   -Decadron 6 mg daily -Remdesivir per pharmacy protocol -12/12 Actemra x1 dose -Combivent -Flutter valve -Prone 16 hours/day; if cannot tolerate prone 2 to 3 hours per shift -Titrate O2 to maintain SPO2> 88% -Vitamins per Covid protocol  OSA/OHS -See Covid pneumonia  Essential HTN -BP controlled without medication, monitor closely  Diabetes type 2 uncontrolled with complication /Hyperosmolar Hyperglycemia Nonketotic Syndrome -12/12 hemoglobin A1c= 9.3 -Endo tool protocol -12/13 Lantus 10 units; 2 hours after administration discontinue Endo tool protocol -12/13 NovoLog 8 units qac -12/13 resistant SSI -12/13 consult diabetic nutrition; uncontrolled diabetes type 2 -12/13 consult diabetic coordinator uncontrolled diabetes type 2  Complex regional pain syndrome - PRN pain medication  Morbid obesity -See diabetes     DVT prophylaxis: Lovenox Code Status: Full Family Communication: 12/13 spoke with Juliette Alcide (wife) counseled her on plan of care answered all questions Disposition Plan:    Consultants:  TBD  Procedures/Significant Events:  12/12 PCXR;Hypoinflation of the lungs with bibasilar and perihilar opacities concerning for pneumonia.   I have personally reviewed and interpreted all radiology studies and my findings are as above.  VENTILATOR SETTINGS: HFNC 12/13 Flow; 30 L/min FiO2; 100% SPO2 92%     Cultures 12/12 blood RIGHT hand NGTD x2 12/12 influenza A/B negative 12/12 respiratory virus panel negative 12/12 HIV screen negative 12/12  SARS coronavirus positive   Antimicrobials: Anti-infectives (From admission, onward)   Start     Dose/Rate Stop   10/16/19 1000  remdesivir 100 mg in sodium chloride 0.9 % 100 mL IVPB  Status:  Discontinued     100 mg 200  mL/hr over 30 Minutes Daily 10/15/19 1035   10/16/19 1000  remdesivir 100 mg in sodium chloride 0.9 % 100 mL IVPB     100 mg 200 mL/hr over 30 Minutes Daily 10/20/19 0959   10/15/19 1030  remdesivir 200 mg in sodium chloride 0.9% 250 mL IVPB  Status:  Discontinued     200 mg 580 mL/hr over 30 Minutes Once 10/15/19 1037       Devices    LINES / TUBES:      Continuous Infusions: . sodium chloride Stopped (10/15/19 1157)  . dextrose 5 % and 0.45% NaCl Stopped (10/16/19 0915)  . insulin Stopped (10/16/19 0953)  . remdesivir 100 mg in NS 100 mL Stopped (10/16/19 0946)     Objective: Vitals:   10/16/19 1100 10/16/19 1135 10/16/19 1200 10/16/19 1215  BP: 113/85 113/85 127/89   Pulse: 83 77 72 67  Resp: 19 19 (!) 26 20  Temp:    97.9 F (36.6 C)  TempSrc:    Oral  SpO2: 97% 97% 96% 94%  Weight:      Height:        Intake/Output Summary (Last 24 hours) at 10/16/2019 1443 Last data filed at 10/16/2019 1000 Gross per 24 hour  Intake 2504.84 ml  Output 950 ml  Net 1554.84 ml   Filed Weights   10/15/19 1000  Weight: 108.7 kg    Examination:  General: A/O x4, positive acute respiratory distress Eyes: negative scleral hemorrhage, negative anisocoria, negative icterus ENT: Negative Runny nose, negative gingival bleeding, Neck:  Negative scars, masses, torticollis, lymphadenopathy, JVD Lungs: Tachypneic significantly decreased breath sounds diffusely without wheezes or crackles Cardiovascular: Regular rate and rhythm without murmur gallop or rub normal S1 and S2 Abdomen: MORBIDLY OBESE, negative abdominal pain, nondistended, positive soft, bowel sounds, no rebound, no ascites, no appreciable mass Extremities: No significant cyanosis, clubbing, or edema bilateral lower extremities Skin: Negative rashes, lesions, ulcers Psychiatric:  Negative depression, negative anxiety, negative fatigue, negative mania  Central nervous system:  Cranial nerves II through XII intact,  tongue/uvula midline, all extremities muscle strength 5/5, sensation intact throughout, negative dysarthria, negative expressive aphasia, negative receptive aphasia.  .     Data Reviewed: Care during the described time interval was provided by me .  I have reviewed this patient's available data, including medical history, events of note, physical examination, and all test results as part of my evaluation.   CBC: Recent Labs  Lab 10/14/19 2023 10/15/19 0039 10/15/19 0640 10/15/19 1100 10/16/19 0310  WBC 5.5 5.4 4.0 4.5 6.9  NEUTROABS 4.4  --   --  3.8 5.6  HGB 15.2 14.8 13.9 15.2 13.9  HCT 43.6 43.0 41.0 44.1 41.5  MCV 90.6 91.9 92.6 93.2 93.9  PLT 143* 147* 142* 157 960   Basic Metabolic Panel: Recent Labs  Lab 10/14/19 2023 10/15/19 0039 10/15/19 0640 10/15/19 1100 10/16/19 0310  NA 136  --  138 139 141  K 3.9  --  4.7 3.5 4.1  CL 100  --  103 101 106  CO2 24  --  21* 22 27  GLUCOSE 329*  --  432* 247* 149*  BUN 19  --  22* 22* 24*  CREATININE  0.94 0.93 0.75 0.80 0.55*  CALCIUM 8.1*  --  8.0* 8.6* 8.7*  MG  --   --  2.5* 2.3 2.3  PHOS  --   --  3.7 3.1 3.4   GFR: Estimated Creatinine Clearance: 120.3 mL/min (A) (by C-G formula based on SCr of 0.55 mg/dL (L)). Liver Function Tests: Recent Labs  Lab 10/14/19 2023 10/15/19 1100 10/16/19 0310  AST 33 34 31  ALT 32 35 30  ALKPHOS 67 66 60  BILITOT 1.2 0.7 0.6  PROT 7.3 7.2 6.4*  ALBUMIN 3.3* 3.0* 2.8*   No results for input(s): LIPASE, AMYLASE in the last 168 hours. No results for input(s): AMMONIA in the last 168 hours. Coagulation Profile: No results for input(s): INR, PROTIME in the last 168 hours. Cardiac Enzymes: No results for input(s): CKTOTAL, CKMB, CKMBINDEX, TROPONINI in the last 168 hours. BNP (last 3 results) No results for input(s): PROBNP in the last 8760 hours. HbA1C: Recent Labs    10/15/19 1100  HGBA1C 9.3*   CBG: Recent Labs  Lab 10/16/19 0244 10/16/19 0328 10/16/19 0433  10/16/19 0528 10/16/19 0739  GLUCAP 128* 169* 148* 155* 132*   Lipid Profile: Recent Labs    10/15/19 1100  CHOL 171  HDL 31*  LDLCALC 118*  TRIG 110  CHOLHDL 5.5   Thyroid Function Tests: No results for input(s): TSH, T4TOTAL, FREET4, T3FREE, THYROIDAB in the last 72 hours. Anemia Panel: Recent Labs    10/15/19 1100 10/16/19 0310  FERRITIN 885* 827*   Urine analysis:    Component Value Date/Time   COLORURINE YELLOW 09/06/2013 2047   APPEARANCEUR CLEAR 09/06/2013 2047   LABSPEC 1.024 09/06/2013 2047   PHURINE 5.0 09/06/2013 2047   GLUCOSEU NEGATIVE 09/06/2013 2047   HGBUR NEGATIVE 09/06/2013 2047   BILIRUBINUR NEGATIVE 09/06/2013 2047   KETONESUR NEGATIVE 09/06/2013 2047   PROTEINUR NEGATIVE 09/06/2013 2047   UROBILINOGEN 0.2 09/06/2013 2047   NITRITE NEGATIVE 09/06/2013 2047   LEUKOCYTESUR NEGATIVE 09/06/2013 2047   Sepsis Labs: (procalcitonin:4,lacticidven:4)  ) Recent Results (from the past 240 hour(s))  Culture, blood (routine x 2)     Status: None (Preliminary result)   Collection Time: 10/15/19 12:40 AM   Specimen: BLOOD RIGHT HAND  Result Value Ref Range Status   Specimen Description BLOOD RIGHT HAND  Final   Special Requests   Final    BOTTLES DRAWN AEROBIC AND ANAEROBIC Blood Culture adequate volume   Culture   Final    NO GROWTH 1 DAY Performed at [facility], [address], [place], [place] [address]    Report Status PENDING  Incomplete  Culture, blood (routine x 2)     Status: None (Preliminary result)   Collection Time: 10/15/19 12:40 AM   Specimen: BLOOD RIGHT HAND  Result Value Ref Range Status   Specimen Description BLOOD RIGHT HAND  Final   Special Requests   Final    BOTTLES DRAWN AEROBIC AND ANAEROBIC Blood Culture adequate volume   Culture   Final    NO GROWTH 1 DAY Performed at [facility], [address] [place], [place] [address]    Report Status PENDING  Incomplete  Respiratory Panel by PCR      Status: None   Collection Time: 10/15/19 12:40 AM   Specimen: Nasopharyngeal Swab; Respiratory  Result Value Ref Range Status   Adenovirus NOT DETECTED NOT DETECTED Final   Coronavirus 229E NOT DETECTED NOT DETECTED Final    Comment: (NOTE) The Coronavirus on the  Respiratory Panel, DOES NOT test for the novel  Coronavirus (2019 nCoV)    Coronavirus HKU1 NOT DETECTED NOT DETECTED Final   Coronavirus NL63 NOT DETECTED NOT DETECTED Final   Coronavirus OC43 NOT DETECTED NOT DETECTED Final   Metapneumovirus NOT DETECTED NOT DETECTED Final   Rhinovirus / Enterovirus NOT DETECTED NOT DETECTED Final   Influenza A NOT DETECTED NOT DETECTED Final   Influenza B NOT DETECTED NOT DETECTED Final   Parainfluenza Virus 1 NOT DETECTED NOT DETECTED Final   Parainfluenza Virus 2 NOT DETECTED NOT DETECTED Final   Parainfluenza Virus 3 NOT DETECTED NOT DETECTED Final   Parainfluenza Virus 4 NOT DETECTED NOT DETECTED Final   Respiratory Syncytial Virus NOT DETECTED NOT DETECTED Final   Bordetella pertussis NOT DETECTED NOT DETECTED Final   Chlamydophila pneumoniae NOT DETECTED NOT DETECTED Final   Mycoplasma pneumoniae NOT DETECTED NOT DETECTED Final    Comment: Performed at [facility], [address] [place], [place] [address]  Respiratory Panel by PCR     Status: None   Collection Time: 10/15/19  3:15 PM  Result Value Ref Range Status   Adenovirus NOT DETECTED NOT DETECTED Final   Coronavirus 229E NOT DETECTED NOT DETECTED Final    Comment: (NOTE) The Coronavirus on the Respiratory Panel, DOES NOT test for the novel  Coronavirus (2019 nCoV)    Coronavirus HKU1 NOT DETECTED NOT DETECTED Final   Coronavirus NL63 NOT DETECTED NOT DETECTED Final   Coronavirus OC43 NOT DETECTED NOT DETECTED Final   Metapneumovirus NOT DETECTED NOT DETECTED Final   Rhinovirus / Enterovirus NOT DETECTED NOT DETECTED Final   Influenza A NOT DETECTED NOT DETECTED Final   Influenza B NOT DETECTED NOT DETECTED  Final   Parainfluenza Virus 1 NOT DETECTED NOT DETECTED Final   Parainfluenza Virus 2 NOT DETECTED NOT DETECTED Final   Parainfluenza Virus 3 NOT DETECTED NOT DETECTED Final   Parainfluenza Virus 4 NOT DETECTED NOT DETECTED Final   Respiratory Syncytial Virus NOT DETECTED NOT DETECTED Final   Bordetella pertussis NOT DETECTED NOT DETECTED Final   Chlamydophila pneumoniae NOT DETECTED NOT DETECTED Final   Mycoplasma pneumoniae NOT DETECTED NOT DETECTED Final    Comment: Performed at [facility], [address], [place], [place] [address]  MRSA PCR Screening     Status: None   Collection Time: 10/15/19  3:35 PM  Result Value Ref Range Status   MRSA by PCR NEGATIVE NEGATIVE Final    Comment:        The GeneXpert MRSA Assay (FDA approved for NASAL specimens only), is one component of a comprehensive MRSA colonization surveillance program. It is not intended to diagnose MRSA infection nor to guide or monitor treatment for MRSA infections. Performed at [facility], [address] [place], [place] [address]   SARS CORONAVIRUS 2 (TAT 6-24 HRS) Nasopharyngeal Nasopharyngeal Swab     Status: Abnormal   Collection Time: 10/15/19  4:30 PM   Specimen: Nasopharyngeal Swab  Result Value Ref Range Status   SARS Coronavirus 2 POSITIVE (A) NEGATIVE Final    Comment: RESULT CALLED TO, READ BACK BY AND VERIFIED WITH: MORGAN SCHDIMT RN.@[email or web address]  ON 12.13.2020 BY TCALDWELL MT. (NOTE) SARS-CoV-2 target nucleic acids are DETECTED. The SARS-CoV-2 RNA is generally detectable in upper and lower respiratory specimens during the acute phase of infection. Positive results are indicative of the presence of SARS-CoV-2 RNA. Clinical correlation with patient history and other diagnostic information is  necessary to determine  patient infection status. Positive results do not rule out bacterial infection or co-infection with other viruses.  The expected result is Negative. Fact Sheet  for Patients: HairSlick.nohttps://www.fda.gov/media/138098/download Fact Sheet for Healthcare Providers: quierodirigir.comhttps://www.fda.gov/media/138095/download This test is not yet approved or cleared by the [place] FDA and  has been authorized for detection and/or diagnosis of SARS-CoV-2 by FDA under an Emergency Use Authorization (EUA). This EUA will remain  in effect (meaning this test c an be used) for the duration of the COVID-19 declaration under Section 564(b)(1) of the Act, 21 U.S.C. section 360bbb-3(b)(1), unless the authorization is terminated or revoked sooner. Performed at [facility], [address] [place], [place] [address]          Radiology Studies: DG Chest 2 View  Result Date: 10/14/2019 CLINICAL DATA:  Shortness of breath, cough, COVID-19 positive EXAM: CHEST - 2 VIEW COMPARISON:  10/08/2019 FINDINGS: Heart size is mildly enlarged, unchanged. Interval development of diffuse patchy airspace opacities throughout both lungs, slightly more confluent within the lung bases. No pleural effusion or pneumothorax IMPRESSION: Interval development of diffuse patchy airspace opacities throughout both lungs, slightly more confluent within the lung bases, suspicious for multifocal atypical/viral pneumonia. Electronically Signed   By: Duanne GuessNicholas  Plundo M.D.   On: 10/14/2019 17:43   Portable chest 1 View  Result Date: 10/15/2019 CLINICAL DATA:  Shortness of breath. EXAM: PORTABLE CHEST 1 VIEW COMPARISON:  October 14, 2019. FINDINGS: Stable cardiomegaly. No pneumothorax is noted. Hypoinflation of the lungs is noted with bibasilar and perihilar opacities concerning for pneumonia. Bony thorax is unremarkable. IMPRESSION: Hypoinflation of the lungs with bibasilar and perihilar opacities concerning for pneumonia. Electronically Signed   By: Lupita RaiderJames  Green Jr M.D.   On: 10/15/2019 12:55        Scheduled Meds: . aspirin EC  81 mg Oral Daily  . Chlorhexidine Gluconate Cloth  6 each Topical Daily   . dexamethasone (DECADRON) injection  6 mg Intravenous Q24H  . enoxaparin (LOVENOX) injection  50 mg Subcutaneous Q12H  . folic acid  1 mg Intravenous Daily  . insulin aspart  0-20 Units Subcutaneous Q4H  . insulin glargine  10 Units Subcutaneous Daily  . Ipratropium-Albuterol  1 puff Inhalation Q6H  . mouth rinse  15 mL Mouth Rinse BID  . thiamine injection  100 mg Intravenous Daily  . vitamin C  500 mg Oral Daily  . zinc sulfate  220 mg Oral Daily   Continuous Infusions: . sodium chloride Stopped (10/15/19 1157)  . dextrose 5 % and 0.45% NaCl Stopped (10/16/19 0915)  . insulin Stopped (10/16/19 0953)  . remdesivir 100 mg in NS 100 mL Stopped (10/16/19 0946)     LOS: 1 day   The patient is critically ill with multiple organ systems failure and requires high complexity decision making for assessment and support, frequent evaluation and titration of therapies, application of advanced monitoring technologies and extensive interpretation of multiple databases. Critical Care Time devoted to patient care services described in this note  Time spent: 40 minutes     WOODS, Roselind MessierURTIS J, MD [facility] Pager [phone]  If 7PM-7AM, please contact night-coverage [email or web address] Password [email or web address] 10/16/2019, 2:43 PM

## 2019-10-16 NOTE — Progress Notes (Signed)
Patient proned, tolerated well, NRB removed

## 2019-10-16 NOTE — Progress Notes (Signed)
RN messaged Dr. Vanita Ingles on Amion due to endotool stating to initiate phase 3 on insulin protocol. No new orders received.

## 2019-10-16 NOTE — Progress Notes (Addendum)
Paged Dr. Vanita Ingles. No reply for phase 3 on insulin gtt., RN will monitor patient and continue checking BG Q1 hours

## 2019-10-17 LAB — GLUCOSE, CAPILLARY
Glucose-Capillary: 138 mg/dL — ABNORMAL HIGH (ref 70–99)
Glucose-Capillary: 183 mg/dL — ABNORMAL HIGH (ref 70–99)
Glucose-Capillary: 249 mg/dL — ABNORMAL HIGH (ref 70–99)
Glucose-Capillary: 287 mg/dL — ABNORMAL HIGH (ref 70–99)
Glucose-Capillary: 318 mg/dL — ABNORMAL HIGH (ref 70–99)
Glucose-Capillary: 323 mg/dL — ABNORMAL HIGH (ref 70–99)
Glucose-Capillary: 329 mg/dL — ABNORMAL HIGH (ref 70–99)
Glucose-Capillary: 372 mg/dL — ABNORMAL HIGH (ref 70–99)
Glucose-Capillary: 373 mg/dL — ABNORMAL HIGH (ref 70–99)
Glucose-Capillary: 379 mg/dL — ABNORMAL HIGH (ref 70–99)
Glucose-Capillary: 412 mg/dL — ABNORMAL HIGH (ref 70–99)

## 2019-10-17 LAB — CBC WITH DIFFERENTIAL/PLATELET
Abs Immature Granulocytes: 0.05 10*3/uL (ref 0.00–0.07)
Basophils Absolute: 0 10*3/uL (ref 0.0–0.1)
Basophils Relative: 0 %
Eosinophils Absolute: 0.2 10*3/uL (ref 0.0–0.5)
Eosinophils Relative: 3 %
HCT: 41.7 % (ref 39.0–52.0)
Hemoglobin: 14 g/dL (ref 13.0–17.0)
Immature Granulocytes: 1 %
Lymphocytes Relative: 13 %
Lymphs Abs: 0.8 10*3/uL (ref 0.7–4.0)
MCH: 31.6 pg (ref 26.0–34.0)
MCHC: 33.6 g/dL (ref 30.0–36.0)
MCV: 94.1 fL (ref 80.0–100.0)
Monocytes Absolute: 0.4 10*3/uL (ref 0.1–1.0)
Monocytes Relative: 6 %
Neutro Abs: 4.8 10*3/uL (ref 1.7–7.7)
Neutrophils Relative %: 77 %
Platelets: 196 10*3/uL (ref 150–400)
RBC: 4.43 MIL/uL (ref 4.22–5.81)
RDW: 13.5 % (ref 11.5–15.5)
WBC: 6.2 10*3/uL (ref 4.0–10.5)
nRBC: 0 % (ref 0.0–0.2)

## 2019-10-17 LAB — PHOSPHORUS: Phosphorus: 4.2 mg/dL (ref 2.5–4.6)

## 2019-10-17 LAB — C-REACTIVE PROTEIN: CRP: 3.2 mg/dL — ABNORMAL HIGH (ref <1.0)

## 2019-10-17 LAB — COMPREHENSIVE METABOLIC PANEL
ALT: 29 U/L (ref 0–44)
AST: 20 U/L (ref 15–41)
Albumin: 2.7 g/dL — ABNORMAL LOW (ref 3.5–5.0)
Alkaline Phosphatase: 64 U/L (ref 38–126)
Anion gap: 11 (ref 5–15)
BUN: 28 mg/dL — ABNORMAL HIGH (ref 6–20)
CO2: 28 mmol/L (ref 22–32)
Calcium: 8.9 mg/dL (ref 8.9–10.3)
Chloride: 104 mmol/L (ref 98–111)
Creatinine, Ser: 0.84 mg/dL (ref 0.61–1.24)
GFR calc Af Amer: >60 mL/min (ref >60)
GFR calc non Af Amer: >60 mL/min (ref >60)
Glucose, Bld: 215 mg/dL — ABNORMAL HIGH (ref 70–99)
Potassium: 4 mmol/L (ref 3.5–5.1)
Sodium: 143 mmol/L (ref 135–145)
Total Bilirubin: 0.6 mg/dL (ref 0.3–1.2)
Total Protein: 6.3 g/dL — ABNORMAL LOW (ref 6.5–8.1)

## 2019-10-17 LAB — MAGNESIUM: Magnesium: 2.2 mg/dL (ref 1.7–2.4)

## 2019-10-17 LAB — D-DIMER, QUANTITATIVE: D-Dimer, Quant: 0.51 ug/mL-FEU — ABNORMAL HIGH (ref 0.00–0.50)

## 2019-10-17 LAB — FERRITIN: Ferritin: 671 ng/mL — ABNORMAL HIGH (ref 24–336)

## 2019-10-17 MED ORDER — INSULIN GLARGINE 100 UNIT/ML ~~LOC~~ SOLN
15.0000 [IU] | Freq: Every day | SUBCUTANEOUS | Status: DC
  Administered 2019-10-18 – 2019-10-19 (×2): 15 [IU] via SUBCUTANEOUS
  Filled 2019-10-17 (×3): qty 0.15

## 2019-10-17 MED ORDER — PRO-STAT SUGAR FREE PO LIQD
30.0000 mL | Freq: Two times a day (BID) | ORAL | Status: DC
  Administered 2019-10-17 – 2019-10-23 (×13): 30 mL via ORAL
  Filled 2019-10-17 (×13): qty 30

## 2019-10-17 MED ORDER — INSULIN ASPART 100 UNIT/ML ~~LOC~~ SOLN
10.0000 [IU] | Freq: Three times a day (TID) | SUBCUTANEOUS | Status: DC
  Administered 2019-10-17 – 2019-10-19 (×8): 10 [IU] via SUBCUTANEOUS

## 2019-10-17 NOTE — Progress Notes (Signed)
PROGRESS NOTE    Steven Holmes  UUV:[id number] DOB: [birth year] DOA: 10/15/2019 PCP: [facility]   Brief Narrative:  : 58 y.o.WM PMHx diabetes type 2 controlled with complication, HTN, HLD, OSA/OHS, morbid obesity, complex regional pain syndrome, diagnosed with COVID-19 infection on 10/03/2019   Presented to the emergency room with a several day history of worsening shortness of breath. He denies associated chest.On arrival in the emergency room He was hypoxic with O2 sat in the mid 80s. He was placed on oxygen via nonrebreather continue to have sats around 90. He was subsequently placed on oxygen via high flow nasal cannula.He was afebrile, with respirations of 18 pulse rate of 101. White cell count was normal. As well as hemoglobin. Significant abnormal findings included evaded Covid biomarkers with LDH of 365, D-dimer of 1154 and ferritin of 811. Blood sugar was elevated at 329. In the emergency room he received IV Decadron as well as Solu-Medrol and started on remdesivir  Patient states wife also Covid positive sick but at home has been in and out of hospital multiple times.   Subjective: 12/14 last 24 hours afebrile A/O x4, positive S OB, but resting comfortably in bed, in supine position.     Assessment & Plan:   Active Problems:   HTN (hypertension)   OSA (obstructive sleep apnea)   Diabetes mellitus type 2, controlled, with complications (HCC)   Essential hypertension   Obesity hypoventilation syndrome (HCC)   Obesity, Class III, BMI 40-49.9 (morbid obesity) (HCC)   Complex regional pain syndrome I   Pneumonia due to COVID-19 virus   Hyperosmolar hyperglycemic state (HHS) (HCC)   Diabetes mellitus type 2, uncontrolled, with complications (HCC)   Covid pneumonia/acute respiratory failure with hypoxia COVID-19 Labs  Recent Labs    10/14/19 2023 10/15/19 0640 10/15/19 1100 10/16/19 0310 10/17/19 0643  DDIMER  --   --  1.08* 0.78*  0.51*  FERRITIN 811* 891* 885* 827*  --   LDH 365*  --  341*  --   --   CRP 21.6* 19.5* 19.6* 8.5*  --     Lab Results  Component Value Date   SARSCOV2NAA POSITIVE (A) 10/15/2019   -Decadron 6 mg daily -Remdesivir per pharmacy protocol -12/12 Actemra x1 dose -Combivent QID -Flutter valve -Prone 16 hours/day; if cannot tolerate prone 2 to 3 hours per shift -Titrate O2 to maintain SPO2> 88% -Vitamins per Covid protocol  OSA/OHS -See Covid pneumonia  Essential HTN -BP controlled without medication, monitor closely  Diabetes type 2 uncontrolled with complication /Hyperosmolar Hyperglycemia Nonketotic Syndrome -12/12 hemoglobin A1c= 9.3 -Endo tool protocol -12/13 Lantus 10 units; 2 hours after administration discontinue Endo tool protocol -12/13 consult diabetic nutrition; uncontrolled diabetes type 2 -12/13 consult diabetic coordinator uncontrolled diabetes type 2 -12/14 increase Lantus 15 units daily -12/14 increase NovoLog 10 units qac -Resistant SSI   Complex regional pain syndrome - PRN pain medication  Morbid obesity -See diabetes     DVT prophylaxis: Lovenox Code Status: Full Family Communication: 12/13 spoke with Juliette Alcide (wife) counseled her on plan of care answered all questions Disposition Plan:    Consultants:  TBD  Procedures/Significant Events:  12/12 PCXR;Hypoinflation of the lungs with bibasilar and perihilar opacities concerning for pneumonia.   I have personally reviewed and interpreted all radiology studies and my findings are as above.  VENTILATOR SETTINGS: HFNC 12/14 Flow; 25 L/min FiO2; 70% SPO2 86%     Cultures 12/12 blood RIGHT hand NGTD x2 12/12 influenza A/B negative  12/12 respiratory virus panel negative 12/12 HIV screen negative 12/12 SARS coronavirus positive   Antimicrobials: Anti-infectives (From admission, onward)   Start     Dose/Rate Stop   10/16/19 1000  remdesivir 100 mg in sodium chloride 0.9 % 100  mL IVPB  Status:  Discontinued     100 mg 200 mL/hr over 30 Minutes Daily 10/15/19 1035   10/16/19 1000  remdesivir 100 mg in sodium chloride 0.9 % 100 mL IVPB     100 mg 200 mL/hr over 30 Minutes Daily 10/20/19 0959   10/15/19 1030  remdesivir 200 mg in sodium chloride 0.9% 250 mL IVPB  Status:  Discontinued     200 mg 580 mL/hr over 30 Minutes Once 10/15/19 1037       Devices    LINES / TUBES:      Continuous Infusions: . sodium chloride Stopped (10/15/19 1157)  . insulin Stopped (10/16/19 0953)  . remdesivir 100 mg in NS 100 mL 100 mg (10/17/19 0814)     Objective: Vitals:   10/17/19 0400 10/17/19 0500 10/17/19 0700 10/17/19 0800  BP: 114/84 126/89 137/82   Pulse: 77 71 88 69  Resp: 15 11 (!) 29 (!) 24  Temp: 98 F (36.7 C)     TempSrc: Oral     SpO2: (!) 89% 91% (!) 89% (!) 86%  Weight:      Height:        Intake/Output Summary (Last 24 hours) at 10/17/2019 [id number] Last data filed at 10/16/2019 2300 Gross per 24 hour  Intake 1116.47 ml  Output 2060 ml  Net -943.53 ml   Filed Weights   10/15/19 1000  Weight: 108.7 kg   Physical Exam:  General: A/O x4, positive acute respiratory distress Eyes: negative scleral hemorrhage, negative anisocoria, negative icterus ENT: Negative Runny nose, negative gingival bleeding, Neck:  Negative scars, masses, torticollis, lymphadenopathy, JVD Lungs: Tachypneic, clear to auscultation bilaterally without wheezes or crackles Cardiovascular: Regular rate and rhythm without murmur gallop or rub normal S1 and S2 Abdomen: MORBIDLY OBESE, negative abdominal pain, nondistended, positive soft, bowel sounds, no rebound, no ascites, no appreciable mass Extremities: No significant cyanosis, clubbing, or edema bilateral lower extremities Skin: Negative rashes, lesions, ulcers Psychiatric:  Negative depression, negative anxiety, negative fatigue, negative mania  Central nervous system:  Cranial nerves II through XII intact,  tongue/uvula midline, all extremities muscle strength 5/5, sensation intact throughout, finger nose finger bilateral within normal limits, quick finger touch bilateral within normal limits, negative Romberg sign, heel to shin bilateral within normal limits, standing on 1 foot bilateral within normal limits, walking on tiptoes within normal limits, walking on heels within normal limits, negative dysarthria, negative expressive aphasia, negative receptive aphasia.  .     Data Reviewed: Care during the described time interval was provided by me .  I have reviewed this patient's available data, including medical history, events of note, physical examination, and all test results as part of my evaluation.   CBC: Recent Labs  Lab 10/14/19 2023 10/15/19 0039 10/15/19 0640 10/15/19 1100 10/16/19 0310 10/17/19 0643  WBC 5.5 5.4 4.0 4.5 6.9 6.2  NEUTROABS 4.4  --   --  3.8 5.6 PENDING  HGB 15.2 14.8 13.9 15.2 13.9 14.0  HCT 43.6 43.0 41.0 44.1 41.5 41.7  MCV 90.6 91.9 92.6 93.2 93.9 94.1  PLT 143* 147* 142* 157 181 196   Basic Metabolic Panel: Recent Labs  Lab 10/14/19 2023 10/15/19 0039 10/15/19 0640 10/15/19 1100 10/16/19 0310  NA 136  --  138 139 141  K 3.9  --  4.7 3.5 4.1  CL 100  --  103 101 106  CO2 24  --  21* 22 27  GLUCOSE 329*  --  432* 247* 149*  BUN 19  --  22* 22* 24*  CREATININE 0.94 0.93 0.75 0.80 0.55*  CALCIUM 8.1*  --  8.0* 8.6* 8.7*  MG  --   --  2.5* 2.3 2.3  PHOS  --   --  3.7 3.1 3.4   GFR: Estimated Creatinine Clearance: 120.3 mL/min (A) (by C-G formula based on SCr of 0.55 mg/dL (L)). Liver Function Tests: Recent Labs  Lab 10/14/19 2023 10/15/19 1100 10/16/19 0310  AST 33 34 31  ALT 32 35 30  ALKPHOS 67 66 60  BILITOT 1.2 0.7 0.6  PROT 7.3 7.2 6.4*  ALBUMIN 3.3* 3.0* 2.8*   No results for input(s): LIPASE, AMYLASE in the last 168 hours. No results for input(s): AMMONIA in the last 168 hours. Coagulation Profile: No results for input(s): INR,  PROTIME in the last 168 hours. Cardiac Enzymes: No results for input(s): CKTOTAL, CKMB, CKMBINDEX, TROPONINI in the last 168 hours. BNP (last 3 results) No results for input(s): PROBNP in the last 8760 hours. HbA1C: Recent Labs    10/15/19 1100  HGBA1C 9.3*   CBG: Recent Labs  Lab 10/16/19 0739 10/16/19 1530 10/17/19 0037 10/17/19 0426 10/17/19 0757  GLUCAP 132* 414* 323* 287* 183*   Lipid Profile: Recent Labs    10/15/19 1100  CHOL 171  HDL 31*  LDLCALC 118*  TRIG 110  CHOLHDL 5.5   Thyroid Function Tests: No results for input(s): TSH, T4TOTAL, FREET4, T3FREE, THYROIDAB in the last 72 hours. Anemia Panel: Recent Labs    10/15/19 1100 10/16/19 0310  FERRITIN 885* 827*   Urine analysis:    Component Value Date/Time   COLORURINE YELLOW 09/06/2013 2047   APPEARANCEUR CLEAR 09/06/2013 2047   LABSPEC 1.024 09/06/2013 2047   PHURINE 5.0 09/06/2013 2047   GLUCOSEU NEGATIVE 09/06/2013 2047   HGBUR NEGATIVE 09/06/2013 2047   BILIRUBINUR NEGATIVE 09/06/2013 2047   KETONESUR NEGATIVE 09/06/2013 2047   PROTEINUR NEGATIVE 09/06/2013 2047   UROBILINOGEN 0.2 09/06/2013 2047   NITRITE NEGATIVE 09/06/2013 2047   LEUKOCYTESUR NEGATIVE 09/06/2013 2047   Sepsis Labs: @LABRCNTIP (procalcitonin:4,lacticidven:4)  ) Recent Results (from the past 240 hour(s))  Culture, blood (routine x 2)     Status: None (Preliminary result)   Collection Time: 10/15/19 12:40 AM   Specimen: BLOOD RIGHT HAND  Result Value Ref Range Status   Specimen Description BLOOD RIGHT HAND  Final   Special Requests   Final    BOTTLES DRAWN AEROBIC AND ANAEROBIC Blood Culture adequate volume   Culture   Final    NO GROWTH 2 DAYS Performed at [facility], [address], [place], [place] [address]    Report Status PENDING  Incomplete  Culture, blood (routine x 2)     Status: None (Preliminary result)   Collection Time: 10/15/19 12:40 AM   Specimen: BLOOD RIGHT HAND  Result Value Ref  Range Status   Specimen Description BLOOD RIGHT HAND  Final   Special Requests   Final    BOTTLES DRAWN AEROBIC AND ANAEROBIC Blood Culture adequate volume   Culture   Final    NO GROWTH 2 DAYS Performed at [facility], [address] [place], [place] [address]    Report Status PENDING  Incomplete  Respiratory Panel by PCR     Status: None   Collection Time: 10/15/19 12:40 AM   Specimen: Nasopharyngeal Swab; Respiratory  Result Value Ref Range Status   Adenovirus NOT DETECTED NOT DETECTED Final   Coronavirus 229E NOT DETECTED NOT DETECTED Final    Comment: (NOTE) The Coronavirus on the Respiratory Panel, DOES NOT test for the novel  Coronavirus (2019 nCoV)    Coronavirus HKU1 NOT DETECTED NOT DETECTED Final   Coronavirus NL63 NOT DETECTED NOT DETECTED Final   Coronavirus OC43 NOT DETECTED NOT DETECTED Final   Metapneumovirus NOT DETECTED NOT DETECTED Final   Rhinovirus / Enterovirus NOT DETECTED NOT DETECTED Final   Influenza A NOT DETECTED NOT DETECTED Final   Influenza B NOT DETECTED NOT DETECTED Final   Parainfluenza Virus 1 NOT DETECTED NOT DETECTED Final   Parainfluenza Virus 2 NOT DETECTED NOT DETECTED Final   Parainfluenza Virus 3 NOT DETECTED NOT DETECTED Final   Parainfluenza Virus 4 NOT DETECTED NOT DETECTED Final   Respiratory Syncytial Virus NOT DETECTED NOT DETECTED Final   Bordetella pertussis NOT DETECTED NOT DETECTED Final   Chlamydophila pneumoniae NOT DETECTED NOT DETECTED Final   Mycoplasma pneumoniae NOT DETECTED NOT DETECTED Final    Comment: Performed at [facility], [address], [place], [place] [address]  Respiratory Panel by PCR     Status: None   Collection Time: 10/15/19  3:15 PM  Result Value Ref Range Status   Adenovirus NOT DETECTED NOT DETECTED Final   Coronavirus 229E NOT DETECTED NOT DETECTED Final    Comment: (NOTE) The Coronavirus on the Respiratory Panel, DOES NOT test for the novel  Coronavirus (2019 nCoV)     Coronavirus HKU1 NOT DETECTED NOT DETECTED Final   Coronavirus NL63 NOT DETECTED NOT DETECTED Final   Coronavirus OC43 NOT DETECTED NOT DETECTED Final   Metapneumovirus NOT DETECTED NOT DETECTED Final   Rhinovirus / Enterovirus NOT DETECTED NOT DETECTED Final   Influenza A NOT DETECTED NOT DETECTED Final   Influenza B NOT DETECTED NOT DETECTED Final   Parainfluenza Virus 1 NOT DETECTED NOT DETECTED Final   Parainfluenza Virus 2 NOT DETECTED NOT DETECTED Final   Parainfluenza Virus 3 NOT DETECTED NOT DETECTED Final   Parainfluenza Virus 4 NOT DETECTED NOT DETECTED Final   Respiratory Syncytial Virus NOT DETECTED NOT DETECTED Final   Bordetella pertussis NOT DETECTED NOT DETECTED Final   Chlamydophila pneumoniae NOT DETECTED NOT DETECTED Final   Mycoplasma pneumoniae NOT DETECTED NOT DETECTED Final    Comment: Performed at [facility], [address], [place], [place] [address]  MRSA PCR Screening     Status: None   Collection Time: 10/15/19  3:35 PM  Result Value Ref Range Status   MRSA by PCR NEGATIVE NEGATIVE Final    Comment:        The GeneXpert MRSA Assay (FDA approved for NASAL specimens only), is one component of a comprehensive MRSA colonization surveillance program. It is not intended to diagnose MRSA infection nor to guide or monitor treatment for MRSA infections. Performed at [facility], [address] [place], [place] [address]   SARS CORONAVIRUS 2 (TAT 6-24 HRS) Nasopharyngeal Nasopharyngeal Swab     Status: Abnormal   Collection Time: 10/15/19  4:30 PM   Specimen: Nasopharyngeal Swab  Result Value Ref Range Status   SARS Coronavirus 2 POSITIVE (A) NEGATIVE Final    Comment: RESULT CALLED TO, READ BACK BY AND VERIFIED WITH: [facility]  [email or web address]  ON 12.13.2020 BY TCALDWELL MT. (NOTE) SARS-CoV-2 target nucleic acids are DETECTED. The SARS-CoV-2 RNA is generally detectable in upper and lower respiratory specimens during the acute phase  of infection. Positive results are indicative of the presence of SARS-CoV-2 RNA. Clinical correlation with patient history and other diagnostic information is  necessary to determine patient infection status. Positive results do not rule out bacterial infection or co-infection with other viruses.  The expected result is Negative. Fact Sheet for Patients: HairSlick.no Fact Sheet for Healthcare Providers: quierodirigir.com This test is not yet approved or cleared by the [place] FDA and  has been authorized for detection and/or diagnosis of SARS-CoV-2 by FDA under an Emergency Use Authorization (EUA). This EUA will remain  in effect (meaning this test c an be used) for the duration of the COVID-19 declaration under Section 564(b)(1) of the Act, 21 U.S.C. section 360bbb-3(b)(1), unless the authorization is terminated or revoked sooner. Performed at [facility], [address], [place], [place] [address]          Radiology Studies: Portable chest 1 View  Result Date: 10/15/2019 CLINICAL DATA:  Shortness of breath. EXAM: PORTABLE CHEST 1 VIEW COMPARISON:  October 14, 2019. FINDINGS: Stable cardiomegaly. No pneumothorax is noted. Hypoinflation of the lungs is noted with bibasilar and perihilar opacities concerning for pneumonia. Bony thorax is unremarkable. IMPRESSION: Hypoinflation of the lungs with bibasilar and perihilar opacities concerning for pneumonia. Electronically Signed   By: Lupita Raider M.D.   On: 10/15/2019 12:55        Scheduled Meds: . aspirin EC  81 mg Oral Daily  . Chlorhexidine Gluconate Cloth  6 each Topical Daily  . dexamethasone (DECADRON) injection  6 mg Intravenous Q24H  . enoxaparin (LOVENOX) injection  50 mg Subcutaneous Q12H  . folic acid  1 mg Intravenous Daily  . insulin aspart  0-20 Units Subcutaneous Q4H  . insulin aspart  8 Units Subcutaneous TID WC  . insulin glargine  10 Units  Subcutaneous Daily  . Ipratropium-Albuterol  1 puff Inhalation Q6H  . mouth rinse  15 mL Mouth Rinse BID  . thiamine injection  100 mg Intravenous Daily  . vitamin C  500 mg Oral Daily  . zinc sulfate  220 mg Oral Daily   Continuous Infusions: . sodium chloride Stopped (10/15/19 1157)  . insulin Stopped (10/16/19 0953)  . remdesivir 100 mg in NS 100 mL 100 mg (10/17/19 0814)     LOS: 2 days   The patient is critically ill with multiple organ systems failure and requires high complexity decision making for assessment and support, frequent evaluation and titration of therapies, application of advanced monitoring technologies and extensive interpretation of multiple databases. Critical Care Time devoted to patient care services described in this note  Time spent: 40 minutes     Quanta Roher, Roselind Messier, MD [facility] Pager [phone]  If 7PM-7AM, please contact night-coverage [email or web address] Password [email or web address] 10/17/2019, 8:14 AM

## 2019-10-17 NOTE — Progress Notes (Signed)
   Inpatient Diabetes Program Recommendations  AACE/ADA: New Consensus Statement on Inpatient Glycemic Control (2015)  Target Ranges:  Prepandial:   less than 140 mg/dL      Peak postprandial:   less than 180 mg/dL (1-2 hours)      Critically ill patients:  140 - 180 mg/dL   Lab Results  Component Value Date   GLUCAP 183 (H) 10/17/2019   HGBA1C 9.3 (H) 10/15/2019    Review of Glycemic Control  Diabetes history: DM2 Outpatient Diabetes medications: glipizide 20 mg bid, metformin 1000 mg bid Current orders for Inpatient glycemic control: Lantus 15 units QAM, Novolog 0-20 units Q4H + 10 units tidwc  Inpatient Diabetes Program Recommendations:     Increase Lantus to 12 units bid Continue Novolog 0-20 units Q4H. Will need meal coverage insulin - Novolog 10 units tidwc.  Will discuss HgbA1C of 9.3% with pt when appropriate. May need to go home on insulin.  Will follow.  Thank you. Lorenda Peck, RD, LDN, CDE Inpatient Diabetes Coordinator [phone]

## 2019-10-17 NOTE — Progress Notes (Signed)
 Initial Nutrition Assessment  DOCUMENTATION CODES:   Obesity unspecified  INTERVENTION:   Provide education as appropriate  30 ml Prostat BID, each supplement provides 100 kcals and 15 grams protein.    NUTRITION DIAGNOSIS:   Increased nutrient needs related to acute illness as evidenced by estimated needs.  GOAL:   Patient will meet greater than or equal to 90% of their needs  MONITOR:   PO intake, Supplement acceptance, Labs, Weight trends  REASON FOR ASSESSMENT:   Consult Diet education  ASSESSMENT:   58 yo male admitted with acute respiratory failure related to COVID 19 pneumonia. PMH includes HTN, DM, OSA   RD working remotely.  Pt on HFNC, currently in ICU 12 Recorded po intake 50-100% of meals; 85% on average  Current wt 108.7 kg.   Lab Results  Component Value Date   HGBA1C 9.3 (H) 10/15/2019   Labs: CBGs 132-414 Meds: decadron, folic acid, NS at 50 ml/hr, ss novolog, novolog 3 times with meals, lantus, thiamine, vitamin C, zinc sulfate   Diet Order:   Diet Order            Diet heart healthy/carb modified Room service appropriate? Yes; Fluid consistency: Thin  Diet effective now              EDUCATION NEEDS:   Not appropriate for education at this time  Skin:  Skin Assessment: Reviewed RN Assessment  Last BM:  12/13  Height:   Ht Readings from Last 1 Encounters:  10/15/19 5\' 8"  (1.727 m)    Weight:   Wt Readings from Last 1 Encounters:  10/15/19 108.7 kg    Ideal Body Weight:  70 kg  BMI:  Body mass index is 36.44 kg/m.  Estimated Nutritional Needs:   Kcal:  2100-2300 kcals  Protein:  105-120 g  Fluid:  >/= 2 L    Lillieann Pavlich MS, RDN, LDN, CNSC [phone] Pager  [phone] Weekend/On-Call Pager

## 2019-10-17 NOTE — Plan of Care (Signed)
  Problem: Education: Goal: Knowledge of risk factors and measures for prevention of condition will improve Outcome: Progressing   Problem: Coping: Goal: Psychosocial and spiritual needs will be supported Outcome: Progressing   Problem: Respiratory: Goal: Will maintain a patent airway Outcome: Progressing Goal: Complications related to the disease process, condition or treatment will be avoided or minimized Outcome: Progressing   Problem: Education: Goal: Knowledge of General Education information will improve Description: Including pain rating scale, medication(s)/side effects and non-pharmacologic comfort measures Outcome: Progressing   Problem: Health Behavior/Discharge Planning: Goal: Ability to manage health-related needs will improve Outcome: Progressing   Problem: Clinical Measurements: Goal: Ability to maintain clinical measurements within normal limits will improve Outcome: Progressing Goal: Will remain free from infection Outcome: Progressing Goal: Diagnostic test results will improve Outcome: Progressing Goal: Respiratory complications will improve Outcome: Progressing Goal: Cardiovascular complication will be avoided Outcome: Progressing   Problem: Activity: Goal: Risk for activity intolerance will decrease Outcome: Progressing   Problem: Nutrition: Goal: Adequate nutrition will be maintained Outcome: Progressing   Problem: Coping: Goal: Level of anxiety will decrease Outcome: Progressing   Problem: Elimination: Goal: Will not experience complications related to bowel motility Outcome: Progressing Goal: Will not experience complications related to urinary retention Outcome: Progressing   Problem: Pain Managment: Goal: General experience of comfort will improve Outcome: Progressing   Problem: Safety: Goal: Ability to remain free from injury will improve Outcome: Progressing   Problem: Skin Integrity: Goal: Risk for impaired skin integrity will  decrease Outcome: Progressing   Problem: Metabolic: Goal: Ability to maintain appropriate glucose levels will improve Outcome: Progressing   

## 2019-10-18 LAB — COMPREHENSIVE METABOLIC PANEL
ALT: 27 U/L (ref 0–44)
AST: 19 U/L (ref 15–41)
Albumin: 2.7 g/dL — ABNORMAL LOW (ref 3.5–5.0)
Alkaline Phosphatase: 60 U/L (ref 38–126)
Anion gap: 10 (ref 5–15)
BUN: 24 mg/dL — ABNORMAL HIGH (ref 6–20)
CO2: 29 mmol/L (ref 22–32)
Calcium: 8.6 mg/dL — ABNORMAL LOW (ref 8.9–10.3)
Chloride: 102 mmol/L (ref 98–111)
Creatinine, Ser: 0.76 mg/dL (ref 0.61–1.24)
GFR calc Af Amer: >60 mL/min (ref >60)
GFR calc non Af Amer: >60 mL/min (ref >60)
Glucose, Bld: 177 mg/dL — ABNORMAL HIGH (ref 70–99)
Potassium: 4.4 mmol/L (ref 3.5–5.1)
Sodium: 141 mmol/L (ref 135–145)
Total Bilirubin: 0.5 mg/dL (ref 0.3–1.2)
Total Protein: 6 g/dL — ABNORMAL LOW (ref 6.5–8.1)

## 2019-10-18 LAB — CBC WITH DIFFERENTIAL/PLATELET
Abs Immature Granulocytes: 0.11 10*3/uL — ABNORMAL HIGH (ref 0.00–0.07)
Basophils Absolute: 0 10*3/uL (ref 0.0–0.1)
Basophils Relative: 0 %
Eosinophils Absolute: 0 10*3/uL (ref 0.0–0.5)
Eosinophils Relative: 0 %
HCT: 41.2 % (ref 39.0–52.0)
Hemoglobin: 14 g/dL (ref 13.0–17.0)
Immature Granulocytes: 2 %
Lymphocytes Relative: 16 %
Lymphs Abs: 0.9 10*3/uL (ref 0.7–4.0)
MCH: 31.7 pg (ref 26.0–34.0)
MCHC: 34 g/dL (ref 30.0–36.0)
MCV: 93.2 fL (ref 80.0–100.0)
Monocytes Absolute: 0.4 10*3/uL (ref 0.1–1.0)
Monocytes Relative: 7 %
Neutro Abs: 4.1 10*3/uL (ref 1.7–7.7)
Neutrophils Relative %: 75 %
Platelets: 192 10*3/uL (ref 150–400)
RBC: 4.42 MIL/uL (ref 4.22–5.81)
RDW: 13.3 % (ref 11.5–15.5)
WBC: 5.5 10*3/uL (ref 4.0–10.5)
nRBC: 0.4 % — ABNORMAL HIGH (ref 0.0–0.2)

## 2019-10-18 LAB — GLUCOSE, CAPILLARY
Glucose-Capillary: 206 mg/dL — ABNORMAL HIGH (ref 70–99)
Glucose-Capillary: 157 mg/dL — ABNORMAL HIGH (ref 70–99)
Glucose-Capillary: 283 mg/dL — ABNORMAL HIGH (ref 70–99)
Glucose-Capillary: 392 mg/dL — ABNORMAL HIGH (ref 70–99)
Glucose-Capillary: 442 mg/dL — ABNORMAL HIGH (ref 70–99)

## 2019-10-18 LAB — C-REACTIVE PROTEIN: CRP: 1.3 mg/dL — ABNORMAL HIGH (ref <1.0)

## 2019-10-18 LAB — MAGNESIUM: Magnesium: 2 mg/dL (ref 1.7–2.4)

## 2019-10-18 LAB — FERRITIN: Ferritin: 507 ng/mL — ABNORMAL HIGH (ref 24–336)

## 2019-10-18 LAB — D-DIMER, QUANTITATIVE: D-Dimer, Quant: 0.58 ug/mL-FEU — ABNORMAL HIGH (ref 0.00–0.50)

## 2019-10-18 LAB — PHOSPHORUS: Phosphorus: 4.8 mg/dL — ABNORMAL HIGH (ref 2.5–4.6)

## 2019-10-18 MED ORDER — THIAMINE HCL 100 MG PO TABS
100.0000 mg | ORAL_TABLET | Freq: Every day | ORAL | Status: DC
  Administered 2019-10-19 – 2019-10-23 (×5): 100 mg via ORAL
  Filled 2019-10-18 (×5): qty 1

## 2019-10-18 MED ORDER — INSULIN ASPART 100 UNIT/ML ~~LOC~~ SOLN
15.0000 [IU] | Freq: Once | SUBCUTANEOUS | Status: AC
  Administered 2019-10-18: 20:00:00 15 [IU] via SUBCUTANEOUS

## 2019-10-18 MED ORDER — FOLIC ACID 1 MG PO TABS
1.0000 mg | ORAL_TABLET | Freq: Every day | ORAL | Status: DC
  Administered 2019-10-19 – 2019-10-23 (×5): 1 mg via ORAL
  Filled 2019-10-18 (×5): qty 1

## 2019-10-18 NOTE — Progress Notes (Signed)
Pt transferred to rm 163 by RN and RT. Monitored throughout. VSS. Placed to unit monitor, bedside report given. Cell phone, glasses, and belongings bag with pt. Care transferred.

## 2019-10-18 NOTE — Progress Notes (Signed)
Pt transported from [facility] ICU to Rm 163 on NRB without any complications.  Pt placed on 15L [place] once in room.  No issues at this time

## 2019-10-18 NOTE — Progress Notes (Signed)
Call made to pt's wife. Update given on pt condition and plan of care. Questions answered.

## 2019-10-18 NOTE — Progress Notes (Addendum)
PROGRESS NOTE    Steven Holmes  ZOX:[id number] DOB: 11/26/1960 DOA: 10/15/2019 [facility]   Brief Narrative:  : 58 y.o.WM PMHx diabetes type 2 controlled with complication, HTN, HLD, OSA/OHS, morbid obesity, complex regional pain syndrome, diagnosed with COVID-19 infection on 10/03/2019   Presented to the emergency room with a several day history of worsening shortness of breath. He denies associated chest.On arrival in the emergency room He was hypoxic with O2 sat in the mid 80s. He was placed on oxygen via nonrebreather continue to have sats around 90. He was subsequently placed on oxygen via high flow nasal cannula.He was afebrile, with respirations of 18 pulse rate of 101. White cell count was normal. As well as hemoglobin. Significant abnormal findings included evaded Covid biomarkers with LDH of 365, D-dimer of 1154 and ferritin of 811. Blood sugar was elevated at 329. In the emergency room he received IV Decadron as well as Solu-Medrol and started on remdesivir  Patient states wife also Covid positive sick but at home has been in and out of hospital multiple times.   Subjective: 12/15 last 24 hours afebrile, A/O x4, positive S OB, resting comfortably in supine position.    Assessment & Plan:   Active Problems:   HTN (hypertension)   OSA (obstructive sleep apnea)   Diabetes mellitus type 2, controlled, with complications (HCC)   Essential hypertension   Obesity hypoventilation syndrome (HCC)   Obesity, Class III, BMI 40-49.9 (morbid obesity) (HCC)   Complex regional pain syndrome I   Pneumonia due to COVID-19 virus   Hyperosmolar hyperglycemic state (HHS) (HCC)   Diabetes mellitus type 2, uncontrolled, with complications (HCC)   Covid pneumonia/acute respiratory failure with hypoxia COVID-19 Labs  Recent Labs    10/16/19 0310 10/17/19 0643 10/18/19 0643 10/18/19 0644  DDIMER 0.78* 0.51* 0.58*  --   FERRITIN 827* 671*  --   507*  CRP 8.5* 3.2*  --  1.3*    Lab Results  Component Value Date   SARSCOV2NAA POSITIVE (A) 10/15/2019   -Decadron 6 mg daily -Remdesivir per pharmacy protocol -12/12 Actemra x1 dose -Combivent QID -Flutter valve -Prone 16 hours/day; if cannot tolerate prone 2 to 3 hours per shift -Titrate O2 to maintain SPO2> 88% -Vitamins per Covid protocol  OSA/OHS -See Covid pneumonia  Essential HTN -BP controlled without medication, monitor closely  Diabetes type 2 uncontrolled with complication/Hyperosmolar Hyperglycemia Nonketotic Syndrome -12/12 hemoglobin A1c= 9.3 -Endo tool protocol -12/13 Lantus 10 units; 2 hours after administration discontinue Endo tool protocol -12/13 consult diabetic nutrition; uncontrolled diabetes type 2 -12/13 consult diabetic coordinator uncontrolled diabetes type 2 -12/14 increase Lantus 15 units daily -12/14 increase NovoLog 10 units qac -Resistant SSI  Complex regional pain syndrome - PRN pain medication  Morbid obesity -See diabetes     DVT prophylaxis: Lovenox Code Status: Full Family Communication: 12/15 spoke with Steven Holmes (wife) counseled her on plan of care answered all questions Disposition Plan:    Consultants:  TBD  Procedures/Significant Events:  12/12 PCXR;Hypoinflation of the lungs with bibasilar and perihilar opacities concerning for pneumonia.   I have personally reviewed and interpreted all radiology studies and my findings are as above.  VENTILATOR SETTINGS: HFNC 12/15 Flow; 25 L/min FiO2; 65% SPO2 92%     Cultures 12/12 blood RIGHT hand NGTD x2 12/12 influenza A/B negative 12/12 respiratory virus panel negative 12/12 HIV screen negative 12/12 SARS coronavirus positive   Antimicrobials: Anti-infectives (From admission, onward)   Start  Dose/Rate Stop   10/16/19 1000  remdesivir 100 mg in sodium chloride 0.9 % 100 mL IVPB  Status:  Discontinued     100 mg 200 mL/hr over 30 Minutes Daily  10/15/19 1035   10/16/19 1000  remdesivir 100 mg in sodium chloride 0.9 % 100 mL IVPB     100 mg 200 mL/hr over 30 Minutes Daily 10/20/19 0959   10/15/19 1030  remdesivir 200 mg in sodium chloride 0.9% 250 mL IVPB  Status:  Discontinued     200 mg 580 mL/hr over 30 Minutes Once 10/15/19 1037       Devices    LINES / TUBES:      Continuous Infusions: . sodium chloride Stopped (10/15/19 1157)  . remdesivir 100 mg in NS 100 mL 100 mg (10/18/19 1031)     Objective: Vitals:   10/18/19 0800 10/18/19 0900 10/18/19 1000 10/18/19 1100  BP: 115/77 118/80 126/82 103/66  Pulse: 67 68 80 67  Resp: (!) 25 (!) 23 (!) 26 (!) 29  Temp: 98.1 F (36.7 C)     TempSrc: Oral     SpO2: 93% 96% 93% 94%  Weight:      Height:        Intake/Output Summary (Last 24 hours) at 10/18/2019 1200 Last data filed at 10/18/2019 1100 Gross per 24 hour  Intake 480 ml  Output 1900 ml  Net -1420 ml   Filed Weights   10/15/19 1000  Weight: 108.7 kg   Physical Exam:  General: A/O x4, positive acute respiratory distress Eyes: negative scleral hemorrhage, negative anisocoria, negative icterus ENT: Negative Runny nose, negative gingival bleeding, Neck:  Negative scars, masses, torticollis, lymphadenopathy, JVD Lungs: Tachypneic, clear to auscultation bilaterally without wheezes or crackles Cardiovascular: Regular rate and rhythm without murmur gallop or rub normal S1 and S2 Abdomen: Morbidly OBESE negative abdominal pain, nondistended, positive soft, bowel sounds, no rebound, no ascites, no appreciable mass Extremities: No significant cyanosis, clubbing, or edema bilateral lower extremities Skin: Negative rashes, lesions, ulcers Psychiatric:  Negative depression, negative anxiety, negative fatigue, negative mania  Central nervous system:  Cranial nerves II through XII intact, tongue/uvula midline, all extremities muscle strength 5/5, sensation intact throughout, negative dysarthria, negative  expressive aphasia, negative receptive aphasia.   .     Data Reviewed: Care during the described time interval was provided by me .  I have reviewed this patient's available data, including medical history, events of note, physical examination, and all test results as part of my evaluation.   CBC: Recent Labs  Lab 10/14/19 2023 10/15/19 0640 10/15/19 1100 10/16/19 0310 10/17/19 0643 10/18/19 0643  WBC 5.5 4.0 4.5 6.9 6.2 5.5  NEUTROABS 4.4  --  3.8 5.6 4.8 4.1  HGB 15.2 13.9 15.2 13.9 14.0 14.0  HCT 43.6 41.0 44.1 41.5 41.7 41.2  MCV 90.6 92.6 93.2 93.9 94.1 93.2  PLT 143* 142* 157 181 196 192   Basic Metabolic Panel: Recent Labs  Lab 10/15/19 0640 10/15/19 1100 10/16/19 0310 10/17/19 0643 10/18/19 0643  NA 138 139 141 143 141  K 4.7 3.5 4.1 4.0 4.4  CL 103 101 106 104 102  CO2 21* 22 27 28 29   GLUCOSE 432* 247* 149* 215* 177*  BUN 22* 22* 24* 28* 24*  CREATININE 0.75 0.80 0.55* 0.84 0.76  CALCIUM 8.0* 8.6* 8.7* 8.9 8.6*  MG 2.5* 2.3 2.3 2.2 2.0  PHOS 3.7 3.1 3.4 4.2 4.8*   GFR: Estimated Creatinine Clearance: 120.3 mL/min (by C-G  formula based on SCr of 0.76 mg/dL). Liver Function Tests: Recent Labs  Lab 10/14/19 2023 10/15/19 1100 10/16/19 0310 10/17/19 0643 10/18/19 0643  AST 33 34 ALT 32 35 ALKPHOS 67 66 60 64 60  BILITOT 1.2 0.7 0.6 0.6 0.5  PROT 7.3 7.2 6.4* 6.3* 6.0*  ALBUMIN 3.3* 3.0* 2.8* 2.7* 2.7*   No results for input(s): LIPASE, AMYLASE in the last 168 hours. No results for input(s): AMMONIA in the last 168 hours. Coagulation Profile: No results for input(s): INR, PROTIME in the last 168 hours. Cardiac Enzymes: No results for input(s): CKTOTAL, CKMB, CKMBINDEX, TROPONINI in the last 168 hours. BNP (last 3 results) No results for input(s): PROBNP in the last 8760 hours. HbA1C: No results for input(s): HGBA1C in the last 72 hours. CBG: Recent Labs  Lab 10/17/19 1623 10/17/19 2008 10/17/19 2343 10/18/19 0435  10/18/19 0806  GLUCAP 379* 373* 329* 206* 157*   Lipid Profile: No results for input(s): CHOL, HDL, LDLCALC, TRIG, CHOLHDL, LDLDIRECT in the last 72 hours. Thyroid Function Tests: No results for input(s): TSH, T4TOTAL, FREET4, T3FREE, THYROIDAB in the last 72 hours. Anemia Panel: Recent Labs    10/17/19 0643 10/18/19 0644  FERRITIN 671* 507*   Urine analysis:    Component Value Date/Time   COLORURINE YELLOW 09/06/2013 2047   APPEARANCEUR CLEAR 09/06/2013 2047   LABSPEC 1.024 09/06/2013 2047   PHURINE 5.0 09/06/2013 2047   GLUCOSEU NEGATIVE 09/06/2013 2047   HGBUR NEGATIVE 09/06/2013 2047   BILIRUBINUR NEGATIVE 09/06/2013 2047   KETONESUR NEGATIVE 09/06/2013 2047   PROTEINUR NEGATIVE 09/06/2013 2047   UROBILINOGEN 0.2 09/06/2013 2047   NITRITE NEGATIVE 09/06/2013 2047   LEUKOCYTESUR NEGATIVE 09/06/2013 2047   Sepsis Labs: (procalcitonin:4,lacticidven:4)  ) Recent Results (from the past 240 hour(s))  Culture, blood (routine x 2)     Status: None (Preliminary result)   Collection Time: 10/15/19 12:40 AM   Specimen: BLOOD RIGHT HAND  Result Value Ref Range Status   Specimen Description BLOOD RIGHT HAND  Final   Special Requests   Final    BOTTLES DRAWN AEROBIC AND ANAEROBIC Blood Culture adequate volume   Culture   Final    NO GROWTH 3 DAYS Performed at [facility], [address] [place], [place] [address]    Report Status PENDING  Incomplete  Culture, blood (routine x 2)     Status: None (Preliminary result)   Collection Time: 10/15/19 12:40 AM   Specimen: BLOOD RIGHT HAND  Result Value Ref Range Status   Specimen Description BLOOD RIGHT HAND  Final   Special Requests   Final    BOTTLES DRAWN AEROBIC AND ANAEROBIC Blood Culture adequate volume   Culture   Final    NO GROWTH 3 DAYS Performed at [facility], [address] [place], [place] [address]    Report Status PENDING  Incomplete  Respiratory Panel by PCR     Status: None    Collection Time: 10/15/19 12:40 AM   Specimen: Nasopharyngeal Swab; Respiratory  Result Value Ref Range Status   Adenovirus NOT DETECTED NOT DETECTED Final   Coronavirus 229E NOT DETECTED NOT DETECTED Final    Comment: (NOTE) The Coronavirus on the Respiratory Panel, DOES NOT test for the novel  Coronavirus (2019 nCoV)    Coronavirus HKU1 NOT DETECTED NOT DETECTED Final   Coronavirus NL63 NOT DETECTED NOT DETECTED Final   Coronavirus OC43 NOT DETECTED NOT DETECTED Final   Metapneumovirus  NOT DETECTED NOT DETECTED Final   Rhinovirus / Enterovirus NOT DETECTED NOT DETECTED Final   Influenza A NOT DETECTED NOT DETECTED Final   Influenza B NOT DETECTED NOT DETECTED Final   Parainfluenza Virus 1 NOT DETECTED NOT DETECTED Final   Parainfluenza Virus 2 NOT DETECTED NOT DETECTED Final   Parainfluenza Virus 3 NOT DETECTED NOT DETECTED Final   Parainfluenza Virus 4 NOT DETECTED NOT DETECTED Final   Respiratory Syncytial Virus NOT DETECTED NOT DETECTED Final   Bordetella pertussis NOT DETECTED NOT DETECTED Final   Chlamydophila pneumoniae NOT DETECTED NOT DETECTED Final   Mycoplasma pneumoniae NOT DETECTED NOT DETECTED Final    Comment: Performed at [facility], [place] [address], [place], [place] [address]  Respiratory Panel by PCR     Status: None   Collection Time: 10/15/19  3:15 PM  Result Value Ref Range Status   Adenovirus NOT DETECTED NOT DETECTED Final   Coronavirus 229E NOT DETECTED NOT DETECTED Final    Comment: (NOTE) The Coronavirus on the Respiratory Panel, DOES NOT test for the novel  Coronavirus (2019 nCoV)    Coronavirus HKU1 NOT DETECTED NOT DETECTED Final   Coronavirus NL63 NOT DETECTED NOT DETECTED Final   Coronavirus OC43 NOT DETECTED NOT DETECTED Final   Metapneumovirus NOT DETECTED NOT DETECTED Final   Rhinovirus / Enterovirus NOT DETECTED NOT DETECTED Final   Influenza A NOT DETECTED NOT DETECTED Final   Influenza B NOT DETECTED NOT DETECTED Final    Parainfluenza Virus 1 NOT DETECTED NOT DETECTED Final   Parainfluenza Virus 2 NOT DETECTED NOT DETECTED Final   Parainfluenza Virus 3 NOT DETECTED NOT DETECTED Final   Parainfluenza Virus 4 NOT DETECTED NOT DETECTED Final   Respiratory Syncytial Virus NOT DETECTED NOT DETECTED Final   Bordetella pertussis NOT DETECTED NOT DETECTED Final   Chlamydophila pneumoniae NOT DETECTED NOT DETECTED Final   Mycoplasma pneumoniae NOT DETECTED NOT DETECTED Final    Comment: Performed at [facility], [place]. [address], [place], [place] [address]  MRSA PCR Screening     Status: None   Collection Time: 10/15/19  3:35 PM  Result Value Ref Range Status   MRSA by PCR NEGATIVE NEGATIVE Final    Comment:        The GeneXpert MRSA Assay (FDA approved for NASAL specimens only), is one component of a comprehensive MRSA colonization surveillance program. It is not intended to diagnose MRSA infection nor to guide or monitor treatment for MRSA infections. Performed at [facility], [place] [address] [place], [place] [address]   SARS CORONAVIRUS 2 (TAT 6-24 HRS) Nasopharyngeal Nasopharyngeal Swab     Status: Abnormal   Collection Time: 10/15/19  4:30 PM   Specimen: Nasopharyngeal Swab  Result Value Ref Range Status   SARS Coronavirus 2 POSITIVE (A) NEGATIVE Final    Comment: RESULT CALLED TO, READ BACK BY AND VERIFIED WITH: MORGAN SCHDIMT RN.@[email or web address]  ON 12.13.2020 BY TCALDWELL MT. (NOTE) SARS-CoV-2 target nucleic acids are DETECTED. The SARS-CoV-2 RNA is generally detectable in upper and lower respiratory specimens during the acute phase of infection. Positive results are indicative of the presence of SARS-CoV-2 RNA. Clinical correlation with patient history and other diagnostic information is  necessary to determine patient infection status. Positive results do not rule out bacterial infection or co-infection with other viruses.  The expected result is Negative. Fact Sheet for  Patients: SugarRoll.be Fact Sheet for Healthcare Providers: https://www.Koltin Wehmeyer-mathews.com/ This test is not yet approved or cleared by  the [employer] and  has been authorized for detection and/or diagnosis of SARS-CoV-2 by FDA under an Emergency Use Authorization (EUA). This EUA will remain  in effect (meaning this test c an be used) for the duration of the COVID-19 declaration under Section 564(b)(1) of the Act, 21 U.S.C. section 360bbb-3(b)(1), unless the authorization is terminated or revoked sooner. Performed at [facility], [address] [place], [place] [address]          Radiology Studies: No results found.      Scheduled Meds: . aspirin EC  81 mg Oral Daily  . Chlorhexidine Gluconate Cloth  6 each Topical Daily  . dexamethasone (DECADRON) injection  6 mg Intravenous Q24H  . enoxaparin (LOVENOX) injection  50 mg Subcutaneous Q12H  . feeding supplement (PRO-STAT SUGAR FREE 64)  30 mL Oral BID  . folic acid  1 mg Intravenous Daily  . insulin aspart  0-20 Units Subcutaneous Q4H  . insulin aspart  10 Units Subcutaneous TID WC  . insulin glargine  15 Units Subcutaneous Daily  . Ipratropium-Albuterol  1 puff Inhalation Q6H  . mouth rinse  15 mL Mouth Rinse BID  . thiamine injection  100 mg Intravenous Daily  . vitamin C  500 mg Oral Daily  . zinc sulfate  220 mg Oral Daily   Continuous Infusions: . sodium chloride Stopped (10/15/19 1157)  . remdesivir 100 mg in NS 100 mL 100 mg (10/18/19 1031)     LOS: 3 days   The patient is critically ill with multiple organ systems failure and requires high complexity decision making for assessment and support, frequent evaluation and titration of therapies, application of advanced monitoring technologies and extensive interpretation of multiple databases. Critical Care Time devoted to patient care services described in this note  Time spent: 40 minutes     [place],  Roselind Messier, MD [facility] Pager [phone]  If 7PM-7AM, please contact night-coverage [email or web address] Password [email or web address] 10/18/2019, 12:00 PM

## 2019-10-19 DIAGNOSIS — J9601 Acute respiratory failure with hypoxia: Secondary | ICD-10-CM

## 2019-10-19 LAB — COMPREHENSIVE METABOLIC PANEL
ALT: 30 U/L (ref 0–44)
AST: 22 U/L (ref 15–41)
Albumin: 3 g/dL — ABNORMAL LOW (ref 3.5–5.0)
Alkaline Phosphatase: 61 U/L (ref 38–126)
Anion gap: 13 (ref 5–15)
BUN: 25 mg/dL — ABNORMAL HIGH (ref 6–20)
CO2: 27 mmol/L (ref 22–32)
Calcium: 8.8 mg/dL — ABNORMAL LOW (ref 8.9–10.3)
Chloride: 99 mmol/L (ref 98–111)
Creatinine, Ser: 0.7 mg/dL (ref 0.61–1.24)
GFR calc Af Amer: >60 mL/min (ref >60)
GFR calc non Af Amer: >60 mL/min (ref >60)
Glucose, Bld: 246 mg/dL — ABNORMAL HIGH (ref 70–99)
Potassium: 4.4 mmol/L (ref 3.5–5.1)
Sodium: 139 mmol/L (ref 135–145)
Total Bilirubin: 0.8 mg/dL (ref 0.3–1.2)
Total Protein: 6.2 g/dL — ABNORMAL LOW (ref 6.5–8.1)

## 2019-10-19 LAB — CBC WITH DIFFERENTIAL/PLATELET
Abs Immature Granulocytes: 0.21 10*3/uL — ABNORMAL HIGH (ref 0.00–0.07)
Basophils Absolute: 0 10*3/uL (ref 0.0–0.1)
Basophils Relative: 0 %
Eosinophils Absolute: 0 10*3/uL (ref 0.0–0.5)
Eosinophils Relative: 1 %
HCT: 44.2 % (ref 39.0–52.0)
Hemoglobin: 14.7 g/dL (ref 13.0–17.0)
Immature Granulocytes: 4 %
Lymphocytes Relative: 15 %
Lymphs Abs: 0.8 10*3/uL (ref 0.7–4.0)
MCH: 31.3 pg (ref 26.0–34.0)
MCHC: 33.3 g/dL (ref 30.0–36.0)
MCV: 94 fL (ref 80.0–100.0)
Monocytes Absolute: 0.3 10*3/uL (ref 0.1–1.0)
Monocytes Relative: 6 %
Neutro Abs: 4 10*3/uL (ref 1.7–7.7)
Neutrophils Relative %: 74 %
Platelets: 200 10*3/uL (ref 150–400)
RBC: 4.7 MIL/uL (ref 4.22–5.81)
RDW: 13.5 % (ref 11.5–15.5)
WBC: 5.4 10*3/uL (ref 4.0–10.5)
nRBC: 0.4 % — ABNORMAL HIGH (ref 0.0–0.2)

## 2019-10-19 LAB — C-REACTIVE PROTEIN: CRP: 1.4 mg/dL — ABNORMAL HIGH (ref <1.0)

## 2019-10-19 LAB — PHOSPHORUS: Phosphorus: 4.7 mg/dL — ABNORMAL HIGH (ref 2.5–4.6)

## 2019-10-19 LAB — GLUCOSE, CAPILLARY
Glucose-Capillary: 128 mg/dL — ABNORMAL HIGH (ref 70–99)
Glucose-Capillary: 191 mg/dL — ABNORMAL HIGH (ref 70–99)
Glucose-Capillary: 213 mg/dL — ABNORMAL HIGH (ref 70–99)
Glucose-Capillary: 287 mg/dL — ABNORMAL HIGH (ref 70–99)
Glucose-Capillary: 378 mg/dL — ABNORMAL HIGH (ref 70–99)
Glucose-Capillary: 456 mg/dL — ABNORMAL HIGH (ref 70–99)

## 2019-10-19 LAB — FERRITIN: Ferritin: 490 ng/mL — ABNORMAL HIGH (ref 24–336)

## 2019-10-19 LAB — D-DIMER, QUANTITATIVE: D-Dimer, Quant: 0.62 ug/mL-FEU — ABNORMAL HIGH (ref 0.00–0.50)

## 2019-10-19 LAB — MAGNESIUM: Magnesium: 2 mg/dL (ref 1.7–2.4)

## 2019-10-19 LAB — GLUCOSE, RANDOM: Glucose, Bld: 360 mg/dL — ABNORMAL HIGH (ref 70–99)

## 2019-10-19 MED ORDER — ENOXAPARIN SODIUM 60 MG/0.6ML ~~LOC~~ SOLN
50.0000 mg | SUBCUTANEOUS | Status: DC
  Administered 2019-10-19 – 2019-10-22 (×4): 50 mg via SUBCUTANEOUS
  Filled 2019-10-19 (×7): qty 0.6

## 2019-10-19 MED ORDER — SODIUM CHLORIDE 0.9 % IV SOLN: 100.0000 mg | Freq: Every day | INTRAVENOUS | Status: DC

## 2019-10-19 MED ORDER — DEXAMETHASONE SODIUM PHOSPHATE 10 MG/ML IJ SOLN
8.0000 mg | INTRAMUSCULAR | Status: DC
  Administered 2019-10-19 – 2019-10-20 (×2): 8 mg via INTRAVENOUS
  Filled 2019-10-19 (×2): qty 1

## 2019-10-19 MED ORDER — INSULIN ASPART 100 UNIT/ML ~~LOC~~ SOLN
15.0000 [IU] | Freq: Once | SUBCUTANEOUS | Status: AC
  Administered 2019-10-19: 21:00:00 15 [IU] via SUBCUTANEOUS

## 2019-10-19 NOTE — Progress Notes (Signed)
Family Update:  I had called this patient's wife and gave her an update about her husbands care since yesterday. I was able to answer all questions and confirmed with her that no major changes have been made to the patient's treatment plan at this time.

## 2019-10-19 NOTE — Progress Notes (Signed)
[facility] PROGRESS NOTE    Progress Note  Steven Holmes  ZWC:[id number] DOB: Apr 21, 1961 DOA: 10/15/2019 PCP: [facility]     Brief Narrative:   Steven Holmes is an 58 y.o. male past medical history of diabetes mellitus type 2 with complications, essential hypertension obstructive sleep apnea/obesity hypoventilation syndrome, morbid obesity resented to the emergency room with several days history of shortness of breath in the ED was found hypoxic in the 80s was placed on nonrebreather and saturations improved to the 90s.  Covid 2 was positive on 10/15/2019 , started on empiric IV remdesivir and steroids and transferred to [facility].  Assessment/Plan:   Acute respiratory failure with hypoxia due to pneumonia due to COVID-19: Current 15 L of high flow nasal cannula to keep saturations greater than 94%. He is currently on IV remdesivir and steroids.  Continue vitamin C and zinc.  Has completed his course of IV remdesivir, due to his high oxygen need will continue IV remdesivir for total of 10 days. He received a dose of IV Actemra on 10/15/2019. To keep the patient peripherally 16 hours a day for now prone out of bed to chair. Keep strict I's and O's and daily weights.  Obstructive sleep apnea/obesity hypoventilation syndrome: Stable.  Essential hypertension: Controlled without medications continue to monitor closely.  Diabetes mellitus type 2 uncontrolled with complications/hyperosmolar hyperglycemic nonketotic syndrome: With an A1c on admission of 9.3, he was started on IV insulin drip to control Blood glucose It has been transitioned to long-acting insulin plus sliding scale. Glucose seems to be fairly controlled.   DVT prophylaxis: lovenox Family Communication:none Disposition Plan/Barrier to D/C: unable to determine Code Status:     Code Status Orders  (From admission, onward)         Start     Ordered   10/15/19 1015  Full code   Continuous     10/15/19 1022        Code Status History    Date Active Date Inactive Code Status Order ID Comments User Context   10/14/2019 2305 10/15/2019 0937 Full Code [id number]  Andris Baumann, MD ED   Advance Care Planning Activity        IV Access:    Peripheral IV   Procedures and diagnostic studies:   No results found.   Medical Consultants:    None.  Anti-Infectives:   IV remdesivir  Subjective:    Steven Holmes he relates his breathing is unchanged still has difficulty catching his breath.  Objective:    Vitals:   10/19/19 0000 10/19/19 0400 10/19/19 0409 10/19/19 0500  BP: 110/72  (!) 138/97   Pulse: 65 60 65   Resp: (!) 22 17 18 12   Temp: 98.2 F (36.8 C)  98.2 F (36.8 C)   TempSrc: Oral  Oral   SpO2: 99% 96% 99%   Weight:      Height:       SpO2: 99 % O2 Flow Rate (L/min): 15 L/min FiO2 (%): (S) 100 %   Intake/Output Summary (Last 24 hours) at 10/19/2019 0836 Last data filed at 10/19/2019 0600 Gross per 24 hour  Intake 1540 ml  Output 2450 ml  Net -910 ml   Filed Weights   10/15/19 1000  Weight: 108.7 kg    Exam: General exam: In no acute distress. Respiratory system: Good air movement and diffuse crackles bilaterally Cardiovascular system: S1 & S2 heard, RRR. No JVD.  Gastrointestinal system: Abdomen is nondistended, soft  and nontender.  Central nervous system: Alert and oriented. No focal neurological deficits. Extremities: No pedal edema. Skin: No rashes, lesions or ulcers Psychiatry: Judgement and insight appear normal. Mood & affect appropriate.   Data Reviewed:    Labs: Basic Metabolic Panel: Recent Labs  Lab 10/15/19 1100 10/16/19 0310 10/17/19 0643 10/18/19 0643 10/19/19 0100  NA 139 141 143 141 139  K 3.5 4.1 4.0 4.4 4.4  CL 101 106 104 102 99  CO2 22 27 28 29 27   GLUCOSE 247* 149* 215* 177* 246*  BUN 22* 24* 28* 24* 25*  CREATININE 0.80 0.55* 0.84 0.76 0.70  CALCIUM 8.6* 8.7* 8.9  8.6* 8.8*  MG 2.3 2.3 2.2 2.0 2.0  PHOS 3.1 3.4 4.2 4.8* 4.7*   GFR Estimated Creatinine Clearance: 120.3 mL/min (by C-G formula based on SCr of 0.7 mg/dL). Liver Function Tests: Recent Labs  Lab 10/15/19 1100 10/16/19 0310 10/17/19 0643 10/18/19 0643 10/19/19 0100  AST 34 31 20 19 22   ALT 35 30 29 27 30   ALKPHOS 66 60 64 60 61  BILITOT 0.7 0.6 0.6 0.5 0.8  PROT 7.2 6.4* 6.3* 6.0* 6.2*  ALBUMIN 3.0* 2.8* 2.7* 2.7* 3.0*   No results for input(s): LIPASE, AMYLASE in the last 168 hours. No results for input(s): AMMONIA in the last 168 hours. Coagulation profile No results for input(s): INR, PROTIME in the last 168 hours. COVID-19 Labs  Recent Labs    10/17/19 0643 10/18/19 0643 10/18/19 0644 10/19/19 0100  DDIMER 0.51* 0.58*  --  0.62*  FERRITIN 671*  --  507* 490*  CRP 3.2*  --  1.3* 1.4*    Lab Results  Component Value Date   SARSCOV2NAA POSITIVE (A) 10/15/2019    CBC: Recent Labs  Lab 10/15/19 1100 10/16/19 0310 10/17/19 0643 10/18/19 0643 10/19/19 0100  WBC 4.5 6.9 6.2 5.5 5.4  NEUTROABS 3.8 5.6 4.8 4.1 4.0  HGB 15.2 13.9 14.0 14.0 14.7  HCT 44.1 41.5 41.7 41.2 44.2  MCV 93.2 93.9 94.1 93.2 94.0  PLT 157 181 196 192 200   Cardiac Enzymes: No results for input(s): CKTOTAL, CKMB, CKMBINDEX, TROPONINI in the last 168 hours. BNP (last 3 results) No results for input(s): PROBNP in the last 8760 hours. CBG: Recent Labs  Lab 10/18/19 1620 10/18/19 1945 10/19/19 0008 10/19/19 0410 10/19/19 0732  GLUCAP 392* 442* 287* 191* 128*   D-Dimer: Recent Labs    10/18/19 0643 10/19/19 0100  DDIMER 0.58* 0.62*   Hgb A1c: No results for input(s): HGBA1C in the last 72 hours. Lipid Profile: No results for input(s): CHOL, HDL, LDLCALC, TRIG, CHOLHDL, LDLDIRECT in the last 72 hours. Thyroid function studies: No results for input(s): TSH, T4TOTAL, T3FREE, THYROIDAB in the last 72 hours.  Invalid input(s): FREET3 Anemia work up: Recent Labs     10/18/19 0644 10/19/19 0100  FERRITIN 507* 490*   Sepsis Labs: Recent Labs  Lab 10/14/19 2023 10/15/19 1100 10/16/19 0310 10/17/19 0643 10/18/19 0643 10/19/19 0100  PROCALCITON <0.10 <0.10  --   --   --   --   WBC 5.5 4.5 6.9 6.2 5.5 5.4  LATICACIDVEN 1.6  --   --   --   --   --    Microbiology Recent Results (from the past 240 hour(s))  Culture, blood (routine x 2)     Status: None (Preliminary result)   Collection Time: 10/15/19 12:40 AM   Specimen: BLOOD RIGHT HAND  Result Value Ref Range Status  Specimen Description BLOOD RIGHT HAND  Final   Special Requests   Final    BOTTLES DRAWN AEROBIC AND ANAEROBIC Blood Culture adequate volume   Culture   Final    NO GROWTH 4 DAYS Performed at [facility], [address] [place], [place] [address]    Report Status PENDING  Incomplete  Culture, blood (routine x 2)     Status: None (Preliminary result)   Collection Time: 10/15/19 12:40 AM   Specimen: BLOOD RIGHT HAND  Result Value Ref Range Status   Specimen Description BLOOD RIGHT HAND  Final   Special Requests   Final    BOTTLES DRAWN AEROBIC AND ANAEROBIC Blood Culture adequate volume   Culture   Final    NO GROWTH 4 DAYS Performed at [facility], [address] [place], [place] [address]    Report Status PENDING  Incomplete  Respiratory Panel by PCR     Status: None   Collection Time: 10/15/19 12:40 AM   Specimen: Nasopharyngeal Swab; Respiratory  Result Value Ref Range Status   Adenovirus NOT DETECTED NOT DETECTED Final   Coronavirus 229E NOT DETECTED NOT DETECTED Final    Comment: (NOTE) The Coronavirus on the Respiratory Panel, DOES NOT test for the novel  Coronavirus (2019 nCoV)    Coronavirus HKU1 NOT DETECTED NOT DETECTED Final   Coronavirus NL63 NOT DETECTED NOT DETECTED Final   Coronavirus OC43 NOT DETECTED NOT DETECTED Final   Metapneumovirus NOT DETECTED NOT DETECTED Final   Rhinovirus / Enterovirus NOT DETECTED NOT DETECTED  Final   Influenza A NOT DETECTED NOT DETECTED Final   Influenza B NOT DETECTED NOT DETECTED Final   Parainfluenza Virus 1 NOT DETECTED NOT DETECTED Final   Parainfluenza Virus 2 NOT DETECTED NOT DETECTED Final   Parainfluenza Virus 3 NOT DETECTED NOT DETECTED Final   Parainfluenza Virus 4 NOT DETECTED NOT DETECTED Final   Respiratory Syncytial Virus NOT DETECTED NOT DETECTED Final   Bordetella pertussis NOT DETECTED NOT DETECTED Final   Chlamydophila pneumoniae NOT DETECTED NOT DETECTED Final   Mycoplasma pneumoniae NOT DETECTED NOT DETECTED Final    Comment: Performed at [facility], [address] [place], [place] [address]  Respiratory Panel by PCR     Status: None   Collection Time: 10/15/19  3:15 PM  Result Value Ref Range Status   Adenovirus NOT DETECTED NOT DETECTED Final   Coronavirus 229E NOT DETECTED NOT DETECTED Final    Comment: (NOTE) The Coronavirus on the Respiratory Panel, DOES NOT test for the novel  Coronavirus (2019 nCoV)    Coronavirus HKU1 NOT DETECTED NOT DETECTED Final   Coronavirus NL63 NOT DETECTED NOT DETECTED Final   Coronavirus OC43 NOT DETECTED NOT DETECTED Final   Metapneumovirus NOT DETECTED NOT DETECTED Final   Rhinovirus / Enterovirus NOT DETECTED NOT DETECTED Final   Influenza A NOT DETECTED NOT DETECTED Final   Influenza B NOT DETECTED NOT DETECTED Final   Parainfluenza Virus 1 NOT DETECTED NOT DETECTED Final   Parainfluenza Virus 2 NOT DETECTED NOT DETECTED Final   Parainfluenza Virus 3 NOT DETECTED NOT DETECTED Final   Parainfluenza Virus 4 NOT DETECTED NOT DETECTED Final   Respiratory Syncytial Virus NOT DETECTED NOT DETECTED Final   Bordetella pertussis NOT DETECTED NOT DETECTED Final   Chlamydophila pneumoniae NOT DETECTED NOT DETECTED Final   Mycoplasma pneumoniae NOT DETECTED NOT DETECTED Final    Comment: Performed at [facility], [address], [place], [place]  [address]  MRSA PCR Screening     Status: None   Collection  Time: 10/15/19  3:35 PM  Result Value Ref Range Status   MRSA by PCR NEGATIVE NEGATIVE Final    Comment:        The GeneXpert MRSA Assay (FDA approved for NASAL specimens only), is one component of a comprehensive MRSA colonization surveillance program. It is not intended to diagnose MRSA infection nor to guide or monitor treatment for MRSA infections. Performed at [facility], [place] [address], [place], [place] [address]   SARS CORONAVIRUS 2 (TAT 6-24 HRS) Nasopharyngeal Nasopharyngeal Swab     Status: Abnormal   Collection Time: 10/15/19  4:30 PM   Specimen: Nasopharyngeal Swab  Result Value Ref Range Status   SARS Coronavirus 2 POSITIVE (A) NEGATIVE Final    Comment: RESULT CALLED TO, READ BACK BY AND VERIFIED WITH: MORGAN SCHDIMT RN.@[email or web address]  ON 12.13.2020 BY TCALDWELL MT. (NOTE) SARS-CoV-2 target nucleic acids are DETECTED. The SARS-CoV-2 RNA is generally detectable in upper and lower respiratory specimens during the acute phase of infection. Positive results are indicative of the presence of SARS-CoV-2 RNA. Clinical correlation with patient history and other diagnostic information is  necessary to determine patient infection status. Positive results do not rule out bacterial infection or co-infection with other viruses.  The expected result is Negative. Fact Sheet for Patients: SugarRoll.be Fact Sheet for Healthcare Providers: https://www.woods-mathews.com/ This test is not yet approved or cleared by the [place] FDA and  has been authorized for detection and/or diagnosis of SARS-CoV-2 by FDA under an Emergency Use Authorization (EUA). This EUA will remain  in effect (meaning this test c an be used) for the duration of the COVID-19 declaration under Section 564(b)(1) of the Act, 21 U.S.C. section 360bbb-3(b)(1), unless the authorization is terminated or revoked sooner. Performed at [facility], [place] [address] [place], [place] [address]      Medications:   . aspirin EC  81 mg Oral Daily  . Chlorhexidine Gluconate Cloth  6 each Topical Daily  . dexamethasone (DECADRON) injection  6 mg Intravenous Q24H  . enoxaparin (LOVENOX) injection  50 mg Subcutaneous Q12H  . feeding supplement (PRO-STAT SUGAR FREE 64)  30 mL Oral BID  . folic acid  1 mg Oral Daily  . insulin aspart  0-20 Units Subcutaneous Q4H  . insulin aspart  10 Units Subcutaneous TID WC  . insulin glargine  15 Units Subcutaneous Daily  . Ipratropium-Albuterol  1 puff Inhalation Q6H  . mouth rinse  15 mL Mouth Rinse BID  . thiamine  100 mg Oral Daily  . vitamin C  500 mg Oral Daily  . zinc sulfate  220 mg Oral Daily   Continuous Infusions: . sodium chloride Stopped (10/15/19 1157)  . remdesivir 100 mg in NS 100 mL Stopped (10/18/19 1103)      LOS: 4 days   Charlynne Cousins  [facility]  10/19/2019, 8:36 AM

## 2019-10-19 NOTE — Progress Notes (Signed)
Inpatient Diabetes Program Recommendations  AACE/ADA: New Consensus Statement on Inpatient Glycemic Control (2015)  Target Ranges:  Prepandial:   less than 140 mg/dL      Peak postprandial:   less than 180 mg/dL (1-2 hours)      Critically ill patients:  140 - 180 mg/dL   Lab Results  Component Value Date   GLUCAP 213 (H) 10/19/2019   HGBA1C 9.3 (H) 10/15/2019    Review of Glycemic Control  Diabetes history: DM2 Outpatient Diabetes medications: glipizide 20 mg bid, metformin 1000 mg bid Current orders for Inpatient glycemic control: Lantus 15 units QAM, Novolog 0-20 units Q4H + 10 units tidwc Decadron 8 mg daily Inpatient Diabetes Program Recommendations:    Consider increasing Lantus to 12 units bid.    Thanks  Adah Perl, RN, BC-ADM Inpatient Diabetes Coordinator Pager [phone] (8a-5p)

## 2019-10-19 NOTE — Plan of Care (Signed)
  Problem: Education: Goal: Knowledge of risk factors and measures for prevention of condition will improve Outcome: Progressing   Problem: Coping: Goal: Psychosocial and spiritual needs will be supported Outcome: Progressing   Problem: Respiratory: Goal: Will maintain a patent airway Outcome: Progressing Goal: Complications related to the disease process, condition or treatment will be avoided or minimized Outcome: Progressing   Problem: Education: Goal: Knowledge of General Education information will improve Description: Including pain rating scale, medication(s)/side effects and non-pharmacologic comfort measures Outcome: Progressing   Problem: Health Behavior/Discharge Planning: Goal: Ability to manage health-related needs will improve Outcome: Progressing   Problem: Clinical Measurements: Goal: Ability to maintain clinical measurements within normal limits will improve Outcome: Progressing Goal: Will remain free from infection Outcome: Progressing Goal: Diagnostic test results will improve Outcome: Progressing Goal: Respiratory complications will improve Outcome: Progressing Goal: Cardiovascular complication will be avoided Outcome: Progressing   Problem: Activity: Goal: Risk for activity intolerance will decrease Outcome: Progressing   Problem: Nutrition: Goal: Adequate nutrition will be maintained Outcome: Progressing   Problem: Coping: Goal: Level of anxiety will decrease Outcome: Progressing   Problem: Elimination: Goal: Will not experience complications related to bowel motility Outcome: Progressing   Problem: Pain Managment: Goal: General experience of comfort will improve Outcome: Progressing   Problem: Safety: Goal: Ability to remain free from injury will improve Outcome: Progressing   Problem: Skin Integrity: Goal: Risk for impaired skin integrity will decrease Outcome: Progressing   Problem: Metabolic: Goal: Ability to maintain  appropriate glucose levels will improve Outcome: Progressing

## 2019-10-20 LAB — COMPREHENSIVE METABOLIC PANEL
ALT: 37 U/L (ref 0–44)
AST: 22 U/L (ref 15–41)
Albumin: 2.9 g/dL — ABNORMAL LOW (ref 3.5–5.0)
Alkaline Phosphatase: 63 U/L (ref 38–126)
Anion gap: 13 (ref 5–15)
BUN: 27 mg/dL — ABNORMAL HIGH (ref 6–20)
CO2: 29 mmol/L (ref 22–32)
Calcium: 8.9 mg/dL (ref 8.9–10.3)
Chloride: 94 mmol/L — ABNORMAL LOW (ref 98–111)
Creatinine, Ser: 0.67 mg/dL (ref 0.61–1.24)
GFR calc Af Amer: >60 mL/min (ref >60)
GFR calc non Af Amer: >60 mL/min (ref >60)
Glucose, Bld: 211 mg/dL — ABNORMAL HIGH (ref 70–99)
Potassium: 4.2 mmol/L (ref 3.5–5.1)
Sodium: 136 mmol/L (ref 135–145)
Total Bilirubin: 0.6 mg/dL (ref 0.3–1.2)
Total Protein: 6.2 g/dL — ABNORMAL LOW (ref 6.5–8.1)

## 2019-10-20 LAB — CBC WITH DIFFERENTIAL/PLATELET
Abs Immature Granulocytes: 0.14 10*3/uL — ABNORMAL HIGH (ref 0.00–0.07)
Basophils Absolute: 0 10*3/uL (ref 0.0–0.1)
Basophils Relative: 0 %
Eosinophils Absolute: 0 10*3/uL (ref 0.0–0.5)
Eosinophils Relative: 1 %
HCT: 44.1 % (ref 39.0–52.0)
Hemoglobin: 14.9 g/dL (ref 13.0–17.0)
Immature Granulocytes: 2 %
Lymphocytes Relative: 12 %
Lymphs Abs: 0.7 10*3/uL (ref 0.7–4.0)
MCH: 31.4 pg (ref 26.0–34.0)
MCHC: 33.8 g/dL (ref 30.0–36.0)
MCV: 92.8 fL (ref 80.0–100.0)
Monocytes Absolute: 0.4 10*3/uL (ref 0.1–1.0)
Monocytes Relative: 6 %
Neutro Abs: 4.6 10*3/uL (ref 1.7–7.7)
Neutrophils Relative %: 79 %
Platelets: 201 10*3/uL (ref 150–400)
RBC: 4.75 MIL/uL (ref 4.22–5.81)
RDW: 13.4 % (ref 11.5–15.5)
WBC: 5.8 10*3/uL (ref 4.0–10.5)
nRBC: 0 % (ref 0.0–0.2)

## 2019-10-20 LAB — PHOSPHORUS: Phosphorus: 4.3 mg/dL (ref 2.5–4.6)

## 2019-10-20 LAB — GLUCOSE, CAPILLARY
Glucose-Capillary: 175 mg/dL — ABNORMAL HIGH (ref 70–99)
Glucose-Capillary: 211 mg/dL — ABNORMAL HIGH (ref 70–99)
Glucose-Capillary: 270 mg/dL — ABNORMAL HIGH (ref 70–99)
Glucose-Capillary: 284 mg/dL — ABNORMAL HIGH (ref 70–99)
Glucose-Capillary: 372 mg/dL — ABNORMAL HIGH (ref 70–99)
Glucose-Capillary: 376 mg/dL — ABNORMAL HIGH (ref 70–99)

## 2019-10-20 LAB — MAGNESIUM: Magnesium: 2.1 mg/dL (ref 1.7–2.4)

## 2019-10-20 LAB — CULTURE, BLOOD (ROUTINE X 2)
Culture: NO GROWTH
Culture: NO GROWTH
Special Requests: ADEQUATE
Special Requests: ADEQUATE

## 2019-10-20 LAB — D-DIMER, QUANTITATIVE: D-Dimer, Quant: 0.6 ug/mL-FEU — ABNORMAL HIGH (ref 0.00–0.50)

## 2019-10-20 LAB — C-REACTIVE PROTEIN: CRP: 0.6 mg/dL (ref <1.0)

## 2019-10-20 LAB — FERRITIN: Ferritin: 488 ng/mL — ABNORMAL HIGH (ref 24–336)

## 2019-10-20 MED ORDER — INSULIN ASPART 100 UNIT/ML ~~LOC~~ SOLN
0.0000 [IU] | Freq: Three times a day (TID) | SUBCUTANEOUS | Status: DC
  Administered 2019-10-20: 10:00:00 4 [IU] via SUBCUTANEOUS
  Administered 2019-10-20: 20 [IU] via SUBCUTANEOUS
  Administered 2019-10-20: 11 [IU] via SUBCUTANEOUS
  Administered 2019-10-21: 18:00:00 20 [IU] via SUBCUTANEOUS
  Administered 2019-10-21: 13:00:00 15 [IU] via SUBCUTANEOUS
  Administered 2019-10-21: 11 [IU] via SUBCUTANEOUS
  Administered 2019-10-22: 10 [IU] via SUBCUTANEOUS
  Administered 2019-10-22: 18:00:00 11 [IU] via SUBCUTANEOUS
  Administered 2019-10-22: 20 [IU] via SUBCUTANEOUS
  Administered 2019-10-23: 09:00:00 3 [IU] via SUBCUTANEOUS
  Administered 2019-10-23: 12:00:00 7 [IU] via SUBCUTANEOUS

## 2019-10-20 MED ORDER — INSULIN GLARGINE 100 UNIT/ML ~~LOC~~ SOLN
15.0000 [IU] | Freq: Two times a day (BID) | SUBCUTANEOUS | Status: DC
  Administered 2019-10-20 (×2): 15 [IU] via SUBCUTANEOUS
  Filled 2019-10-20 (×3): qty 0.15

## 2019-10-20 MED ORDER — INSULIN ASPART 100 UNIT/ML ~~LOC~~ SOLN
10.0000 [IU] | Freq: Once | SUBCUTANEOUS | Status: AC
  Administered 2019-10-20: 10 [IU] via SUBCUTANEOUS

## 2019-10-20 MED ORDER — INSULIN ASPART 100 UNIT/ML ~~LOC~~ SOLN
0.0000 [IU] | Freq: Every day | SUBCUTANEOUS | Status: DC
  Administered 2019-10-20 – 2019-10-21 (×2): 5 [IU] via SUBCUTANEOUS
  Administered 2019-10-22: 4 [IU] via SUBCUTANEOUS

## 2019-10-20 MED ORDER — INSULIN ASPART 100 UNIT/ML ~~LOC~~ SOLN
4.0000 [IU] | Freq: Three times a day (TID) | SUBCUTANEOUS | Status: DC
  Administered 2019-10-20 (×3): 4 [IU] via SUBCUTANEOUS

## 2019-10-20 NOTE — Evaluation (Signed)
Physical Therapy Evaluation Patient Details Name: Steven Holmes MRN: [id number] DOB: [birth year] Today's Date: 10/20/2019   History of Present Illness  Pt is a 58 y.o. male admitted 10/15/19 with SOB and hypxoia; COVID(+) on 12/12. PMH includes DM2, HTN, OSA/obesity hypoventilation syndrome.    Clinical Impression  Pt presents with an overall decrease in functional mobility secondary to above. PTA, pt independent, retired from work and lives with wife. Today, pt moving very well, ambulatory at supervision-level; SpO2 maintaining >/92% on 4L O2. Educ re: seated/standing therex, activity/positioning recommendations, importance of frequent mobility. Pt would benefit from continued acute PT services to maximize functional mobility and independence prior to d/c home.     Follow Up Recommendations No PT follow up    Equipment Recommendations  None recommended by PT    Recommendations for Other Services       Precautions / Restrictions Precautions Precautions: Fall Restrictions Weight Bearing Restrictions: No      Mobility  Bed Mobility Overal bed mobility: Independent             General bed mobility comments: Received sitting EOB  Transfers Overall transfer level: Independent Equipment used: None                Ambulation/Gait Ambulation/Gait assistance: Supervision Gait Distance (Feet): 100 Feet Assistive device: None Gait Pattern/deviations: Step-through pattern;Decreased stride length Gait velocity: Decreased Gait velocity interpretation: 1.31 - 2.62 ft/sec, indicative of limited community ambulator General Gait Details: Slow, steady gait without DME at supervision-level; ambulated laps in room, pt delcined hallway ambulation. SpO2 maintaining >/92% on 4L O2  Stairs            Wheelchair Mobility    Modified Rankin (Stroke Patients Only)       Balance Overall balance assessment: No apparent balance deficits (not formally assessed)                                            Pertinent Vitals/Pain Pain Assessment: Faces Faces Pain Scale: Hurts a little bit Pain Location: Chronic lower back/hip pain Pain Descriptors / Indicators: Sore Pain Intervention(s): Monitored during session    Home Living Family/patient expects to be discharged to:: Private residence Living Arrangements: Spouse/significant other Available Help at Discharge: Family;Available 24 hours/day Type of Home: House Home Access: Stairs to enter Entrance Stairs-Rails: None Entrance Stairs-Number of Steps: 3 Home Layout: One level Home Equipment: Grab bars - tub/shower;Shower seat;Walker - 2 wheels;Cane - single point Additional Comments: Children nearby and available to assist; currently assisting wife at home who is also sick with COVID    Prior Function Level of Independence: Independent         Comments: Sporadic use of SPC. Early retirement due to lumbar stenosis; used to work at [employer] Hand: Right    Extremity/Trunk Assessment   Upper Extremity Assessment Upper Extremity Assessment: Overall WFL for tasks assessed    Lower Extremity Assessment Lower Extremity Assessment: Overall WFL for tasks assessed    Cervical / Trunk Assessment Cervical / Trunk Assessment: Normal  Communication   Communication: No difficulties  Cognition Arousal/Alertness: Awake/alert Behavior During Therapy: WFL for tasks assessed/performed Overall Cognitive Status: Within Functional Limits for tasks assessed  General Comments      Exercises Other Exercises Other Exercises: Incentive spirometer 5x, flutter valve 5x - cues for technique on both Other Exercises: Discussed seated therex (LAQ, marching), standing therex (repeated sit<>stand, marching in place) - options within limits of lines/O2   Assessment/Plan    PT Assessment Patient needs  continued PT services  PT Problem List Decreased activity tolerance;Decreased balance;Decreased mobility;Cardiopulmonary status limiting activity       PT Treatment Interventions DME instruction;Gait training;Stair training;Functional mobility training;Therapeutic activities;Therapeutic exercise;Balance training;Patient/family education    PT Goals (Current goals can be found in the Care Plan section)  Acute Rehab PT Goals Patient Stated Goal: Return home PT Goal Formulation: With patient Time For Goal Achievement: 11/03/19 Potential to Achieve Goals: Good    Frequency Min 3X/week   Barriers to discharge        Co-evaluation               AM-PAC PT "6 Clicks" Mobility  Outcome Measure Help needed turning from your back to your side while in a flat bed without using bedrails?: None Help needed moving from lying on your back to sitting on the side of a flat bed without using bedrails?: None Help needed moving to and from a bed to a chair (including a wheelchair)?: None Help needed standing up from a chair using your arms (e.g., wheelchair or bedside chair)?: None Help needed to walk in hospital room?: None Help needed climbing 3-5 steps with a railing? : A Little 6 Click Score: 23    End of Session Equipment Utilized During Treatment: Oxygen Activity Tolerance: Patient tolerated treatment well Patient left: in chair;with call bell/phone within reach Nurse Communication: Mobility status PT Visit Diagnosis: Other abnormalities of gait and mobility (R26.89)    Time: 9604-5409 PT Time Calculation (min) (ACUTE ONLY): 19 min   Charges:   PT Evaluation $PT Eval Moderate Complexity: 1 Mod     Ina Homes, PT, DPT Acute Rehabilitation Services  Pager [phone] Office [phone]  Malachy Chamber 10/20/2019, 9:59 AM

## 2019-10-20 NOTE — Progress Notes (Addendum)
Inpatient Diabetes Program Recommendations  AACE/ADA: New Consensus Statement on Inpatient Glycemic Control (2015)  Target Ranges:  Prepandial:   less than 140 mg/dL      Peak postprandial:   less than 180 mg/dL (1-2 hours)      Critically ill patients:  140 - 180 mg/dL   Lab Results  Component Value Date   GLUCAP 175 (H) 10/20/2019   HGBA1C 9.3 (H) 10/15/2019    Review of Glycemic Control Results for Steven Holmes, Steven Holmes (MRN [id number]) as of 10/20/2019 08:07  Ref. Range 10/19/2019 07:32 10/19/2019 11:36 10/19/2019 16:14 10/19/2019 19:53 10/20/2019 00:10 10/20/2019 07:45  Glucose-Capillary Latest Ref Range: 70 - 99 mg/dL 128 (H) 213 (H) 378 (H) 456 (H) 270 (H) 175 (H)   Diabetes history: DM 2 Outpatient Diabetes medications:glipizide 20 mg bid, metformin 1000 mg bid Current orders for Inpatient glycemic control:Lantus 15 units q 12 hours, Novolog 0-20 units tid with meals and 4 units tidwc Decadron 8 mg daily  Inpatient Diabetes Program Recommendations:     Consider increasing Novolog back to 12 units tid with meals (hold if patient eats less than 50%) due to post-prandial spikes in blood sugars.    Thanks,  Adah Perl, RN, BC-ADM Inpatient Diabetes Coordinator Pager [phone] (8a-5p)

## 2019-10-20 NOTE — Plan of Care (Signed)
Teaching continued with patient and family today. All questions answered at this time. Emotional support provided to patient as needed. LS diminished bilaterally. Remains on HFNC @4L , weaning as tolerated. Discharge planning ongoing at this time. VSS. WBC 5.8 this AM. IV Remdesivir d/c'd today by MD. PT following. Encouraging OOB as tolerated. Ordered for cardiac/diabetic diet with good PO intake. Vdg in urinal. +Bsx4. No c/o pain noted at this time. Safe environment of care maintained. Skin intact. CFS AC+HS with coverage as ordered. Will continue to monitor.

## 2019-10-20 NOTE — Progress Notes (Signed)
Patient updated daughter via personal cell phone while nurse was present. No questions at present time. Patient encouraged to prone or lay on side. Pt encouraged and educated on use of IS & flutter valve.

## 2019-10-20 NOTE — Progress Notes (Signed)
Called and updated patient's wife Rip Harbour on patient's status. All questions answered at this time.

## 2019-10-20 NOTE — Progress Notes (Signed)
[facility] PROGRESS NOTE    Progress Note  Steven Holmes  ZTI:[id number] DOB: [birth year] DOA: 10/15/2019 PCP: [facility]     Brief Narrative:   Steven Holmes is an 58 y.o. male past medical history of diabetes mellitus type 2 with complications, essential hypertension obstructive sleep apnea/obesity hypoventilation syndrome, morbid obesity resented to the emergency room with several days history of shortness of breath in the ED was found hypoxic in the 80s was placed on nonrebreather and saturations improved to the 90s.  Covid 2 was positive on 10/15/2019 , started on empiric IV remdesivir and steroids and transferred to [facility].  Assessment/Plan:   Acute respiratory failure with hypoxia due to pneumonia due to COVID-19: His oxygen requirements have improved significantly, this morning he is on 6 L high flow nasal cannula to keep saturations greater than 94%. He will complete his 5-day course of remdesivir today, continue steroids, vitamin C and zinc. He did receive a dose of Actemra on 10/15/2019. Try to keep the patient prone for at least 16 hours of bed, when not prone out of bed to chair. Continue daily weights strict I's and O's.  He continues to be 5 L negative. Inflammatory markers are significantly improved, will start checking them. Patient relates his breathing is significantly better compared to yesterday.  Obstructive sleep apnea/obesity hypoventilation syndrome: Stable.  Essential hypertension: Controlled without medications continue to monitor closely.  Diabetes mellitus type 2 uncontrolled with complications/hyperosmolar hyperglycemic nonketotic syndrome: With an A1c of 9.3. Currently on long-acting insulin plus sliding scale, his blood glucose is erratic likely due to steroids, will double his long-acting insulin    DVT prophylaxis: lovenox Family Communication:none Disposition Plan/Barrier to D/C: unable to determine Code  Status:     Code Status Orders  (From admission, onward)         Start     Ordered   10/15/19 1015  Full code  Continuous     10/15/19 1022        Code Status History    Date Active Date Inactive Code Status Order ID Comments User Context   10/14/2019 2305 10/15/2019 0937 Full Code [id number]  Andris Baumann, MD ED   Advance Care Planning Activity        IV Access:    Peripheral IV   Procedures and diagnostic studies:   No results found.   Medical Consultants:    None.  Anti-Infectives:   IV remdesivir  Subjective:    Steven Holmes relates his breathing is significantly better compared to yesterday.  Objective:    Vitals:   10/19/19 1621 10/19/19 2126 10/20/19 0038 10/20/19 0400  BP: 121/80 (!) 136/91 (!) 132/91 124/86  Pulse: 64 74 73 (!) 41  Resp: (!) 21 19 18 19   Temp: 98 F (36.7 C)  98.3 F (36.8 C) 97.8 F (36.6 C)  TempSrc: Oral  Oral Oral  SpO2: 98% 93% 94% 92%  Weight:      Height:       SpO2: 92 % O2 Flow Rate (L/min): 6 L/min FiO2 (%): (S) 100 %   Intake/Output Summary (Last 24 hours) at 10/20/2019 0740 Last data filed at 10/20/2019 0350 Gross per 24 hour  Intake 390 ml  Output 3200 ml  Net -2810 ml   Filed Weights   10/15/19 1000  Weight: 108.7 kg    Exam: General exam: In no acute distress. Respiratory system: Good air movement and diffuse crackles bilaterally. Cardiovascular system: S1 &  S2 heard, RRR. No JVD. Gastrointestinal system: Abdomen is nondistended, soft and nontender.  Central nervous system: Alert and oriented. No focal neurological deficits. Extremities: No pedal edema. Skin: No rashes, lesions or ulcers Psychiatry: Judgement and insight appear normal. Mood & affect appropriate.    Data Reviewed:    Labs: Basic Metabolic Panel: Recent Labs  Lab 10/15/19 1100 10/16/19 0310 10/17/19 0643 10/18/19 0643 10/19/19 0100 10/19/19 2121  NA 139 141 143 141 139  --   K 3.5 4.1 4.0 4.4  4.4  --   CL 101 106 104 102 99  --   CO2 22 27 28 29 27   --   GLUCOSE 247* 149* 215* 177* 246* 360*  BUN 22* 24* 28* 24* 25*  --   CREATININE 0.80 0.55* 0.84 0.76 0.70  --   CALCIUM 8.6* 8.7* 8.9 8.6* 8.8*  --   MG 2.3 2.3 2.2 2.0 2.0  --   PHOS 3.1 3.4 4.2 4.8* 4.7*  --    GFR Estimated Creatinine Clearance: 120.3 mL/min (by C-G formula based on SCr of 0.7 mg/dL). Liver Function Tests: Recent Labs  Lab 10/15/19 1100 10/16/19 0310 10/17/19 0643 10/18/19 0643 10/19/19 0100  AST 34 31 20 19 22   ALT 35 30 29 27 30   ALKPHOS 66 60 64 60 61  BILITOT 0.7 0.6 0.6 0.5 0.8  PROT 7.2 6.4* 6.3* 6.0* 6.2*  ALBUMIN 3.0* 2.8* 2.7* 2.7* 3.0*   No results for input(s): LIPASE, AMYLASE in the last 168 hours. No results for input(s): AMMONIA in the last 168 hours. Coagulation profile No results for input(s): INR, PROTIME in the last 168 hours. COVID-19 Labs  Recent Labs    10/18/19 0643 10/18/19 0644 10/19/19 0100  DDIMER 0.58*  --  0.62*  FERRITIN  --  507* 490*  CRP  --  1.3* 1.4*    Lab Results  Component Value Date   SARSCOV2NAA POSITIVE (A) 10/15/2019    CBC: Recent Labs  Lab 10/16/19 0310 10/17/19 0643 10/18/19 0643 10/19/19 0100 10/20/19 0225  WBC 6.9 6.2 5.5 5.4 5.8  NEUTROABS 5.6 4.8 4.1 4.0 4.6  HGB 13.9 14.0 14.0 14.7 14.9  HCT 41.5 41.7 41.2 44.2 44.1  MCV 93.9 94.1 93.2 94.0 92.8  PLT 181 196 192 200 201   Cardiac Enzymes: No results for input(s): CKTOTAL, CKMB, CKMBINDEX, TROPONINI in the last 168 hours. BNP (last 3 results) No results for input(s): PROBNP in the last 8760 hours. CBG: Recent Labs  Lab 10/19/19 0732 10/19/19 1136 10/19/19 1614 10/19/19 1953 10/20/19 0010  GLUCAP 128* 213* 378* 456* 270*   D-Dimer: Recent Labs    10/18/19 0643 10/19/19 0100  DDIMER 0.58* 0.62*   Hgb A1c: No results for input(s): HGBA1C in the last 72 hours. Lipid Profile: No results for input(s): CHOL, HDL, LDLCALC, TRIG, CHOLHDL, LDLDIRECT in the last  72 hours. Thyroid function studies: No results for input(s): TSH, T4TOTAL, T3FREE, THYROIDAB in the last 72 hours.  Invalid input(s): FREET3 Anemia work up: Recent Labs    10/18/19 0644 10/19/19 0100  FERRITIN 507* 490*   Sepsis Labs: Recent Labs  Lab 10/14/19 2023 10/15/19 1100 10/17/19 0643 10/18/19 0643 10/19/19 0100 10/20/19 0225  PROCALCITON <0.10 <0.10  --   --   --   --   WBC 5.5 4.5 6.2 5.5 5.4 5.8  LATICACIDVEN 1.6  --   --   --   --   --    Microbiology Recent Results (from the past  240 hour(s))  Culture, blood (routine x 2)     Status: None (Preliminary result)   Collection Time: 10/15/19 12:40 AM   Specimen: BLOOD RIGHT HAND  Result Value Ref Range Status   Specimen Description BLOOD RIGHT HAND  Final   Special Requests   Final    BOTTLES DRAWN AEROBIC AND ANAEROBIC Blood Culture adequate volume   Culture   Final    NO GROWTH 4 DAYS Performed at [facility], [address] [place], [place] [address]    Report Status PENDING  Incomplete  Culture, blood (routine x 2)     Status: None (Preliminary result)   Collection Time: 10/15/19 12:40 AM   Specimen: BLOOD RIGHT HAND  Result Value Ref Range Status   Specimen Description BLOOD RIGHT HAND  Final   Special Requests   Final    BOTTLES DRAWN AEROBIC AND ANAEROBIC Blood Culture adequate volume   Culture   Final    NO GROWTH 4 DAYS Performed at [facility], [address] [place], [place] [address]    Report Status PENDING  Incomplete  Respiratory Panel by PCR     Status: None   Collection Time: 10/15/19 12:40 AM   Specimen: Nasopharyngeal Swab; Respiratory  Result Value Ref Range Status   Adenovirus NOT DETECTED NOT DETECTED Final   Coronavirus 229E NOT DETECTED NOT DETECTED Final    Comment: (NOTE) The Coronavirus on the Respiratory Panel, DOES NOT test for the novel  Coronavirus (2019 nCoV)    Coronavirus HKU1 NOT DETECTED NOT DETECTED Final   Coronavirus NL63 NOT DETECTED  NOT DETECTED Final   Coronavirus OC43 NOT DETECTED NOT DETECTED Final   Metapneumovirus NOT DETECTED NOT DETECTED Final   Rhinovirus / Enterovirus NOT DETECTED NOT DETECTED Final   Influenza A NOT DETECTED NOT DETECTED Final   Influenza B NOT DETECTED NOT DETECTED Final   Parainfluenza Virus 1 NOT DETECTED NOT DETECTED Final   Parainfluenza Virus 2 NOT DETECTED NOT DETECTED Final   Parainfluenza Virus 3 NOT DETECTED NOT DETECTED Final   Parainfluenza Virus 4 NOT DETECTED NOT DETECTED Final   Respiratory Syncytial Virus NOT DETECTED NOT DETECTED Final   Bordetella pertussis NOT DETECTED NOT DETECTED Final   Chlamydophila pneumoniae NOT DETECTED NOT DETECTED Final   Mycoplasma pneumoniae NOT DETECTED NOT DETECTED Final    Comment: Performed at [facility], [address] [place], [place] [address]  Respiratory Panel by PCR     Status: None   Collection Time: 10/15/19  3:15 PM  Result Value Ref Range Status   Adenovirus NOT DETECTED NOT DETECTED Final   Coronavirus 229E NOT DETECTED NOT DETECTED Final    Comment: (NOTE) The Coronavirus on the Respiratory Panel, DOES NOT test for the novel  Coronavirus (2019 nCoV)    Coronavirus HKU1 NOT DETECTED NOT DETECTED Final   Coronavirus NL63 NOT DETECTED NOT DETECTED Final   Coronavirus OC43 NOT DETECTED NOT DETECTED Final   Metapneumovirus NOT DETECTED NOT DETECTED Final   Rhinovirus / Enterovirus NOT DETECTED NOT DETECTED Final   Influenza A NOT DETECTED NOT DETECTED Final   Influenza B NOT DETECTED NOT DETECTED Final   Parainfluenza Virus 1 NOT DETECTED NOT DETECTED Final   Parainfluenza Virus 2 NOT DETECTED NOT DETECTED Final   Parainfluenza Virus 3 NOT DETECTED NOT DETECTED Final   Parainfluenza Virus 4 NOT DETECTED NOT DETECTED Final   Respiratory Syncytial Virus NOT DETECTED NOT DETECTED Final   Bordetella pertussis NOT DETECTED  NOT DETECTED Final   Chlamydophila pneumoniae NOT DETECTED NOT DETECTED Final   Mycoplasma  pneumoniae NOT DETECTED NOT DETECTED Final    Comment: Performed at [facility], [address] [place], [place] [address]  MRSA PCR Screening     Status: None   Collection Time: 10/15/19  3:35 PM  Result Value Ref Range Status   MRSA by PCR NEGATIVE NEGATIVE Final    Comment:        The GeneXpert MRSA Assay (FDA approved for NASAL specimens only), is one component of a comprehensive MRSA colonization surveillance program. It is not intended to diagnose MRSA infection nor to guide or monitor treatment for MRSA infections. Performed at [facility], [address] [place], [place] [address]   SARS CORONAVIRUS 2 (TAT 6-24 HRS) Nasopharyngeal Nasopharyngeal Swab     Status: Abnormal   Collection Time: 10/15/19  4:30 PM   Specimen: Nasopharyngeal Swab  Result Value Ref Range Status   SARS Coronavirus 2 POSITIVE (A) NEGATIVE Final    Comment: RESULT CALLED TO, READ BACK BY AND VERIFIED WITH: MORGAN SCHDIMT [email or web address].@[email or web address]  ON 12.13.2020 BY TCALDWELL MT. (NOTE) SARS-CoV-2 target nucleic acids are DETECTED. The SARS-CoV-2 RNA is generally detectable in upper and lower respiratory specimens during the acute phase of infection. Positive results are indicative of the presence of SARS-CoV-2 RNA. Clinical correlation with patient history and other diagnostic information is  necessary to determine patient infection status. Positive results do not rule out bacterial infection or co-infection with other viruses.  The expected result is Negative. Fact Sheet for Patients: HairSlick.nohttps://www.fda.gov/media/138098/download Fact Sheet for Healthcare Providers: quierodirigir.comhttps://www.fda.gov/media/138095/download This test is not yet approved or cleared by the [place] FDA and  has been authorized for detection and/or diagnosis of SARS-CoV-2 by FDA under an Emergency Use Authorization (EUA). This EUA will remain  in effect (meaning this test c an be used) for the duration of the COVID-19  declaration under Section 564(b)(1) of the Act, 21 U.S.C. section 360bbb-3(b)(1), unless the authorization is terminated or revoked sooner. Performed at [facility], [address] [place], [place] [address]      Medications:   . aspirin EC  81 mg Oral Daily  . Chlorhexidine Gluconate Cloth  6 each Topical Daily  . dexamethasone (DECADRON) injection  8 mg Intravenous Q24H  . enoxaparin (LOVENOX) injection  50 mg Subcutaneous Q24H  . feeding supplement (PRO-STAT SUGAR FREE 64)  30 mL Oral BID  . folic acid  1 mg Oral Daily  . insulin aspart  0-20 Units Subcutaneous Q4H  . insulin aspart  10 Units Subcutaneous TID WC  . insulin glargine  15 Units Subcutaneous Daily  . Ipratropium-Albuterol  1 puff Inhalation Q6H  . mouth rinse  15 mL Mouth Rinse BID  . thiamine  100 mg Oral Daily  . vitamin C  500 mg Oral Daily  . zinc sulfate  220 mg Oral Daily   Continuous Infusions: . sodium chloride Stopped (10/15/19 1157)  . remdesivir 100 mg in NS 100 mL        LOS: 5 days   Marinda ElkAbraham Feliz Ortiz  [facility]  10/20/2019, 7:40 AM

## 2019-10-20 NOTE — Progress Notes (Signed)
Notified Dr. Myna Hidalgo of high CBG.

## 2019-10-21 LAB — GLUCOSE, CAPILLARY
Glucose-Capillary: 260 mg/dL — ABNORMAL HIGH (ref 70–99)
Glucose-Capillary: 314 mg/dL — ABNORMAL HIGH (ref 70–99)
Glucose-Capillary: 329 mg/dL — ABNORMAL HIGH (ref 70–99)
Glucose-Capillary: 348 mg/dL — ABNORMAL HIGH (ref 70–99)
Glucose-Capillary: 422 mg/dL — ABNORMAL HIGH (ref 70–99)
Glucose-Capillary: 453 mg/dL — ABNORMAL HIGH (ref 70–99)
Glucose-Capillary: 467 mg/dL — ABNORMAL HIGH (ref 70–99)

## 2019-10-21 LAB — GLUCOSE, RANDOM: Glucose, Bld: 397 mg/dL — ABNORMAL HIGH (ref 70–99)

## 2019-10-21 MED ORDER — DEXAMETHASONE SODIUM PHOSPHATE 10 MG/ML IJ SOLN
6.0000 mg | INTRAMUSCULAR | Status: DC
  Administered 2019-10-21: 10:00:00 6 mg via INTRAVENOUS
  Filled 2019-10-21: qty 1

## 2019-10-21 MED ORDER — INSULIN ASPART 100 UNIT/ML ~~LOC~~ SOLN
8.0000 [IU] | Freq: Once | SUBCUTANEOUS | Status: AC
  Administered 2019-10-21: 23:00:00 8 [IU] via SUBCUTANEOUS

## 2019-10-21 MED ORDER — INSULIN GLARGINE 100 UNIT/ML ~~LOC~~ SOLN
30.0000 [IU] | Freq: Two times a day (BID) | SUBCUTANEOUS | Status: DC
  Administered 2019-10-21 – 2019-10-22 (×3): 30 [IU] via SUBCUTANEOUS
  Filled 2019-10-21 (×5): qty 0.3

## 2019-10-21 MED ORDER — INSULIN ASPART 100 UNIT/ML ~~LOC~~ SOLN
10.0000 [IU] | Freq: Three times a day (TID) | SUBCUTANEOUS | Status: DC
  Administered 2019-10-21 – 2019-10-23 (×8): 10 [IU] via SUBCUTANEOUS

## 2019-10-21 MED ORDER — INSULIN GLARGINE 100 UNIT/ML ~~LOC~~ SOLN: 25.0000 [IU] | Freq: Two times a day (BID) | SUBCUTANEOUS | Status: DC

## 2019-10-21 NOTE — Progress Notes (Signed)
[facility] PROGRESS NOTE    Progress Note  Steven Holmes  NWG:[id number] DOB: [birth year] DOA: 10/15/2019 PCP: [facility]     Brief Narrative:   Steven Holmes is an 58 y.o. male past medical history of diabetes mellitus type 2 with complications, essential hypertension obstructive sleep apnea/obesity hypoventilation syndrome, morbid obesity resented to the emergency room with several days history of shortness of breath in the ED was found hypoxic in the 80s was placed on nonrebreather and saturations improved to the 90s.  Covid 2 was positive on 10/15/2019 , started on empiric IV remdesivir and steroids and transferred to [facility].  Assessment/Plan:   Acute respiratory failure with hypoxia due to pneumonia due to COVID-19: Mr. All-time this morning is only requiring 4 L of high flow nasal cannula to keep saturations greater than 94%. He has completed his 5-day course of IV remdesivir, continue Decadron for total of 10 days. Continue vitamin C and zinc. He did receive Actemra on 10/15/2019. We will stop checking inflammatory markers. Physical therapy evaluation is pending.  Obstructive sleep apnea/obesity hypoventilation syndrome: Stable.  Essential hypertension: Controlled without medications continue to monitor closely.  Diabetes mellitus type 2 uncontrolled with complications/hyperosmolar hyperglycemic nonketotic syndrome: Last A1c of 9.3 blood glucose continues to be significantly elevated, increase long-acting insulin increase his meal coverage.    DVT prophylaxis: lovenox Family Communication:none Disposition Plan/Barrier to D/C: unable to determine Code Status:     Code Status Orders  (From admission, onward)         Start     Ordered   10/15/19 1015  Full code  Continuous     10/15/19 1022        Code Status History    Date Active Date Inactive Code Status Order ID Comments User Context   10/14/2019 2305 10/15/2019 0937 Full  Code [id number]  Andris Baumann, MD ED   Advance Care Planning Activity        IV Access:    Peripheral IV   Procedures and diagnostic studies:   No results found.   Medical Consultants:    None.  Anti-Infectives:   IV remdesivir  Subjective:    [name] relates his breathing is significantly better.  Objective:    Vitals:   10/21/19 0414 10/21/19 0500 10/21/19 0600 10/21/19 0700  BP: 130/83     Pulse: 65 (!) 59 (!) 58 (!) 57  Resp: 20 17 18 17   Temp: (!) 97.3 F (36.3 C)     TempSrc: Oral     SpO2: 93% 95% 94% 94%  Weight:      Height:       SpO2: 94 % O2 Flow Rate (L/min): 4 L/min FiO2 (%): (S) 100 %   Intake/Output Summary (Last 24 hours) at 10/21/2019 0738 Last data filed at 10/21/2019 0200 Gross per 24 hour  Intake 840 ml  Output 2150 ml  Net -1310 ml   Filed Weights   10/15/19 1000  Weight: 108.7 kg    Exam: General exam: In no acute distress. Respiratory system: Good air movement and seems like his crackles are diminished and less diffuse seems to be improving. Cardiovascular system: S1 & S2 heard, RRR. No JVD. Gastrointestinal system: Abdomen is nondistended, soft and nontender.  Central nervous system: Alert and oriented. No focal neurological deficits. Extremities: No pedal edema. Skin: No rashes, lesions or ulcers Psychiatry: Judgement and insight appear normal. Mood & affect appropriate.   Data Reviewed:  Labs: Basic Metabolic Panel: Recent Labs  Lab 10/16/19 0310 10/17/19 0643 10/18/19 0643 10/19/19 0100 10/19/19 2121 10/20/19 0225  NA 141 143 141 139  --  136  K 4.1 4.0 4.4 4.4  --  4.2  CL 106 104 102 99  --  94*  CO2 27 28 29 27   --  29  GLUCOSE 149* 215* 177* 246* 360* 211*  BUN 24* 28* 24* 25*  --  27*  CREATININE 0.55* 0.84 0.76 0.70  --  0.67  CALCIUM 8.7* 8.9 8.6* 8.8*  --  8.9  MG 2.3 2.2 2.0 2.0  --  2.1  PHOS 3.4 4.2 4.8* 4.7*  --  4.3   GFR Estimated Creatinine Clearance: 120.3  mL/min (by C-G formula based on SCr of 0.67 mg/dL). Liver Function Tests: Recent Labs  Lab 10/16/19 0310 10/17/19 0643 10/18/19 0643 10/19/19 0100 10/20/19 0225  AST 31 20 19 22 22   ALT 30 29 27 30  37  ALKPHOS 60 64 60 61 63  BILITOT 0.6 0.6 0.5 0.8 0.6  PROT 6.4* 6.3* 6.0* 6.2* 6.2*  ALBUMIN 2.8* 2.7* 2.7* 3.0* 2.9*   No results for input(s): LIPASE, AMYLASE in the last 168 hours. No results for input(s): AMMONIA in the last 168 hours. Coagulation profile No results for input(s): INR, PROTIME in the last 168 hours. COVID-19 Labs  Recent Labs    10/19/19 0100 10/20/19 0225  DDIMER 0.62* 0.60*  FERRITIN 490* 488*  CRP 1.4* 0.6    Lab Results  Component Value Date   SARSCOV2NAA POSITIVE (A) 10/15/2019    CBC: Recent Labs  Lab 10/16/19 0310 10/17/19 0643 10/18/19 0643 10/19/19 0100 10/20/19 0225  WBC 6.9 6.2 5.5 5.4 5.8  NEUTROABS 5.6 4.8 4.1 4.0 4.6  HGB 13.9 14.0 14.0 14.7 14.9  HCT 41.5 41.7 41.2 44.2 44.1  MCV 93.9 94.1 93.2 94.0 92.8  PLT 181 196 192 200 201   Cardiac Enzymes: No results for input(s): CKTOTAL, CKMB, CKMBINDEX, TROPONINI in the last 168 hours. BNP (last 3 results) No results for input(s): PROBNP in the last 8760 hours. CBG: Recent Labs  Lab 10/20/19 1947 10/20/19 2200 10/20/19 2319 10/20/19 2355 10/21/19 0419  GLUCAP 467* 376* 422* 348* 314*   D-Dimer: Recent Labs    10/19/19 0100 10/20/19 0225  DDIMER 0.62* 0.60*   Hgb A1c: No results for input(s): HGBA1C in the last 72 hours. Lipid Profile: No results for input(s): CHOL, HDL, LDLCALC, TRIG, CHOLHDL, LDLDIRECT in the last 72 hours. Thyroid function studies: No results for input(s): TSH, T4TOTAL, T3FREE, THYROIDAB in the last 72 hours.  Invalid input(s): FREET3 Anemia work up: Recent Labs    10/19/19 0100 10/20/19 0225  FERRITIN 490* 488*   Sepsis Labs: Recent Labs  Lab 10/14/19 2023 10/15/19 1100 10/17/19 0643 10/18/19 0643 10/19/19 0100 10/20/19 0225   PROCALCITON <0.10 <0.10  --   --   --   --   WBC 5.5 4.5 6.2 5.5 5.4 5.8  LATICACIDVEN 1.6  --   --   --   --   --    Microbiology Recent Results (from the past 240 hour(s))  Culture, blood (routine x 2)     Status: None   Collection Time: 10/15/19 12:40 AM   Specimen: BLOOD RIGHT HAND  Result Value Ref Range Status   Specimen Description BLOOD RIGHT HAND  Final   Special Requests   Final    BOTTLES DRAWN AEROBIC AND ANAEROBIC Blood Culture adequate volume  Culture   Final    NO GROWTH 5 DAYS Performed at [facility], [address] [place], [place] [address]    Report Status 10/20/2019 FINAL  Final  Culture, blood (routine x 2)     Status: None   Collection Time: 10/15/19 12:40 AM   Specimen: BLOOD RIGHT HAND  Result Value Ref Range Status   Specimen Description BLOOD RIGHT HAND  Final   Special Requests   Final    BOTTLES DRAWN AEROBIC AND ANAEROBIC Blood Culture adequate volume   Culture   Final    NO GROWTH 5 DAYS Performed at [facility], [address] [place], [place] [address]    Report Status 10/20/2019 FINAL  Final  Respiratory Panel by PCR     Status: None   Collection Time: 10/15/19 12:40 AM   Specimen: Nasopharyngeal Swab; Respiratory  Result Value Ref Range Status   Adenovirus NOT DETECTED NOT DETECTED Final   Coronavirus 229E NOT DETECTED NOT DETECTED Final    Comment: (NOTE) The Coronavirus on the Respiratory Panel, DOES NOT test for the novel  Coronavirus (2019 nCoV)    Coronavirus HKU1 NOT DETECTED NOT DETECTED Final   Coronavirus NL63 NOT DETECTED NOT DETECTED Final   Coronavirus OC43 NOT DETECTED NOT DETECTED Final   Metapneumovirus NOT DETECTED NOT DETECTED Final   Rhinovirus / Enterovirus NOT DETECTED NOT DETECTED Final   Influenza A NOT DETECTED NOT DETECTED Final   Influenza B NOT DETECTED NOT DETECTED Final   Parainfluenza Virus 1 NOT DETECTED NOT DETECTED Final   Parainfluenza Virus 2 NOT DETECTED NOT DETECTED Final    Parainfluenza Virus 3 NOT DETECTED NOT DETECTED Final   Parainfluenza Virus 4 NOT DETECTED NOT DETECTED Final   Respiratory Syncytial Virus NOT DETECTED NOT DETECTED Final   Bordetella pertussis NOT DETECTED NOT DETECTED Final   Chlamydophila pneumoniae NOT DETECTED NOT DETECTED Final   Mycoplasma pneumoniae NOT DETECTED NOT DETECTED Final    Comment: Performed at [facility], [address] [place], [place] [address]  Respiratory Panel by PCR     Status: None   Collection Time: 10/15/19  3:15 PM  Result Value Ref Range Status   Adenovirus NOT DETECTED NOT DETECTED Final   Coronavirus 229E NOT DETECTED NOT DETECTED Final    Comment: (NOTE) The Coronavirus on the Respiratory Panel, DOES NOT test for the novel  Coronavirus (2019 nCoV)    Coronavirus HKU1 NOT DETECTED NOT DETECTED Final   Coronavirus NL63 NOT DETECTED NOT DETECTED Final   Coronavirus OC43 NOT DETECTED NOT DETECTED Final   Metapneumovirus NOT DETECTED NOT DETECTED Final   Rhinovirus / Enterovirus NOT DETECTED NOT DETECTED Final   Influenza A NOT DETECTED NOT DETECTED Final   Influenza B NOT DETECTED NOT DETECTED Final   Parainfluenza Virus 1 NOT DETECTED NOT DETECTED Final   Parainfluenza Virus 2 NOT DETECTED NOT DETECTED Final   Parainfluenza Virus 3 NOT DETECTED NOT DETECTED Final   Parainfluenza Virus 4 NOT DETECTED NOT DETECTED Final   Respiratory Syncytial Virus NOT DETECTED NOT DETECTED Final   Bordetella pertussis NOT DETECTED NOT DETECTED Final   Chlamydophila pneumoniae NOT DETECTED NOT DETECTED Final   Mycoplasma pneumoniae NOT DETECTED NOT DETECTED Final    Comment: Performed at [facility], [address], [place], [place] [address]  MRSA PCR Screening     Status: None   Collection Time: 10/15/19  3:35 PM  Result Value Ref Range Status   MRSA  by PCR NEGATIVE NEGATIVE Final    Comment:        The GeneXpert MRSA Assay (FDA approved for NASAL specimens only), is one component of  a comprehensive MRSA colonization surveillance program. It is not intended to diagnose MRSA infection nor to guide or monitor treatment for MRSA infections. Performed at [facility], [place] [address], [place], [place] [address]   SARS CORONAVIRUS 2 (TAT 6-24 HRS) Nasopharyngeal Nasopharyngeal Swab     Status: Abnormal   Collection Time: 10/15/19  4:30 PM   Specimen: Nasopharyngeal Swab  Result Value Ref Range Status   SARS Coronavirus 2 POSITIVE (A) NEGATIVE Final    Comment: RESULT CALLED TO, READ BACK BY AND VERIFIED WITH: MORGAN SCHDIMT RN.@[email or web address]  ON 12.13.2020 BY TCALDWELL MT. (NOTE) SARS-CoV-2 target nucleic acids are DETECTED. The SARS-CoV-2 RNA is generally detectable in upper and lower respiratory specimens during the acute phase of infection. Positive results are indicative of the presence of SARS-CoV-2 RNA. Clinical correlation with patient history and other diagnostic information is  necessary to determine patient infection status. Positive results do not rule out bacterial infection or co-infection with other viruses.  The expected result is Negative. Fact Sheet for Patients: SugarRoll.be Fact Sheet for Healthcare Providers: https://www.woods-mathews.com/ This test is not yet approved or cleared by the [place] FDA and  has been authorized for detection and/or diagnosis of SARS-CoV-2 by FDA under an Emergency Use Authorization (EUA). This EUA will remain  in effect (meaning this test c an be used) for the duration of the COVID-19 declaration under Section 564(b)(1) of the Act, 21 U.S.C. section 360bbb-3(b)(1), unless the authorization is terminated or revoked sooner. Performed at [facility], [place] [address] [place], [place] [address]      Medications:   . aspirin EC  81 mg Oral Daily  . Chlorhexidine Gluconate Cloth  6 each Topical Daily  . dexamethasone (DECADRON) injection  8 mg  Intravenous Q24H  . enoxaparin (LOVENOX) injection  50 mg Subcutaneous Q24H  . feeding supplement (PRO-STAT SUGAR FREE 64)  30 mL Oral BID  . folic acid  1 mg Oral Daily  . insulin aspart  0-20 Units Subcutaneous TID WC  . insulin aspart  0-5 Units Subcutaneous QHS  . insulin aspart  4 Units Subcutaneous TID WC  . insulin glargine  15 Units Subcutaneous BID  . Ipratropium-Albuterol  1 puff Inhalation Q6H  . mouth rinse  15 mL Mouth Rinse BID  . thiamine  100 mg Oral Daily  . vitamin C  500 mg Oral Daily  . zinc sulfate  220 mg Oral Daily   Continuous Infusions: . sodium chloride Stopped (10/15/19 1157)      LOS: 6 days   Charlynne Cousins  [facility]  10/21/2019, 7:38 AM

## 2019-10-21 NOTE — Plan of Care (Signed)
  Problem: Education: Goal: Knowledge of risk factors and measures for prevention of condition will improve Outcome: Progressing   Problem: Coping: Goal: Psychosocial and spiritual needs will be supported Outcome: Progressing   Problem: Respiratory: Goal: Will maintain a patent airway Outcome: Progressing Goal: Complications related to the disease process, condition or treatment will be avoided or minimized Outcome: Progressing   Problem: Education: Goal: Knowledge of General Education information will improve Description: Including pain rating scale, medication(s)/side effects and non-pharmacologic comfort measures Outcome: Progressing   Problem: Clinical Measurements: Goal: Ability to maintain clinical measurements within normal limits will improve Outcome: Progressing Goal: Will remain free from infection Outcome: Progressing Goal: Diagnostic test results will improve Outcome: Progressing Goal: Respiratory complications will improve Outcome: Progressing Goal: Cardiovascular complication will be avoided Outcome: Progressing   Problem: Activity: Goal: Risk for activity intolerance will decrease Outcome: Progressing   Problem: Nutrition: Goal: Adequate nutrition will be maintained Outcome: Progressing   Problem: Coping: Goal: Level of anxiety will decrease Outcome: Progressing   Problem: Elimination: Goal: Will not experience complications related to bowel motility Outcome: Progressing Goal: Will not experience complications related to urinary retention Outcome: Progressing   Problem: Pain Managment: Goal: General experience of comfort will improve Outcome: Progressing   Problem: Safety: Goal: Ability to remain free from injury will improve Outcome: Progressing   Problem: Skin Integrity: Goal: Risk for impaired skin integrity will decrease Outcome: Progressing   Problem: Metabolic: Goal: Ability to maintain appropriate glucose levels will  improve Outcome: Progressing

## 2019-10-21 NOTE — Plan of Care (Signed)
Sent page to provider pt bs [phone].

## 2019-10-21 NOTE — Plan of Care (Signed)
Spoke with pt wife Rip Harbour. Provided updated and answered questions.

## 2019-10-21 NOTE — Plan of Care (Signed)
  Problem: Education: Goal: Knowledge of risk factors and measures for prevention of condition will improve Outcome: Progressing   Problem: Coping: Goal: Psychosocial and spiritual needs will be supported Outcome: Progressing   Problem: Respiratory: Goal: Will maintain a patent airway Outcome: Progressing Goal: Complications related to the disease process, condition or treatment will be avoided or minimized Outcome: Progressing   Problem: Education: Goal: Knowledge of General Education information will improve Description: Including pain rating scale, medication(s)/side effects and non-pharmacologic comfort measures Outcome: Progressing   Problem: Health Behavior/Discharge Planning: Goal: Ability to manage health-related needs will improve Outcome: Progressing   Problem: Clinical Measurements: Goal: Ability to maintain clinical measurements within normal limits will improve Outcome: Progressing Goal: Will remain free from infection Outcome: Progressing Goal: Diagnostic test results will improve Outcome: Progressing Goal: Respiratory complications will improve Outcome: Progressing Goal: Cardiovascular complication will be avoided Outcome: Progressing   Problem: Activity: Goal: Risk for activity intolerance will decrease Outcome: Progressing   Problem: Nutrition: Goal: Adequate nutrition will be maintained Outcome: Progressing   Problem: Coping: Goal: Level of anxiety will decrease Outcome: Progressing   Problem: Elimination: Goal: Will not experience complications related to bowel motility Outcome: Progressing Goal: Will not experience complications related to urinary retention Outcome: Progressing   Problem: Pain Managment: Goal: General experience of comfort will improve Outcome: Progressing   Problem: Safety: Goal: Ability to remain free from injury will improve Outcome: Progressing   Problem: Skin Integrity: Goal: Risk for impaired skin integrity will  decrease Outcome: Progressing   Problem: Metabolic: Goal: Ability to maintain appropriate glucose levels will improve Outcome: Progressing   

## 2019-10-21 NOTE — Progress Notes (Addendum)
Pt resting in bed with no complaints. FSBS 459, advised Dr Aileen Fass. No new orders. Stat glucose,blood ordered per MD standing order.

## 2019-10-21 NOTE — Progress Notes (Signed)
Steven Holmes has had a good day today with exception of his blood glucose levels still remaining high dur to regular foods and steroids. His vitals signs are stable and the plan is to discontinue his cardiac monitor and transfer him to our medical floor when appropriate. He is independent in his care and is maintaining his Incentive spirometry at 1700 x5 repetitions today. He is responding well to treatment and following directions. Rn will give report when pt's room is clean and ready to receive him.

## 2019-10-22 LAB — GLUCOSE, CAPILLARY
Glucose-Capillary: 356 mg/dL — ABNORMAL HIGH (ref 70–99)
Glucose-Capillary: 329 mg/dL — ABNORMAL HIGH (ref 70–99)
Glucose-Capillary: 343 mg/dL — ABNORMAL HIGH (ref 70–99)

## 2019-10-22 LAB — CREATININE, SERUM
Creatinine, Ser: 0.86 mg/dL (ref 0.61–1.24)
GFR calc Af Amer: >60 mL/min (ref >60)
GFR calc non Af Amer: >60 mL/min (ref >60)

## 2019-10-22 MED ORDER — INSULIN GLARGINE 100 UNIT/ML ~~LOC~~ SOLN
40.0000 [IU] | Freq: Two times a day (BID) | SUBCUTANEOUS | Status: DC
  Administered 2019-10-22 – 2019-10-23 (×2): 40 [IU] via SUBCUTANEOUS
  Filled 2019-10-22 (×2): qty 0.4

## 2019-10-22 MED ORDER — INSULIN DETEMIR 100 UNIT/ML ~~LOC~~ SOLN
20.0000 [IU] | Freq: Once | SUBCUTANEOUS | Status: AC
  Administered 2019-10-22: 13:00:00 20 [IU] via SUBCUTANEOUS
  Filled 2019-10-22: qty 0.2

## 2019-10-22 MED ORDER — DEXAMETHASONE SODIUM PHOSPHATE 10 MG/ML IJ SOLN
4.0000 mg | INTRAMUSCULAR | Status: DC
  Administered 2019-10-22: 4 mg via INTRAVENOUS
  Filled 2019-10-22: qty 1

## 2019-10-22 NOTE — Progress Notes (Signed)
Pt sitting up in bed. Pt has good appetite and ate 85% of breakfast. NAD. Pt now on RA and sats 94%. VSS. Urine is yellow and malodorous. Will continue to monitor.

## 2019-10-22 NOTE — Progress Notes (Signed)
[facility] PROGRESS NOTE    Progress Note  Steven ReidKenneth Durand Roseboom  BJY:[id number] DOB: 08/23/1961 DOA: 10/15/2019 [facility]     Brief Narrative:   Steven Holmes is an 58 y.o. male past medical history of diabetes mellitus type 2 with complications, essential hypertension obstructive sleep apnea/obesity hypoventilation syndrome, morbid obesity resented to the emergency room with several days history of shortness of breath in the ED was found hypoxic in the 80s was placed on nonrebreather and saturations improved to the 90s.  Covid 2 was positive on 10/15/2019 , started on empiric IV remdesivir and steroids and transferred to [facility].  Assessment/Plan:   Acute respiratory failure with hypoxia due to pneumonia due to COVID-19: He is currently current 2 L of oxygen to keep saturations greater than 94% overnight, this morning when I went into the room he has been off oxygen for the last 30 minutes and the nurse was watching me him and he relates he feels okay, his saturations have remained greater than 95% on room air. He is complete his 5-day course of IV remdesivir, will continue Decadron for total of 10 days. Continue vitamin C and zinc, he did receive Actemra on 10/23/2019. We will watch him for an additional 24 hours we can probably discharge him in the morning.  Obstructive sleep apnea/obesity hypoventilation syndrome: Stable.  Essential hypertension: Controlled without medications continue to monitor closely.  Diabetes mellitus type 2 uncontrolled with complications/hyperosmolar hyperglycemic nonketotic syndrome: A1c of 9.3, his blood glucose was significantly elevated overnight, CBGs morning are pending.  Continue sliding scale and long-acting insulin, will start to titrate down dexamethasone   DVT prophylaxis: lovenox Family Communication:none Disposition Plan/Barrier to D/C: unable to determine Code Status:     Code Status Orders  (From  admission, onward)         Start     Ordered   10/15/19 1015  Full code  Continuous     10/15/19 1022        Code Status History    Date Active Date Inactive Code Status Order ID Comments User Context   10/14/2019 2305 10/15/2019 0937 Full Code [id number]  Andris Baumannuncan, Hazel V, MD ED   Advance Care Planning Activity        IV Access:    Peripheral IV   Procedures and diagnostic studies:   No results found.   Medical Consultants:    None.  Anti-Infectives:   IV remdesivir  Subjective:    Steven ReidKenneth Durand Persaud relates his breathing continues to improve he feels he is close to baseline breathing wise.  Objective:    Vitals:   10/21/19 1551 10/21/19 1951 10/22/19 0306 10/22/19 0806  BP:  119/78 119/81 138/84  Pulse:  71 74 60  Resp:  20 18 19   Temp:  97.7 F (36.5 C) 97.8 F (36.6 C) 97.6 F (36.4 C)  TempSrc:  Oral Oral Oral  SpO2: 95% 94% 94% 94%  Weight:      Height:       SpO2: 94 % O2 Flow Rate (L/min): 2 L/min FiO2 (%): (S) 100 %   Intake/Output Summary (Last 24 hours) at 10/22/2019 0849 Last data filed at 10/22/2019 0807 Gross per 24 hour  Intake 1080 ml  Output 2825 ml  Net -1745 ml   Filed Weights   10/15/19 1000  Weight: 108.7 kg    Exam: General exam: In no acute distress. Respiratory system: Good air movement and clear to auscultation. Cardiovascular system: S1 &  S2 heard, RRR. No JVD. Gastrointestinal system: Abdomen is nondistended, soft and nontender.  Central nervous system: Alert and oriented. No focal neurological deficits. Extremities: No pedal edema. Skin: No rashes, lesions or ulcers Psychiatry: Judgement and insight appear normal. Mood & affect appropriate.    Data Reviewed:    Labs: Basic Metabolic Panel: Recent Labs  Lab 10/16/19 0310 10/17/19 7893 10/18/19 0643 10/19/19 0100 10/19/19 2121 10/20/19 0225 10/21/19 1708 10/22/19 0545  NA 141 143 141 139  --  136  --   --   K 4.1 4.0 4.4 4.4  --  4.2   --   --   CL 106 104 102 99  --  94*  --   --   CO2 27 28 29 27   --  29  --   --   GLUCOSE 149* 215* 177* 246* 360* 211* 397*  --   BUN 24* 28* 24* 25*  --  27*  --   --   CREATININE 0.55* 0.84 0.76 0.70  --  0.67  --  0.86  CALCIUM 8.7* 8.9 8.6* 8.8*  --  8.9  --   --   MG 2.3 2.2 2.0 2.0  --  2.1  --   --   PHOS 3.4 4.2 4.8* 4.7*  --  4.3  --   --    GFR Estimated Creatinine Clearance: 111.9 mL/min (by C-G formula based on SCr of 0.86 mg/dL). Liver Function Tests: Recent Labs  Lab 10/16/19 0310 10/17/19 0643 10/18/19 0643 10/19/19 0100 10/20/19 0225  AST 31 20 19 22 22   ALT 30 29 27 30  37  ALKPHOS 60 64 60 61 63  BILITOT 0.6 0.6 0.5 0.8 0.6  PROT 6.4* 6.3* 6.0* 6.2* 6.2*  ALBUMIN 2.8* 2.7* 2.7* 3.0* 2.9*   No results for input(s): LIPASE, AMYLASE in the last 168 hours. No results for input(s): AMMONIA in the last 168 hours. Coagulation profile No results for input(s): INR, PROTIME in the last 168 hours. COVID-19 Labs  Recent Labs    10/20/19 0225  DDIMER 0.60*  FERRITIN 488*  CRP 0.6    Lab Results  Component Value Date   SARSCOV2NAA POSITIVE (A) 10/15/2019    CBC: Recent Labs  Lab 10/16/19 0310 10/17/19 0643 10/18/19 0643 10/19/19 0100 10/20/19 0225  WBC 6.9 6.2 5.5 5.4 5.8  NEUTROABS 5.6 4.8 4.1 4.0 4.6  HGB 13.9 14.0 14.0 14.7 14.9  HCT 41.5 41.7 41.2 44.2 44.1  MCV 93.9 94.1 93.2 94.0 92.8  PLT 181 196 192 200 201   Cardiac Enzymes: No results for input(s): CKTOTAL, CKMB, CKMBINDEX, TROPONINI in the last 168 hours. BNP (last 3 results) No results for input(s): PROBNP in the last 8760 hours. CBG: Recent Labs  Lab 10/20/19 2355 10/21/19 0419 10/21/19 0733 10/21/19 1127 10/21/19 1607  GLUCAP 348* 314* 260* 329* 453*   D-Dimer: Recent Labs    10/20/19 0225  DDIMER 0.60*   Hgb A1c: No results for input(s): HGBA1C in the last 72 hours. Lipid Profile: No results for input(s): CHOL, HDL, LDLCALC, TRIG, CHOLHDL, LDLDIRECT in the last 72  hours. Thyroid function studies: No results for input(s): TSH, T4TOTAL, T3FREE, THYROIDAB in the last 72 hours.  Invalid input(s): FREET3 Anemia work up: Recent Labs    10/20/19 0225  FERRITIN 488*   Sepsis Labs: Recent Labs  Lab 10/15/19 1100 10/17/19 0643 10/18/19 0643 10/19/19 0100 10/20/19 0225  PROCALCITON <0.10  --   --   --   --  WBC 4.5 6.2 5.5 5.4 5.8   Microbiology Recent Results (from the past 240 hour(s))  Culture, blood (routine x 2)     Status: None   Collection Time: 10/15/19 12:40 AM   Specimen: BLOOD RIGHT HAND  Result Value Ref Range Status   Specimen Description BLOOD RIGHT HAND  Final   Special Requests   Final    BOTTLES DRAWN AEROBIC AND ANAEROBIC Blood Culture adequate volume   Culture   Final    NO GROWTH 5 DAYS Performed at [facility], [address] [place], [place] [address]    Report Status 10/20/2019 FINAL  Final  Culture, blood (routine x 2)     Status: None   Collection Time: 10/15/19 12:40 AM   Specimen: BLOOD RIGHT HAND  Result Value Ref Range Status   Specimen Description BLOOD RIGHT HAND  Final   Special Requests   Final    BOTTLES DRAWN AEROBIC AND ANAEROBIC Blood Culture adequate volume   Culture   Final    NO GROWTH 5 DAYS Performed at [facility], [address] [place], [place] [address]    Report Status 10/20/2019 FINAL  Final  Respiratory Panel by PCR     Status: None   Collection Time: 10/15/19 12:40 AM   Specimen: Nasopharyngeal Swab; Respiratory  Result Value Ref Range Status   Adenovirus NOT DETECTED NOT DETECTED Final   Coronavirus 229E NOT DETECTED NOT DETECTED Final    Comment: (NOTE) The Coronavirus on the Respiratory Panel, DOES NOT test for the novel  Coronavirus (2019 nCoV)    Coronavirus HKU1 NOT DETECTED NOT DETECTED Final   Coronavirus NL63 NOT DETECTED NOT DETECTED Final   Coronavirus OC43 NOT DETECTED NOT DETECTED Final   Metapneumovirus NOT DETECTED NOT DETECTED Final    Rhinovirus / Enterovirus NOT DETECTED NOT DETECTED Final   Influenza A NOT DETECTED NOT DETECTED Final   Influenza B NOT DETECTED NOT DETECTED Final   Parainfluenza Virus 1 NOT DETECTED NOT DETECTED Final   Parainfluenza Virus 2 NOT DETECTED NOT DETECTED Final   Parainfluenza Virus 3 NOT DETECTED NOT DETECTED Final   Parainfluenza Virus 4 NOT DETECTED NOT DETECTED Final   Respiratory Syncytial Virus NOT DETECTED NOT DETECTED Final   Bordetella pertussis NOT DETECTED NOT DETECTED Final   Chlamydophila pneumoniae NOT DETECTED NOT DETECTED Final   Mycoplasma pneumoniae NOT DETECTED NOT DETECTED Final    Comment: Performed at [facility], [place] [address] [place], [place] [address]  Respiratory Panel by PCR     Status: None   Collection Time: 10/15/19  3:15 PM  Result Value Ref Range Status   Adenovirus NOT DETECTED NOT DETECTED Final   Coronavirus 229E NOT DETECTED NOT DETECTED Final    Comment: (NOTE) The Coronavirus on the Respiratory Panel, DOES NOT test for the novel  Coronavirus (2019 nCoV)    Coronavirus HKU1 NOT DETECTED NOT DETECTED Final   Coronavirus NL63 NOT DETECTED NOT DETECTED Final   Coronavirus OC43 NOT DETECTED NOT DETECTED Final   Metapneumovirus NOT DETECTED NOT DETECTED Final   Rhinovirus / Enterovirus NOT DETECTED NOT DETECTED Final   Influenza A NOT DETECTED NOT DETECTED Final   Influenza B NOT DETECTED NOT DETECTED Final   Parainfluenza Virus 1 NOT DETECTED NOT DETECTED Final   Parainfluenza Virus 2 NOT DETECTED NOT DETECTED Final   Parainfluenza Virus 3 NOT DETECTED NOT DETECTED Final   Parainfluenza Virus 4 NOT DETECTED NOT DETECTED Final   Respiratory Syncytial  Virus NOT DETECTED NOT DETECTED Final   Bordetella pertussis NOT DETECTED NOT DETECTED Final   Chlamydophila pneumoniae NOT DETECTED NOT DETECTED Final   Mycoplasma pneumoniae NOT DETECTED NOT DETECTED Final    Comment: Performed at [facility], [address], [place], [place] [address]   MRSA PCR Screening     Status: None   Collection Time: 10/15/19  3:35 PM  Result Value Ref Range Status   MRSA by PCR NEGATIVE NEGATIVE Final    Comment:        The GeneXpert MRSA Assay (FDA approved for NASAL specimens only), is one component of a comprehensive MRSA colonization surveillance program. It is not intended to diagnose MRSA infection nor to guide or monitor treatment for MRSA infections. Performed at [facility], [address] [place], [place] [address]   SARS CORONAVIRUS 2 (TAT 6-24 HRS) Nasopharyngeal Nasopharyngeal Swab     Status: Abnormal   Collection Time: 10/15/19  4:30 PM   Specimen: Nasopharyngeal Swab  Result Value Ref Range Status   SARS Coronavirus 2 POSITIVE (A) NEGATIVE Final    Comment: RESULT CALLED TO, READ BACK BY AND VERIFIED WITH: MORGAN SCHDIMT RN.@[email or web address]  ON 12.13.2020 BY TCALDWELL MT. (NOTE) SARS-CoV-2 target nucleic acids are DETECTED. The SARS-CoV-2 RNA is generally detectable in upper and lower respiratory specimens during the acute phase of infection. Positive results are indicative of the presence of SARS-CoV-2 RNA. Clinical correlation with patient history and other diagnostic information is  necessary to determine patient infection status. Positive results do not rule out bacterial infection or co-infection with other viruses.  The expected result is Negative. Fact Sheet for Patients: HairSlick.no Fact Sheet for Healthcare Providers: quierodirigir.com This test is not yet approved or cleared by the [place] FDA and  has been authorized for detection and/or diagnosis of SARS-CoV-2 by FDA under an Emergency Use Authorization (EUA). This EUA will remain  in effect (meaning this test c an be used) for the duration of the COVID-19 declaration under Section 564(b)(1) of the Act, 21 U.S.C. section 360bbb-3(b)(1), unless the authorization is terminated or revoked  sooner. Performed at [facility], [address], [place], [place] [address]      Medications:   . aspirin EC  81 mg Oral Daily  . Chlorhexidine Gluconate Cloth  6 each Topical Daily  . dexamethasone (DECADRON) injection  6 mg Intravenous Q24H  . enoxaparin (LOVENOX) injection  50 mg Subcutaneous Q24H  . feeding supplement (PRO-STAT SUGAR FREE 64)  30 mL Oral BID  . folic acid  1 mg Oral Daily  . insulin aspart  0-20 Units Subcutaneous TID WC  . insulin aspart  0-5 Units Subcutaneous QHS  . insulin aspart  10 Units Subcutaneous TID WC  . insulin glargine  30 Units Subcutaneous BID  . Ipratropium-Albuterol  1 puff Inhalation Q6H  . mouth rinse  15 mL Mouth Rinse BID  . thiamine  100 mg Oral Daily  . vitamin C  500 mg Oral Daily  . zinc sulfate  220 mg Oral Daily   Continuous Infusions: . sodium chloride Stopped (10/15/19 1157)      LOS: 7 days   Marinda Elk  [facility]  10/22/2019, 8:49 AM

## 2019-10-22 NOTE — Progress Notes (Addendum)
Pt on RA and sats maintaining WNL. No complaints at this time. Pt has called family with update and nurse not needed to call per pt.

## 2019-10-22 NOTE — Plan of Care (Signed)
  Problem: Education: Goal: Knowledge of risk factors and measures for prevention of condition will improve Outcome: Progressing   Problem: Coping: Goal: Psychosocial and spiritual needs will be supported Outcome: Progressing   Problem: Respiratory: Goal: Will maintain a patent airway Outcome: Progressing Goal: Complications related to the disease process, condition or treatment will be avoided or minimized Outcome: Progressing   Problem: Education: Goal: Knowledge of General Education information will improve Description: Including pain rating scale, medication(s)/side effects and non-pharmacologic comfort measures Outcome: Progressing   Problem: Health Behavior/Discharge Planning: Goal: Ability to manage health-related needs will improve Outcome: Progressing   Problem: Clinical Measurements: Goal: Ability to maintain clinical measurements within normal limits will improve Outcome: Progressing Goal: Will remain free from infection Outcome: Progressing Goal: Diagnostic test results will improve Outcome: Progressing Goal: Respiratory complications will improve Outcome: Progressing Goal: Cardiovascular complication will be avoided Outcome: Progressing   Problem: Activity: Goal: Risk for activity intolerance will decrease Outcome: Progressing   Problem: Nutrition: Goal: Adequate nutrition will be maintained Outcome: Progressing   Problem: Coping: Goal: Level of anxiety will decrease Outcome: Progressing   Problem: Elimination: Goal: Will not experience complications related to bowel motility Outcome: Progressing Goal: Will not experience complications related to urinary retention Outcome: Progressing   Problem: Pain Managment: Goal: General experience of comfort will improve Outcome: Progressing   Problem: Safety: Goal: Ability to remain free from injury will improve Outcome: Progressing   Problem: Skin Integrity: Goal: Risk for impaired skin integrity will  decrease Outcome: Progressing   Problem: Metabolic: Goal: Ability to maintain appropriate glucose levels will improve Outcome: Progressing   

## 2019-10-22 NOTE — Plan of Care (Signed)
  Problem: Education: Goal: Knowledge of risk factors and measures for prevention of condition will improve Outcome: Progressing   Problem: Coping: Goal: Psychosocial and spiritual needs will be supported Outcome: Progressing   Problem: Respiratory: Goal: Will maintain a patent airway Outcome: Progressing Goal: Complications related to the disease process, condition or treatment will be avoided or minimized Outcome: Progressing   Problem: Education: Goal: Knowledge of General Education information will improve Description: Including pain rating scale, medication(s)/side effects and non-pharmacologic comfort measures Outcome: Progressing   Problem: Health Behavior/Discharge Planning: Goal: Ability to manage health-related needs will improve Outcome: Progressing   Problem: Clinical Measurements: Goal: Ability to maintain clinical measurements within normal limits will improve Outcome: Progressing Goal: Will remain free from infection Outcome: Progressing Goal: Diagnostic test results will improve Outcome: Progressing Goal: Respiratory complications will improve Outcome: Progressing Goal: Cardiovascular complication will be avoided Outcome: Progressing   Problem: Activity: Goal: Risk for activity intolerance will decrease Outcome: Progressing   Problem: Nutrition: Goal: Adequate nutrition will be maintained Outcome: Progressing   Problem: Coping: Goal: Level of anxiety will decrease Outcome: Progressing   Problem: Elimination: Goal: Will not experience complications related to bowel motility Outcome: Progressing Goal: Will not experience complications related to urinary retention Outcome: Progressing   Problem: Pain Managment: Goal: General experience of comfort will improve Outcome: Progressing   Problem: Safety: Goal: Ability to remain free from injury will improve Outcome: Progressing   Problem: Skin Integrity: Goal: Risk for impaired skin integrity will  decrease Outcome: Progressing   Problem: Metabolic: Goal: Ability to maintain appropriate glucose levels will improve Outcome: Progressing

## 2019-10-23 LAB — GLUCOSE, CAPILLARY
Glucose-Capillary: 131 mg/dL — ABNORMAL HIGH (ref 70–99)
Glucose-Capillary: 201 mg/dL — ABNORMAL HIGH (ref 70–99)

## 2019-10-23 MED ORDER — METFORMIN HCL ER 500 MG PO TB24: 1000.0000 mg | ORAL_TABLET | Freq: Two times a day (BID) | ORAL | 3 refills | Status: DC

## 2019-10-23 NOTE — Discharge Summary (Signed)
Physician Discharge Summary  Steven Holmes ZOX:[id number] DOB: 08/30/1961 DOA: 10/15/2019  [facility]  Admit date: 10/15/2019 Discharge date: 10/23/2019  Admitted From: Home Disposition:  Home  Recommendations for Outpatient Follow-up:  1. Follow up with PCP in 1-2 weeks review CBGs log will recommend starting him on long-acting insulin as his hemoglobin A1c was 9 during the hospital stay, his Metformin, see below for further details. 2. Please obtain BMP/CBC in one week   Home Health:No Equipment/Devices:None  Discharge Condition:Stable CODE STATUS:Full Diet recommendation: Heart Healthy  Brief/Interim Summary: 58 y.o. male past medical history of diabetes mellitus type 2 with complications, essential hypertension obstructive sleep apnea/obesity hypoventilation syndrome, morbid obesity resented to the emergency room with several days history of shortness of breath in the ED was found hypoxic in the 80s was placed on nonrebreather and saturations improved to the 90s.  Covid 2 was positive on 10/15/2019 , started on empiric IV remdesivir and steroids and [facility]  Discharge Diagnoses:  Active Problems:   HTN (hypertension)   OSA (obstructive sleep apnea)   Acute respiratory failure with hypoxia (HCC)   Diabetes mellitus type 2, controlled, with complications (HCC)   Essential hypertension   Obesity hypoventilation syndrome (HCC)   Obesity, Class III, BMI 40-49.9 (morbid obesity) (HCC)   Complex regional pain syndrome I   Pneumonia due to COVID-19 virus   Hyperosmolar hyperglycemic state (HHS) (HCC)   Diabetes mellitus type 2, uncontrolled, with complications (HCC) Acute respiratory failure with hypoxia due to COVID-19 pneumonia: Admission he was placed on heated high flow, he was started on IV remdesivir steroids, vitamin C and zinc.  He did receive Actemra on 10/23/2019. He completed a 10-day course of steroids in house.  Obstructive  sleep apnea/obesity hypoventilation syndrome: Patient currently stable.  Essential hypertension: no changes made to his medication.  Diabetes mellitus type 2 uncontrolled with complications/hyperosmolar hyperglycemic nonketotic syndrome: On admission he was started on IV insulin drip his blood glucose improved this likely erratic in the setting of infection and steroids. He was transitioned to long-acting insulin plus sliding scale. He completed his steroid course in-house. His home medications were resumed as an outpatient.  His Metformin was increased to thousand, I would recommend to the PCP to start on on long-acting insulin as an outpatient as I think he will benefit from it.   Discharge Instructions  Discharge Instructions    Diet - low sodium heart healthy   Complete by: As directed    Increase activity slowly   Complete by: As directed      Allergies as of 10/23/2019      Reactions   Lisinopril Other (See Comments)   Multiple reactions including dizziness and depression      Medication List    STOP taking these medications   albuterol 108 (90 Base) MCG/ACT inhaler Commonly known as: VENTOLIN HFA   ALFALFA PO   atorvastatin 20 MG tablet Commonly known as: LIPITOR   benzonatate 100 MG capsule Commonly known as: Tessalon Perles   cyclobenzaprine 5 MG tablet Commonly known as: FLEXERIL   fluticasone 50 MCG/ACT nasal spray Commonly known as: Flonase   furosemide 20 MG tablet Commonly known as: LASIX   guaiFENesin-codeine 100-10 MG/5ML syrup Commonly known as: ROBITUSSIN AC   ketorolac 10 MG tablet Commonly known as: TORADOL   methocarbamol 750 MG tablet Commonly known as: Robaxin-750   nystatin cream Commonly known as: MYCOSTATIN   oxyCODONE-acetaminophen 5-325 MG tablet Commonly known as: PERCOCET/ROXICET  traMADol 50 MG tablet Commonly known as: ULTRAM     TAKE these medications   ELDERBERRY PO Take 1 tablet by mouth daily.   glipiZIDE 10  MG 24 hr tablet Commonly known as: GLUCOTROL XL Take 20 mg by mouth 2 (two) times daily. What changed: Another medication with the same name was removed. Continue taking this medication, and follow the directions you see here.   losartan 100 MG tablet Commonly known as: COZAAR Take 100 mg by mouth daily. What changed: Another medication with the same name was removed. Continue taking this medication, and follow the directions you see here.   metFORMIN 500 MG 24 hr tablet Commonly known as: GLUCOPHAGE-XR Take 1,000 mg by mouth 2 (two) times daily. What changed: Another medication with the same name was removed. Continue taking this medication, and follow the directions you see here.   multivitamin tablet Take 1 tablet by mouth daily.   vitamin C 500 MG tablet Commonly known as: ASCORBIC ACID Take 500 mg by mouth 2 (two) times daily.   ZICAM COLD REMEDY PO Take 1 tablet by mouth 4 (four) times daily as needed (cold symptoms).       Allergies  Allergen Reactions  . Lisinopril Other (See Comments)    Multiple reactions including dizziness and depression    Consultations:  none   Procedures/Studies: DG Chest 2 View  Result Date: 10/14/2019 CLINICAL DATA:  Shortness of breath, cough, COVID-19 positive EXAM: CHEST - 2 VIEW COMPARISON:  10/08/2019 FINDINGS: Heart size is mildly enlarged, unchanged. Interval development of diffuse patchy airspace opacities throughout both lungs, slightly more confluent within the lung bases. No pleural effusion or pneumothorax IMPRESSION: Interval development of diffuse patchy airspace opacities throughout both lungs, slightly more confluent within the lung bases, suspicious for multifocal atypical/viral pneumonia. Electronically Signed   By: Duanne Guess M.D.   On: 10/14/2019 17:43   Portable chest 1 View  Result Date: 10/15/2019 CLINICAL DATA:  Shortness of breath. EXAM: PORTABLE CHEST 1 VIEW COMPARISON:  October 14, 2019. FINDINGS:  Stable cardiomegaly. No pneumothorax is noted. Hypoinflation of the lungs is noted with bibasilar and perihilar opacities concerning for pneumonia. Bony thorax is unremarkable. IMPRESSION: Hypoinflation of the lungs with bibasilar and perihilar opacities concerning for pneumonia. Electronically Signed   By: Lupita Raider M.D.   On: 10/15/2019 12:55   DG Chest Port 1 View  Result Date: 10/08/2019 CLINICAL DATA:  Pneumonia, COVID-19 positive EXAM: PORTABLE CHEST 1 VIEW COMPARISON:  Portable exam 1325 hours compared to 03/29/2017 FINDINGS: Upper normal heart size. Mediastinal contours and pulmonary vascularity normal. Minimal bibasilar atelectasis and chronic accentuation of interstitial markings. Lungs otherwise clear. No segmental infiltrate, pleural effusion or pneumothorax. Bones unremarkable. IMPRESSION: Minimal bibasilar atelectasis. Electronically Signed   By: Ulyses Southward M.D.   On: 10/08/2019 13:53     Subjective: No complaints feels great.  Discharge Exam: Vitals:   10/23/19 0340 10/23/19 0809  BP: 107/65 (!) 148/87  Pulse: 72 61  Resp: 16 20  Temp: 97.7 F (36.5 C) 98.5 F (36.9 C)  SpO2: 94% 96%   Vitals:   10/22/19 1703 10/22/19 1956 10/23/19 0340 10/23/19 0809  BP: 126/76 117/77 107/65 (!) 148/87  Pulse: 79 83 72 61  Resp: Temp: 98.4 F (36.9 C) 97.7 F (36.5 C) 97.7 F (36.5 C) 98.5 F (36.9 C)  TempSrc: Oral Oral Oral Oral  SpO2: 91% 91% 94% 96%  Weight:      Height:  General: Pt is alert, awake, not in acute distress Cardiovascular: RRR, S1/S2 +, no rubs, no gallops Respiratory: CTA bilaterally, no wheezing, no rhonchi Abdominal: Soft, NT, ND, bowel sounds + Extremities: no edema, no cyanosis    The results of significant diagnostics from this hospitalization (including imaging, microbiology, ancillary and laboratory) are listed below for reference.     Microbiology: Recent Results (from the past 240 hour(s))  Culture, blood  (routine x 2)     Status: None   Collection Time: 10/15/19 12:40 AM   Specimen: BLOOD RIGHT HAND  Result Value Ref Range Status   Specimen Description BLOOD RIGHT HAND  Final   Special Requests   Final    BOTTLES DRAWN AEROBIC AND ANAEROBIC Blood Culture adequate volume   Culture   Final    NO GROWTH 5 DAYS Performed at [facility], [address] [place], [place] [address]    Report Status 10/20/2019 FINAL  Final  Culture, blood (routine x 2)     Status: None   Collection Time: 10/15/19 12:40 AM   Specimen: BLOOD RIGHT HAND  Result Value Ref Range Status   Specimen Description BLOOD RIGHT HAND  Final   Special Requests   Final    BOTTLES DRAWN AEROBIC AND ANAEROBIC Blood Culture adequate volume   Culture   Final    NO GROWTH 5 DAYS Performed at [facility], [address] [place], [place] [address]    Report Status 10/20/2019 FINAL  Final  Respiratory Panel by PCR     Status: None   Collection Time: 10/15/19 12:40 AM   Specimen: Nasopharyngeal Swab; Respiratory  Result Value Ref Range Status   Adenovirus NOT DETECTED NOT DETECTED Final   Coronavirus 229E NOT DETECTED NOT DETECTED Final    Comment: (NOTE) The Coronavirus on the Respiratory Panel, DOES NOT test for the novel  Coronavirus (2019 nCoV)    Coronavirus HKU1 NOT DETECTED NOT DETECTED Final   Coronavirus NL63 NOT DETECTED NOT DETECTED Final   Coronavirus OC43 NOT DETECTED NOT DETECTED Final   Metapneumovirus NOT DETECTED NOT DETECTED Final   Rhinovirus / Enterovirus NOT DETECTED NOT DETECTED Final   Influenza A NOT DETECTED NOT DETECTED Final   Influenza B NOT DETECTED NOT DETECTED Final   Parainfluenza Virus 1 NOT DETECTED NOT DETECTED Final   Parainfluenza Virus 2 NOT DETECTED NOT DETECTED Final   Parainfluenza Virus 3 NOT DETECTED NOT DETECTED Final   Parainfluenza Virus 4 NOT DETECTED NOT DETECTED Final   Respiratory Syncytial Virus NOT DETECTED NOT DETECTED Final   Bordetella  pertussis NOT DETECTED NOT DETECTED Final   Chlamydophila pneumoniae NOT DETECTED NOT DETECTED Final   Mycoplasma pneumoniae NOT DETECTED NOT DETECTED Final    Comment: Performed at [facility], [address] [place], [place] [address]  Respiratory Panel by PCR     Status: None   Collection Time: 10/15/19  3:15 PM  Result Value Ref Range Status   Adenovirus NOT DETECTED NOT DETECTED Final   Coronavirus 229E NOT DETECTED NOT DETECTED Final    Comment: (NOTE) The Coronavirus on the Respiratory Panel, DOES NOT test for the novel  Coronavirus (2019 nCoV)    Coronavirus HKU1 NOT DETECTED NOT DETECTED Final   Coronavirus NL63 NOT DETECTED NOT DETECTED Final   Coronavirus OC43 NOT DETECTED NOT DETECTED Final   Metapneumovirus NOT DETECTED NOT DETECTED Final   Rhinovirus / Enterovirus NOT DETECTED NOT DETECTED Final   Influenza A NOT DETECTED NOT  DETECTED Final   Influenza B NOT DETECTED NOT DETECTED Final   Parainfluenza Virus 1 NOT DETECTED NOT DETECTED Final   Parainfluenza Virus 2 NOT DETECTED NOT DETECTED Final   Parainfluenza Virus 3 NOT DETECTED NOT DETECTED Final   Parainfluenza Virus 4 NOT DETECTED NOT DETECTED Final   Respiratory Syncytial Virus NOT DETECTED NOT DETECTED Final   Bordetella pertussis NOT DETECTED NOT DETECTED Final   Chlamydophila pneumoniae NOT DETECTED NOT DETECTED Final   Mycoplasma pneumoniae NOT DETECTED NOT DETECTED Final    Comment: Performed at [facility], [address], [place], [place] [address]  MRSA PCR Screening     Status: None   Collection Time: 10/15/19  3:35 PM  Result Value Ref Range Status   MRSA by PCR NEGATIVE NEGATIVE Final    Comment:        The GeneXpert MRSA Assay (FDA approved for NASAL specimens only), is one component of a comprehensive MRSA colonization surveillance program. It is not intended to diagnose MRSA infection nor to guide or monitor treatment for MRSA infections. Performed at [facility], [address] [place], [place] [address]   SARS CORONAVIRUS 2 (TAT 6-24 HRS) Nasopharyngeal Nasopharyngeal Swab     Status: Abnormal   Collection Time: 10/15/19  4:30 PM   Specimen: Nasopharyngeal Swab  Result Value Ref Range Status   SARS Coronavirus 2 POSITIVE (A) NEGATIVE Final    Comment: RESULT CALLED TO, READ BACK BY AND VERIFIED WITH: MORGAN SCHDIMT RN.@1116  ON 12.13.2020 BY TCALDWELL MT. (NOTE) SARS-CoV-2 target nucleic acids are DETECTED. The SARS-CoV-2 RNA is generally detectable in upper and lower respiratory specimens during the acute phase of infection. Positive results are indicative of the presence of SARS-CoV-2 RNA. Clinical correlation with patient history and other diagnostic information is  necessary to determine patient infection status. Positive results do not rule out bacterial infection or co-infection with other viruses.  The expected result is Negative. Fact Sheet for Patients: HairSlick.no Fact Sheet for Healthcare Providers: quierodirigir.com This test is not yet approved or cleared by the [place] FDA and  has been authorized for detection and/or diagnosis of SARS-CoV-2 by FDA under an Emergency Use Authorization (EUA). This EUA will remain  in effect (meaning this test c an be used) for the duration of the COVID-19 declaration under Section 564(b)(1) of the Act, 21 U.S.C. section 360bbb-3(b)(1), unless the authorization is terminated or revoked sooner. Performed at [facility], [address], [place], [place] [address]      Labs: BNP (last 3 results) Recent Labs    10/15/19 1100  BNP 33.1   Basic Metabolic Panel: Recent Labs  Lab 10/17/19 0643 10/18/19 0643 10/19/19 0100 10/19/19 2121 10/20/19 0225 10/21/19 1708 10/22/19 0545  NA 143 141 139  --  136  --   --   K 4.0 4.4 4.4  --  4.2  --   --   CL 104 102 99  --  94*  --   --   CO2 --  29  --   --    GLUCOSE 215* 177* 246* 360* 211* 397*  --   BUN 28* 24* 25*  --  27*  --   --   CREATININE 0.84 0.76 0.70  --  0.67  --  0.86  CALCIUM 8.9 8.6* 8.8*  --  8.9  --   --   MG 2.2 2.0 2.0  --  2.1  --   --  PHOS 4.2 4.8* 4.7*  --  4.3  --   --    Liver Function Tests: Recent Labs  Lab 10/17/19 0643 10/18/19 0643 10/19/19 0100 10/20/19 0225  AST 20 19 22 22   ALT 29 27 30  37  ALKPHOS 64 60 61 63  BILITOT 0.6 0.5 0.8 0.6  PROT 6.3* 6.0* 6.2* 6.2*  ALBUMIN 2.7* 2.7* 3.0* 2.9*   No results for input(s): LIPASE, AMYLASE in the last 168 hours. No results for input(s): AMMONIA in the last 168 hours. CBC: Recent Labs  Lab 10/17/19 0643 10/18/19 0643 10/19/19 0100 10/20/19 0225  WBC 6.2 5.5 5.4 5.8  NEUTROABS 4.8 4.1 4.0 4.6  HGB 14.0 14.0 14.7 14.9  HCT 41.7 41.2 44.2 44.1  MCV 94.1 93.2 94.0 92.8  PLT 196 192 200 201   Cardiac Enzymes: No results for input(s): CKTOTAL, CKMB, CKMBINDEX, TROPONINI in the last 168 hours. BNP: Invalid input(s): POCBNP CBG: Recent Labs  Lab 10/21/19 1127 10/21/19 1607 10/22/19 0959 10/22/19 1248 10/22/19 2105  GLUCAP 329* 453* 356* 329* 343*   D-Dimer No results for input(s): DDIMER in the last 72 hours. Hgb A1c No results for input(s): HGBA1C in the last 72 hours. Lipid Profile No results for input(s): CHOL, HDL, LDLCALC, TRIG, CHOLHDL, LDLDIRECT in the last 72 hours. Thyroid function studies No results for input(s): TSH, T4TOTAL, T3FREE, THYROIDAB in the last 72 hours.  Invalid input(s): FREET3 Anemia work up No results for input(s): VITAMINB12, FOLATE, FERRITIN, TIBC, IRON, RETICCTPCT in the last 72 hours. Urinalysis    Component Value Date/Time   COLORURINE YELLOW 09/06/2013 2047   APPEARANCEUR CLEAR 09/06/2013 2047   LABSPEC 1.024 09/06/2013 2047   PHURINE 5.0 09/06/2013 2047   GLUCOSEU NEGATIVE 09/06/2013 2047   HGBUR NEGATIVE 09/06/2013 2047   BILIRUBINUR NEGATIVE 09/06/2013 2047   KETONESUR NEGATIVE 09/06/2013 2047    PROTEINUR NEGATIVE 09/06/2013 2047   UROBILINOGEN 0.2 09/06/2013 2047   NITRITE NEGATIVE 09/06/2013 2047   LEUKOCYTESUR NEGATIVE 09/06/2013 2047   Sepsis Labs Invalid input(s): PROCALCITONIN,  WBC,  LACTICIDVEN Microbiology Recent Results (from the past 240 hour(s))  Culture, blood (routine x 2)     Status: None   Collection Time: 10/15/19 12:40 AM   Specimen: BLOOD RIGHT HAND  Result Value Ref Range Status   Specimen Description BLOOD RIGHT HAND  Final   Special Requests   Final    BOTTLES DRAWN AEROBIC AND ANAEROBIC Blood Culture adequate volume   Culture   Final    NO GROWTH 5 DAYS Performed at [facility], [address], [place], [address] [place]    Report Status 10/20/2019 FINAL  Final  Culture, blood (routine x 2)     Status: None   Collection Time: 10/15/19 12:40 AM   Specimen: BLOOD RIGHT HAND  Result Value Ref Range Status   Specimen Description BLOOD RIGHT HAND  Final   Special Requests   Final    BOTTLES DRAWN AEROBIC AND ANAEROBIC Blood Culture adequate volume   Culture   Final    NO GROWTH 5 DAYS Performed at [facility], [address] [place], [address] [place]    Report Status 10/20/2019 FINAL  Final  Respiratory Panel by PCR     Status: None   Collection Time: 10/15/19 12:40 AM   Specimen: Nasopharyngeal Swab; Respiratory  Result Value Ref Range Status   Adenovirus NOT DETECTED NOT DETECTED Final   Coronavirus 229E NOT DETECTED NOT DETECTED Final    Comment: (NOTE) The  Coronavirus on the Respiratory Panel, DOES NOT test for the novel  Coronavirus (2019 nCoV)    Coronavirus HKU1 NOT DETECTED NOT DETECTED Final   Coronavirus NL63 NOT DETECTED NOT DETECTED Final   Coronavirus OC43 NOT DETECTED NOT DETECTED Final   Metapneumovirus NOT DETECTED NOT DETECTED Final   Rhinovirus / Enterovirus NOT DETECTED NOT DETECTED Final   Influenza A NOT DETECTED NOT DETECTED Final   Influenza B NOT DETECTED NOT DETECTED Final   Parainfluenza  Virus 1 NOT DETECTED NOT DETECTED Final   Parainfluenza Virus 2 NOT DETECTED NOT DETECTED Final   Parainfluenza Virus 3 NOT DETECTED NOT DETECTED Final   Parainfluenza Virus 4 NOT DETECTED NOT DETECTED Final   Respiratory Syncytial Virus NOT DETECTED NOT DETECTED Final   Bordetella pertussis NOT DETECTED NOT DETECTED Final   Chlamydophila pneumoniae NOT DETECTED NOT DETECTED Final   Mycoplasma pneumoniae NOT DETECTED NOT DETECTED Final    Comment: Performed at [facility], [place] [address], [place], [place] [address]  Respiratory Panel by PCR     Status: None   Collection Time: 10/15/19  3:15 PM  Result Value Ref Range Status   Adenovirus NOT DETECTED NOT DETECTED Final   Coronavirus 229E NOT DETECTED NOT DETECTED Final    Comment: (NOTE) The Coronavirus on the Respiratory Panel, DOES NOT test for the novel  Coronavirus (2019 nCoV)    Coronavirus HKU1 NOT DETECTED NOT DETECTED Final   Coronavirus NL63 NOT DETECTED NOT DETECTED Final   Coronavirus OC43 NOT DETECTED NOT DETECTED Final   Metapneumovirus NOT DETECTED NOT DETECTED Final   Rhinovirus / Enterovirus NOT DETECTED NOT DETECTED Final   Influenza A NOT DETECTED NOT DETECTED Final   Influenza B NOT DETECTED NOT DETECTED Final   Parainfluenza Virus 1 NOT DETECTED NOT DETECTED Final   Parainfluenza Virus 2 NOT DETECTED NOT DETECTED Final   Parainfluenza Virus 3 NOT DETECTED NOT DETECTED Final   Parainfluenza Virus 4 NOT DETECTED NOT DETECTED Final   Respiratory Syncytial Virus NOT DETECTED NOT DETECTED Final   Bordetella pertussis NOT DETECTED NOT DETECTED Final   Chlamydophila pneumoniae NOT DETECTED NOT DETECTED Final   Mycoplasma pneumoniae NOT DETECTED NOT DETECTED Final    Comment: Performed at [facility], [place], [place], [place] [address]  MRSA PCR Screening     Status: None   Collection Time: 10/15/19  3:35 PM  Result Value Ref Range Status   MRSA by PCR NEGATIVE NEGATIVE Final    Comment:         The GeneXpert MRSA Assay (FDA approved for NASAL specimens only), is one component of a comprehensive MRSA colonization surveillance program. It is not intended to diagnose MRSA infection nor to guide or monitor treatment for MRSA infections. Performed at [facility], [place] [address] [place], [place] [address]   SARS CORONAVIRUS 2 (TAT 6-24 HRS) Nasopharyngeal Nasopharyngeal Swab     Status: Abnormal   Collection Time: 10/15/19  4:30 PM   Specimen: Nasopharyngeal Swab  Result Value Ref Range Status   SARS Coronavirus 2 POSITIVE (A) NEGATIVE Final    Comment: RESULT CALLED TO, READ BACK BY AND VERIFIED WITH: MORGAN SCHDIMT RN.@1116  ON 12.13.2020 BY TCALDWELL MT. (NOTE) SARS-CoV-2 target nucleic acids are DETECTED. The SARS-CoV-2 RNA is generally detectable in upper and lower respiratory specimens during the acute phase of infection. Positive results are indicative of the presence of SARS-CoV-2 RNA. Clinical correlation with patient history and other diagnostic information is  necessary to determine patient infection status. Positive results do not rule out bacterial infection or co-infection with other viruses.  The expected result is Negative. Fact Sheet for Patients: HairSlick.no Fact Sheet for Healthcare Providers: quierodirigir.com This test is not yet approved or cleared by the [place] FDA and  has been authorized for detection and/or diagnosis of SARS-CoV-2 by FDA under an Emergency Use Authorization (EUA). This EUA will remain  in effect (meaning this test c an be used) for the duration of the COVID-19 declaration under Section 564(b)(1) of the Act, 21 U.S.C. section 360bbb-3(b)(1), unless the authorization is terminated or revoked sooner. Performed at [facility], [address] [place], [place] [address]      Time coordinating discharge: Over 30 minutes  SIGNED:   Marinda Elk, MD  [facility] 10/23/2019, 8:23 AM Pager   If 7PM-7AM, please contact night-coverage [email or web address] Password [email or web address]

## 2019-10-23 NOTE — Plan of Care (Addendum)
Patient discharged home via wheelchair escorted to personal vehicle with spoue and daughter by RN. VSS and documented. IVs removed. Blood glucose of 201 covered with insulin; RN encouraged pt to eat within the hour. Belongings (wallet and cutting tool) returned from security.

## 2019-10-23 NOTE — Plan of Care (Addendum)
Plan to discharge patient today per MD order. VSS; pt has no complaints. Will perform discharge education and continue to monitor. RN updated wife Rip Harbour @ [phone]. All questions answered. Wife and daughter to pick pt up at approx 1130.  Problem: Education: Goal: Knowledge of risk factors and measures for prevention of condition will improve Outcome: Adequate for Discharge   Problem: Coping: Goal: Psychosocial and spiritual needs will be supported Outcome: Adequate for Discharge   Problem: Respiratory: Goal: Will maintain a patent airway Outcome: Adequate for Discharge Goal: Complications related to the disease process, condition or treatment will be avoided or minimized Outcome: Adequate for Discharge   Problem: Education: Goal: Knowledge of General Education information will improve Description: Including pain rating scale, medication(s)/side effects and non-pharmacologic comfort measures Outcome: Adequate for Discharge   Problem: Health Behavior/Discharge Planning: Goal: Ability to manage health-related needs will improve Outcome: Adequate for Discharge   Problem: Clinical Measurements: Goal: Ability to maintain clinical measurements within normal limits will improve Outcome: Adequate for Discharge Goal: Will remain free from infection Outcome: Adequate for Discharge Goal: Diagnostic test results will improve Outcome: Adequate for Discharge Goal: Respiratory complications will improve Outcome: Adequate for Discharge Goal: Cardiovascular complication will be avoided Outcome: Adequate for Discharge   Problem: Activity: Goal: Risk for activity intolerance will decrease Outcome: Adequate for Discharge   Problem: Nutrition: Goal: Adequate nutrition will be maintained Outcome: Adequate for Discharge   Problem: Coping: Goal: Level of anxiety will decrease Outcome: Adequate for Discharge   Problem: Elimination: Goal: Will not experience complications related to bowel  motility Outcome: Adequate for Discharge Goal: Will not experience complications related to urinary retention Outcome: Adequate for Discharge   Problem: Pain Managment: Goal: General experience of comfort will improve Outcome: Adequate for Discharge   Problem: Safety: Goal: Ability to remain free from injury will improve Outcome: Adequate for Discharge   Problem: Skin Integrity: Goal: Risk for impaired skin integrity will decrease Outcome: Adequate for Discharge   Problem: Metabolic: Goal: Ability to maintain appropriate glucose levels will improve Outcome: Adequate for Discharge

## 2019-10-24 LAB — GLUCOSE, CAPILLARY
Glucose-Capillary: 414 mg/dL — ABNORMAL HIGH (ref 70–99)
Glucose-Capillary: 282 mg/dL — ABNORMAL HIGH (ref 70–99)

## 2021-10-22 DIAGNOSIS — I10 Essential (primary) hypertension: Secondary | ICD-10-CM | POA: Diagnosis not present

## 2021-10-22 DIAGNOSIS — H40013 Open angle with borderline findings, low risk, bilateral: Secondary | ICD-10-CM | POA: Diagnosis not present

## 2021-10-22 DIAGNOSIS — H3562 Retinal hemorrhage, left eye: Secondary | ICD-10-CM | POA: Diagnosis not present

## 2021-10-22 DIAGNOSIS — E113293 Type 2 diabetes mellitus with mild nonproliferative diabetic retinopathy without macular edema, bilateral: Secondary | ICD-10-CM | POA: Diagnosis not present

## 2021-12-03 ENCOUNTER — Emergency Department (HOSPITAL_COMMUNITY)
Admission: EM | Admit: 2021-12-03 | Discharge: 2021-12-04 | Disposition: A | Discharge status: Home / Self Care | Payer: No Typology Code available for payment source | Attending: Emergency Medicine | Admitting: Emergency Medicine

## 2021-12-03 ENCOUNTER — Other Ambulatory Visit: Payer: Self-pay

## 2021-12-03 ENCOUNTER — Encounter (HOSPITAL_COMMUNITY): Payer: Self-pay

## 2021-12-03 DIAGNOSIS — L03115 Cellulitis of right lower limb: Secondary | ICD-10-CM | POA: Diagnosis not present

## 2021-12-03 DIAGNOSIS — D696 Thrombocytopenia, unspecified: Secondary | ICD-10-CM | POA: Insufficient documentation

## 2021-12-03 DIAGNOSIS — Z7984 Long term (current) use of oral hypoglycemic drugs: Secondary | ICD-10-CM | POA: Insufficient documentation

## 2021-12-03 DIAGNOSIS — E1165 Type 2 diabetes mellitus with hyperglycemia: Secondary | ICD-10-CM | POA: Insufficient documentation

## 2021-12-03 DIAGNOSIS — R21 Rash and other nonspecific skin eruption: Secondary | ICD-10-CM | POA: Diagnosis present

## 2021-12-03 NOTE — ED Triage Notes (Signed)
Patient arrives from home with report of right leg pain, swelling, and rash. Pt endorses rash, headache, and dizziness than began x 2 days ago. Hx of similar episodes, diagnosed with cellulitis and complex pain syndrome.

## 2021-12-03 NOTE — ED Provider Triage Note (Signed)
Emergency Medicine Provider Triage Evaluation Note  Steven Holmes , a 61 y.o. male  was evaluated in triage.  Pt complains of right leg swelling that started this morning.  It is started at the ankle and now progressed up to the knee.  No fevers but reports associated chills.  States he was feeling unwell yesterday.  He does have similar symptoms in the past which they thought was attributed to possible MRSA versus complex pain regional syndrome.  Review of Systems  Positive:  Negative: See above   Physical Exam  BP 133/90 (BP Location: Left Arm)    Pulse 86    Temp 97.9 F (36.6 C) (Oral)    Resp 16    Ht 5\' 9"  (1.753 m)    Wt 102.1 kg    SpO2 96%    BMI 33.23 kg/m  Gen:   Awake, no distress   Resp:  Normal effort  MSK:   Moves extremities without difficulty  Other:  Streaking erythema and warmth to the right lower extremity  Medical Decision Making  Medically screening exam initiated at 11:28 PM.  Appropriate orders placed.  Steven Holmes was informed that the remainder of the evaluation will be completed by another provider, this initial triage assessment does not replace that evaluation, and the importance of remaining in the ED until their evaluation is complete.     Steven Holmes, [place] 12/03/21 2334

## 2021-12-04 LAB — BASIC METABOLIC PANEL
Anion gap: 9 (ref 5–15)
BUN: 25 mg/dL — ABNORMAL HIGH (ref 6–20)
CO2: 22 mmol/L (ref 22–32)
Calcium: 8.8 mg/dL — ABNORMAL LOW (ref 8.9–10.3)
Chloride: 105 mmol/L (ref 98–111)
Creatinine, Ser: 0.84 mg/dL (ref 0.61–1.24)
GFR, Estimated: >60 mL/min (ref >60)
Glucose, Bld: 143 mg/dL — ABNORMAL HIGH (ref 70–99)
Potassium: 3.7 mmol/L (ref 3.5–5.1)
Sodium: 136 mmol/L (ref 135–145)

## 2021-12-04 LAB — CBC WITH DIFFERENTIAL/PLATELET
Abs Immature Granulocytes: 0.04 10*3/uL (ref 0.00–0.07)
Basophils Absolute: 0 10*3/uL (ref 0.0–0.1)
Basophils Relative: 0 %
Eosinophils Absolute: 0 10*3/uL (ref 0.0–0.5)
Eosinophils Relative: 0 %
HCT: 47.5 % (ref 39.0–52.0)
Hemoglobin: 16 g/dL (ref 13.0–17.0)
Immature Granulocytes: 0 %
Lymphocytes Relative: 15 %
Lymphs Abs: 1.3 10*3/uL (ref 0.7–4.0)
MCH: 32.6 pg (ref 26.0–34.0)
MCHC: 33.7 g/dL (ref 30.0–36.0)
MCV: 96.7 fL (ref 80.0–100.0)
Monocytes Absolute: 0.5 10*3/uL (ref 0.1–1.0)
Monocytes Relative: 6 %
Neutro Abs: 7.1 10*3/uL (ref 1.7–7.7)
Neutrophils Relative %: 79 %
Platelets: 107 10*3/uL — ABNORMAL LOW (ref 150–400)
RBC: 4.91 MIL/uL (ref 4.22–5.81)
RDW: 13.9 % (ref 11.5–15.5)
WBC: 9 10*3/uL (ref 4.0–10.5)
nRBC: 0 % (ref 0.0–0.2)

## 2021-12-04 MED ORDER — SODIUM CHLORIDE 0.9 % IV SOLN
1.0000 g | INTRAVENOUS | Status: DC
  Administered 2021-12-04: 1 g via INTRAVENOUS
  Filled 2021-12-04: qty 10

## 2021-12-04 MED ORDER — TRAMADOL HCL 50 MG PO TABS: 50.0000 mg | ORAL_TABLET | Freq: Four times a day (QID) | ORAL | 0 refills | Status: DC | PRN

## 2021-12-04 MED ORDER — DOXYCYCLINE HYCLATE 100 MG PO CAPS: 100.0000 mg | ORAL_CAPSULE | Freq: Two times a day (BID) | ORAL | 0 refills | Status: DC

## 2021-12-04 MED ORDER — ONDANSETRON HCL 4 MG/2ML IJ SOLN
4.0000 mg | Freq: Once | INTRAMUSCULAR | Status: AC
  Administered 2021-12-04: 4 mg via INTRAVENOUS
  Filled 2021-12-04: qty 2

## 2021-12-04 MED ORDER — FENTANYL CITRATE PF 50 MCG/ML IJ SOSY
50.0000 ug | PREFILLED_SYRINGE | Freq: Once | INTRAMUSCULAR | Status: AC
  Administered 2021-12-04: 50 ug via INTRAVENOUS
  Filled 2021-12-04: qty 1

## 2021-12-04 MED ORDER — CEFTRIAXONE SODIUM 1 G IJ SOLR: 1.0000 g | Freq: Once | INTRAMUSCULAR | Status: DC

## 2021-12-04 NOTE — Discharge Instructions (Addendum)

## 2021-12-04 NOTE — ED Provider Notes (Signed)
[facility]-EMERGENCY DEPT Provider Note   CSN: [id number] Arrival date & time: 12/03/21  2248     History  Chief Complaint  Patient presents with   Rash   Leg Swelling    Steven Holmes is a 61 y.o. 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  Patient states that yesterday he noticed that he had increased swelling in his right lower extremity followed by heat, pain, redness, burning of the skin which became significantly worse today.  Patient has had multiple episodes of recurrent infection of the right lower extremity.  He has chronic peripheral edema in the right lower extremity which has been ongoing for approximately 10 years.  He follows at the [place].  He denies fevers, chills.   Rash     Home Medications Prior to Admission medications   Medication Sig Start Date End Date Taking? Authorizing Provider  doxycycline (VIBRAMYCIN) 100 MG capsule Take 1 capsule (100 mg total) by mouth 2 (two) times daily. One po bid x 7 days 12/04/21  Yes Cherylin Waguespack, PA-C  traMADol (ULTRAM) 50 MG tablet Take 1 tablet (50 mg total) by mouth every 6 (six) hours as needed. 12/04/21  Yes Cara Thaxton, PA-C  ELDERBERRY PO Take 1 tablet by mouth daily.    [provider]  glipiZIDE (GLUCOTROL XL) 10 MG 24 hr tablet Take 20 mg by mouth 2 (two) times daily.    [provider]  Homeopathic Products [facility] COLD REMEDY PO) Take 1 tablet by mouth 4 (four) times daily as needed (cold symptoms).    [provider]  losartan (COZAAR) 100 MG tablet Take 100 mg by mouth daily.    [provider]  metFORMIN (GLUCOPHAGE-XR) 500 MG 24 hr tablet Take 2 tablets (1,000 mg total) by mouth 2 (two) times daily. 10/23/19   Marinda Elk, MD  Multiple Vitamin (MULTIVITAMIN) tablet Take 1 tablet by mouth daily.    [provider]  vitamin C (ASCORBIC ACID) 500 MG tablet Take 500 mg by mouth 2 (two) times daily.    [provider]      Allergies    Lisinopril    Review of Systems   Review of Systems  Skin:  Positive for rash.   Physical Exam Updated Vital Signs BP 126/87    Pulse 85    Temp 97.9 F (36.6 C) (Oral)    Resp 16    Ht 5\' 9"  (1.753 m)    Wt 102.1 kg    SpO2 92%    BMI 33.23 kg/m  Physical Exam Vitals and nursing note reviewed.  Constitutional:      General: He is not in acute distress.    Appearance: He is well-developed. He is not diaphoretic.  HENT:     Head: Normocephalic and atraumatic.  Eyes:     General: No scleral icterus.    Conjunctiva/sclera: Conjunctivae normal.  Cardiovascular:     Rate and Rhythm: Normal rate and regular rhythm.     Heart sounds: Normal heart sounds.  Pulmonary:     Effort: Pulmonary effort is normal. No respiratory distress.     Breath sounds: Normal breath sounds.  Abdominal:     Palpations: Abdomen is soft.     Tenderness: There is no abdominal tenderness.  Musculoskeletal:     Cervical back: Normal range of motion and neck supple.  Skin:    General: Skin is warm.     Comments: Right lower extremity with swelling, warmth, redness from the ankle up to the shin with poorly demarcated border, no lymphangitis, tender to palpation  Neurological:     Mental Status: He is alert.  Psychiatric:        Behavior: Behavior normal.    ED Results / Procedures / Treatments   Labs (all labs ordered are listed, but only abnormal results are displayed) Labs Reviewed  CBC WITH DIFFERENTIAL/PLATELET - Abnormal; Notable for the following components:      Result Value   Platelets 107 (*)    All other components within normal limits  BASIC METABOLIC PANEL - Abnormal; Notable for the following components:   Glucose, Bld 143 (*)    BUN 25 (*)    Calcium 8.8 (*)    All other components within normal limits    EKG None  Radiology No  results found.  Procedures Procedures    Medications Ordered in ED Medications  cefTRIAXone (ROCEPHIN) 1 g in sodium chloride 0.9 % 100 mL IVPB (1 g Intravenous New Bag/Given 12/04/21 0139)  fentaNYL (SUBLIMAZE) injection 50 mcg (50 mcg Intravenous Given 12/04/21 0138)  ondansetron (ZOFRAN) injection 4 mg (4 mg Intravenous Given 12/04/21 0137)    ED Course/ Medical Decision Making/ A&P Clinical Course as of 12/04/21 0301  Wed Dec 04, 2021  0232 Basic metabolic panel(!) [AH]  0232 Glucose(!): 143 BMP with mild hyperglycemia [AH]  0233 CBC with Differential(!) CBC with thrombocytopenia of insignificant value.  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  Differential diagnosis for swelling of the lower extremity includes trauma, DVT, stasis dermatitis, lymphedema, cellulitis, necrotizing fasciitis. 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  Patient is without leukocytosis or fever suggestive of localized infection to the skin of the right lower extremity.  Patient charts reviewed and no history of MRSA.  I have ordered IV Rocephin here and will discharge the patient on doxycycline with close outpatient follow-up and strict return precautions.  Amount and/or Complexity of Data Reviewed Independent Historian:     Details: Patient's family member at bedside Labs: ordered. Decision-making details documented in ED Course.  Risk Prescription drug management.    Final Clinical Impression(s) / ED Diagnoses Final diagnoses:  Cellulitis of right lower extremity    Rx / DC Orders ED Discharge Orders          Ordered    doxycycline (VIBRAMYCIN) 100 MG capsule  2 times daily        12/04/21 0229    traMADol (ULTRAM) 50 MG tablet  Every 6  hours PRN        12/04/21 0230              Arthor Captain, PA-C 12/04/21 0301    Sabas Sous, MD 12/04/21 [phone]

## 2024-01-05 ENCOUNTER — Ambulatory Visit: Payer: Self-pay | Admitting: General Practice

## 2024-01-05 ENCOUNTER — Ambulatory Visit: Admission: EM | Admit: 2024-01-05 | Discharge: 2024-01-05 | Disposition: A | Discharge status: Home / Self Care

## 2024-01-05 NOTE — Telephone Encounter (Signed)
  Chief Complaint: URI, cough Symptoms: cough, CP with coughing, sob Frequency: about 3 days Pertinent Negatives: Patient denies fever, lung hx,   Disposition: [] ED /[x] Urgent Care (no appt availability in office) / [] Appointment(In office/virtual)/ []  [facility]/ [] Home Care/ [] Refused Recommended Disposition /[] [facility]/ []  Follow-up with PCP  Additional Notes: Pt c/o productive cough x3 days. Pt states that he is UTD on his vax. Pt states that he is SOB and has CP when coughing. Pt denies fever. Denies coughing up blood. States that he has been on a recent long road trip. Pt states that he has hx of pneumonia. Denies travel to foreign country in last 2 months. Pt is not currently established with a provider, attempted to move pt into a new pt visit sooner, no availability. Pt advised to utilize UC at this time. Pt agreeable.   Copied from CRM [id number]. Topic: Clinical - Red Word Triage >> Jan 05, 2024  2:50 PM Suzette B wrote: [facility] that prompted transfer to Nurse Triage: symptoms of pneumonia, sob, fluid like in lungs, coughing since Sunday Reason for Disposition  [1] MILD difficulty breathing (e.g., minimal/no SOB at rest, SOB with walking, pulse <100) AND [2] still present when not coughing  Answer Assessment - Initial Assessment Questions 1. ONSET: "When did the cough begin?"      3 days 2. SEVERITY: "How bad is the cough today?"      Moderate, has been severe, cough is improving, slightly 3. SPUTUM: "Describe the color of your sputum" (none, dry cough; clear, white, yellow, green)     yellow 4. HEMOPTYSIS: "Are you coughing up any blood?" If so ask: "How much?" (flecks, streaks, tablespoons, etc.)     denies 5. DIFFICULTY BREATHING: "Are you having difficulty breathing?" If Yes, ask: "How bad is it?" (e.g., mild, moderate, severe)    - MILD: No SOB at rest, mild SOB with walking, speaks normally in sentences, can lie down, no retractions, pulse < 100.     - MODERATE: SOB at rest, SOB with minimal exertion and prefers to sit, cannot lie down flat, speaks in phrases, mild retractions, audible wheezing, pulse 100-120.    - SEVERE: Very SOB at rest, speaks in single words, struggling to breathe, sitting hunched forward, retractions, pulse > 120      mild 6. FEVER: "Do you have a fever?" If Yes, ask: "What is your temperature, how was it measured, and when did it start?"     denies 7. CARDIAC HISTORY: "Do you have any history of heart disease?" (e.g., heart attack, congestive heart failure)      denies 8. LUNG HISTORY: "Do you have any history of lung disease?"  (e.g., pulmonary embolus, asthma, emphysema)     Hx of pneumonia,  9. PE RISK FACTORS: "Do you have a history of blood clots?" (or: recent major surgery, recent prolonged travel, bedridden)     Recent car trip 10. OTHER SYMPTOMS: "Do you have any other symptoms?" (e.g., runny nose, wheezing, chest pain)       Moderate CP, wheezing 12. TRAVEL: "Have you traveled out of the country in the last month?" (e.g., travel history, exposures)       denies  Protocols used: Cough - Acute Productive-A-AH

## 2024-03-11 ENCOUNTER — Encounter: Payer: Self-pay | Admitting: Nurse Practitioner

## 2024-03-11 ENCOUNTER — Ambulatory Visit (INDEPENDENT_AMBULATORY_CARE_PROVIDER_SITE_OTHER): Payer: No Typology Code available for payment source | Admitting: Nurse Practitioner

## 2024-03-11 VITALS — BP 142/90 | HR 70 | Temp 98.7°F | Ht 69.0 in | Wt 220.6 lb

## 2024-03-11 DIAGNOSIS — Z1329 Encounter for screening for other suspected endocrine disorder: Secondary | ICD-10-CM

## 2024-03-11 DIAGNOSIS — E785 Hyperlipidemia, unspecified: Secondary | ICD-10-CM

## 2024-03-11 DIAGNOSIS — I10 Essential (primary) hypertension: Secondary | ICD-10-CM | POA: Diagnosis not present

## 2024-03-11 DIAGNOSIS — Z125 Encounter for screening for malignant neoplasm of prostate: Secondary | ICD-10-CM

## 2024-03-11 DIAGNOSIS — Z85820 Personal history of malignant melanoma of skin: Secondary | ICD-10-CM | POA: Diagnosis not present

## 2024-03-11 DIAGNOSIS — E119 Type 2 diabetes mellitus without complications: Secondary | ICD-10-CM

## 2024-03-11 DIAGNOSIS — E1169 Type 2 diabetes mellitus with other specified complication: Secondary | ICD-10-CM | POA: Diagnosis not present

## 2024-03-11 MED ORDER — LOSARTAN POTASSIUM 50 MG PO TABS: 50.0000 mg | ORAL_TABLET | Freq: Every day | ORAL | 3 refills | Status: AC

## 2024-03-11 NOTE — Progress Notes (Signed)
 Steven Burkitt, NP-C Phone: [phone]  Steven Holmes is a 63 y.o. male who presents today to establish care.  Discussed the use of AI scribe software for clinical note transcription with the patient, who gave verbal consent to proceed.  History of Present Illness   Steven Holmes is a 63 year old male with diabetes, hypertension, and hyperlipidemia who presents for re-establishing care and medication management.  He is re-establishing care after previously being a patient at this practice until 2017 and then receiving care at the [place] starting in 2018. He is currently not on any medications, having stopped all previous treatments, including losartan  for blood pressure and atorvastatin  for cholesterol.  He has a history of diabetes diagnosed in 2013, initially discovered after a significant spike in blood sugar following the consumption of orange juice. His A1c was previously reduced to 6.5 but has since increased, possibly to around 8.5. He experiences excessive thirst and urination, and occasionally feels his blood sugar might be low, though not regularly. He has had a low blood sugar reading of 70 in the past. He does not currently monitor his blood sugar at home.  Regarding his hypertension, he previously took losartan  but is not currently on any medication. He does not monitor his blood pressure at home. No chest pain, shortness of breath, or general swelling in the legs, though he notes periodic swelling and pain in the left knee following a fall in April 2025.  He has a history of melanoma, with part of the tissue removed from his arm. He has had other skin lesions treated in the past, including some frozen off. He was regularly seeing a dermatologist at the [facility] for this condition.  In terms of lifestyle, he reports a diet that avoids sweets and sugar, opting for zero-sugar sodas and whole wheat bread. He lacks regular exercise. He has experienced urinary discomfort with  metformin  in the past, which he associates with urinary tract infection-like symptoms.  He recalls having blood work done in October 2024 and possibly more recently following an urgent care visit after stepping on a pitchfork in April 2025.      Active Ambulatory Problems    Diagnosis Date Noted   HTN (hypertension) 05/13/2012   OSA (obstructive sleep apnea) 05/13/2012   Diverticulosis    Obesity (BMI 30-39.9) 05/13/2012   Lower extremity venous stasis 05/13/2012   Dyspnea 08/17/2012   Allergic rhinitis 12/25/2012   DM type 2 (diabetes mellitus, type 2) (HCC) 05/22/2013   Lumbar spinal stenosis 02/03/2014   Degenerative disk disease 02/26/2014   Preoperative evaluation of a medical condition to rule out surgical contraindications (TAR required) 05/13/2014   Candidiasis, intertriginous 05/13/2014   Hyperlipidemia associated with type 2 diabetes mellitus (HCC) 02/15/2015   Multifocal pneumonia 10/14/2019   Acute respiratory failure with hypoxia (HCC) 10/14/2019   Obesity hypoventilation syndrome (HCC) 10/15/2019   Obesity, Class III, BMI 40-49.9 (morbid obesity) 10/15/2019   Complex regional pain syndrome I 10/15/2019   Hyperosmolar hyperglycemic state (HHS) (HCC) 10/15/2019   Hx of malignant melanoma of skin 03/11/2024   Resolved Ambulatory Problems    Diagnosis Date Noted   Hypertension    Cough 09/02/2012   Rhinitis 09/23/2012   Acute respiratory disease due to COVID-19 virus 10/14/2019   Hyperglycemia due to type 2 diabetes mellitus (HCC) 10/14/2019   Acute respiratory failure (HCC) 10/14/2019   Diabetes mellitus type 2, controlled, with complications (HCC) 10/15/2019   Essential hypertension 10/15/2019   Pneumonia due to COVID-19  virus 10/15/2019   Diabetes mellitus type 2, uncontrolled, with complications 10/16/2019   Past Medical History:  Diagnosis Date   Complex regional pain syndrome    COVID-19    Diabetes mellitus without complication (HCC)    DJD (degenerative  joint disease), lumbar     Family History  Problem Relation Age of Onset   Cancer Mother        skin Ca   Cancer Father        basal cell , nose     Social History   Socioeconomic History   Marital status: Married    Spouse name: Not on file   Number of children: Not on file   Years of education: Not on file   Highest education level: Not on file  Occupational History   Not on file  Tobacco Use   Smoking status: Former    Current packs/day: 0.00    Average packs/day: 1 pack/day for 25.0 years (25.0 ttl pk-yrs)    Types: Cigarettes    Start date: 11/04/1975    Quit date: 11/03/2000    Years since quitting: 23.3   Smokeless tobacco: Never  Vaping Use   Vaping status: Never Used  Substance and Sexual Activity   Alcohol use: No   Drug use: No   Sexual activity: Not on file  Other Topics Concern   Not on file  Social History Narrative   Not on file   Social Drivers of Health   Financial Resource Strain: Low Risk  (08/26/2023)   Received from [facility]   Overall Financial Resource Strain (CARDIA)    Difficulty of Paying Living Expenses: Not hard at all  Food Insecurity: No Food Insecurity (08/26/2023)   Received from [facility]   Hunger Vital Sign    Worried About Running Out of Food in the Last Year: Never true    Ran Out of Food in the Last Year: Never true  Transportation Needs: No Transportation Needs (08/26/2023)   Received from [facility]   PRAPARE - Transportation    Lack of Transportation (Medical): No    Lack of Transportation (Non-Medical): No  Physical Activity: Patient Declined (08/26/2023)   Received from [facility]   Exercise Vital Sign    Days of Exercise per Week: Patient declined    Minutes of Exercise per Session: Patient declined  Stress: No Stress Concern Present (08/26/2023)   Received from [facility] Occupational [facility] - Occupational Stress Questionnaire    Feeling of Stress : Only a little   Social Connections: Patient Declined (08/26/2023)   Received from [facility]   Social Connection and Isolation Panel [NHANES]    Frequency of Communication with Friends and Family: Patient declined    Frequency of Social Gatherings with Friends and Family: Patient declined    Attends Religious Services: Patient declined    Database administrator or Organizations: Patient declined    Attends Banker Meetings: Patient declined    Marital Status: Patient declined  Intimate Partner Violence: Not At Risk (08/26/2023)   Received from [facility]   Humiliation, Afraid, Rape, and Kick questionnaire    Fear of Current or Ex-Partner: No    Emotionally Abused: No    Physically Abused: No    Sexually Abused: No    ROS  General:  Negative for unexplained weight loss, fever Skin: Negative for new or changing mole, sore that won't heal  HEENT: Negative for trouble hearing, trouble seeing, ringing in ears, mouth sores, hoarseness, change in voice, dysphagia. CV:  Negative for chest pain, dyspnea, edema, palpitations Resp: Negative for cough, dyspnea, hemoptysis GI: Negative for nausea, vomiting, diarrhea, constipation, abdominal pain, melena, hematochezia. GU: Negative for dysuria, incontinence, urinary hesitance, hematuria, vaginal or penile discharge, polyuria, sexual difficulty, lumps in testicle or breasts MSK: Negative for muscle cramps or aches, joint pain or swelling Neuro: Negative for headaches, weakness, numbness, dizziness, passing out/fainting Psych: Negative for depression, anxiety, memory problems  Objective  Physical Exam Vitals:   03/11/24 1359 03/11/24 1424  BP: (!) 136/90 (!) 142/90  Pulse: 70   Temp: 98.7 F (37.1 C)   SpO2: 95%     BP Readings from Last 3 Encounters:  03/11/24 (!) 142/90  12/04/21 (!) 133/97  10/23/19 (!) 132/93   Wt Readings from Last 3 Encounters:  03/11/24 220 lb 9.6 oz (100.1 kg)  12/03/21 225 lb (102.1 kg)  10/15/19  239 lb 10.2 oz (108.7 kg)    Physical Exam Constitutional:      General: He is not in acute distress.    Appearance: Normal appearance.  HENT:     Head: Normocephalic.  Cardiovascular:     Rate and Rhythm: Normal rate and regular rhythm.     Heart sounds: Normal heart sounds.  Pulmonary:     Effort: Pulmonary effort is normal.     Breath sounds: Normal breath sounds.  Skin:    General: Skin is warm and dry.  Neurological:     General: No focal deficit present.     Mental Status: He is alert.  Psychiatric:        Mood and Affect: Mood normal.        Behavior: Behavior normal.      Assessment/Plan:   Type 2 diabetes mellitus without complication, without long-term current use of insulin  [facility]) Assessment & Plan: His type 2 diabetes previously showed an A1c reduction, likely increased since stopping medication. Last A1c per chart review was 8.7 in October 2024. He reports polydipsia and polyuria and has a previous intolerance to metformin . He is considering non-insulin  injections. Emphasize the importance of diet and exercise. Order an A1c test to assess current glycemic control. Discuss dietary modifications focusing on low carbohydrate and high protein intake. Encourage increased physical activity to aid in glycemic control. Plan to initiate diabetes medication based on A1c results, aiming for an A1c of less than 7%.   Orders: -     Hemoglobin A1c  Primary hypertension Assessment & Plan: Blood pressure is elevated at 136/90 mmHg. He was previously on losartan  100 mg but is currently not taking any antihypertensives and reports intermittent dizziness. Start losartan  50 mg daily. Recheck blood pressure before leaving the clinic. Schedule a nurse visit in two weeks for a blood pressure check and repeat lab work. Monitor renal function and potassium levels due to losartan  therapy. Encourage to monitor blood pressure at home.   Orders: -     CBC with Differential/Platelet -      Comprehensive metabolic panel with GFR -     Losartan  Potassium; Take 1 tablet (50 mg total) by mouth daily.  Dispense: 90 tablet; Refill: 3  Hyperlipidemia associated with type 2 diabetes mellitus (HCC) Assessment & Plan: He has hyperlipidemia with no current lipid-lowering therapy and was previously supposed to be on atorvastatin . Order a lipid panel to assess current cholesterol levels.  Orders: -     Lipid panel  Hx of malignant melanoma of skin Assessment & Plan: He has a history of malignant melanoma with previous excision from the left forearm and needs re-establishment of dermatology care. Refer to dermatology for evaluation and management of skin health.   Orders: -     Ambulatory referral to Dermatology  Thyroid disorder screen -     TSH  Screening PSA (prostate specific antigen) -     PSA    Return in about 2 weeks (around 03/25/2024) for Blood pressure check with nursing and labs, then 3 month follow up.   Steven Burkitt, NP-C [facility]

## 2024-03-11 NOTE — Assessment & Plan Note (Signed)
 He has a history of malignant melanoma with previous excision from the left forearm and needs re-establishment of dermatology care. Refer to dermatology for evaluation and management of skin health.

## 2024-03-11 NOTE — Assessment & Plan Note (Addendum)
 His type 2 diabetes previously showed an A1c reduction, likely increased since stopping medication. Last A1c per chart review was 8.7 in October 2024. He reports polydipsia and polyuria and has a previous intolerance to metformin . He is considering non-insulin  injections. Emphasize the importance of diet and exercise. Order an A1c test to assess current glycemic control. Discuss dietary modifications focusing on low carbohydrate and high protein intake. Encourage increased physical activity to aid in glycemic control. Plan to initiate diabetes medication based on A1c results, aiming for an A1c of less than 7%.

## 2024-03-11 NOTE — Assessment & Plan Note (Signed)
 Blood pressure is elevated at 136/90 mmHg. He was previously on losartan  100 mg but is currently not taking any antihypertensives and reports intermittent dizziness. Start losartan  50 mg daily. Recheck blood pressure before leaving the clinic. Schedule a nurse visit in two weeks for a blood pressure check and repeat lab work. Monitor renal function and potassium levels due to losartan  therapy. Encourage to monitor blood pressure at home.

## 2024-03-11 NOTE — Assessment & Plan Note (Signed)
 He has hyperlipidemia with no current lipid-lowering therapy and was previously supposed to be on atorvastatin . Order a lipid panel to assess current cholesterol levels.

## 2024-03-12 LAB — COMPREHENSIVE METABOLIC PANEL WITH GFR
ALT: 23 IU/L (ref 0–44)
AST: 14 IU/L (ref 0–40)
Albumin: 4.2 g/dL (ref 3.9–4.9)
Alkaline Phosphatase: 94 IU/L (ref 44–121)
BUN/Creatinine Ratio: 14 (ref 10–24)
BUN: 12 mg/dL (ref 8–27)
Bilirubin Total: 0.6 mg/dL (ref 0.0–1.2)
CO2: 22 mmol/L (ref 20–29)
Calcium: 9.1 mg/dL (ref 8.6–10.2)
Chloride: 99 mmol/L (ref 96–106)
Creatinine, Ser: 0.83 mg/dL (ref 0.76–1.27)
Globulin, Total: 2.3 g/dL (ref 1.5–4.5)
Glucose: 373 mg/dL — ABNORMAL HIGH (ref 70–99)
Potassium: 4.3 mmol/L (ref 3.5–5.2)
Sodium: 137 mmol/L (ref 134–144)
Total Protein: 6.5 g/dL (ref 6.0–8.5)
eGFR: 98 mL/min/{1.73_m2} (ref >59)

## 2024-03-12 LAB — CBC WITH DIFFERENTIAL/PLATELET
Basophils Absolute: 0 10*3/uL (ref 0.0–0.2)
Basos: 0 %
EOS (ABSOLUTE): 0 10*3/uL (ref 0.0–0.4)
Eos: 0 %
Hematocrit: 48.3 % (ref 37.5–51.0)
Hemoglobin: 15.9 g/dL (ref 13.0–17.7)
Immature Grans (Abs): 0 10*3/uL (ref 0.0–0.1)
Immature Granulocytes: 0 %
Lymphocytes Absolute: 1.2 10*3/uL (ref 0.7–3.1)
Lymphs: 24 %
MCH: 32.7 pg (ref 26.6–33.0)
MCHC: 32.9 g/dL (ref 31.5–35.7)
MCV: 99 fL — ABNORMAL HIGH (ref 79–97)
Monocytes Absolute: 0.3 10*3/uL (ref 0.1–0.9)
Monocytes: 6 %
Neutrophils Absolute: 3.5 10*3/uL (ref 1.4–7.0)
Neutrophils: 70 %
Platelets: 151 10*3/uL (ref 150–450)
RBC: 4.86 x10E6/uL (ref 4.14–5.80)
RDW: 13.3 % (ref 11.6–15.4)
WBC: 5 10*3/uL (ref 3.4–10.8)

## 2024-03-12 LAB — TSH: TSH: 3.3 u[IU]/mL (ref 0.450–4.500)

## 2024-03-12 LAB — LIPID PANEL
Chol/HDL Ratio: 6 ratio — ABNORMAL HIGH (ref 0.0–5.0)
Cholesterol, Total: 205 mg/dL — ABNORMAL HIGH (ref 100–199)
HDL: 34 mg/dL — ABNORMAL LOW (ref >39)
LDL Chol Calc (NIH): 125 mg/dL — ABNORMAL HIGH (ref 0–99)
Triglycerides: 260 mg/dL — ABNORMAL HIGH (ref 0–149)
VLDL Cholesterol Cal: 46 mg/dL — ABNORMAL HIGH (ref 5–40)

## 2024-03-12 LAB — PSA: Prostate Specific Ag, Serum: 1.3 ng/mL (ref 0.0–4.0)

## 2024-03-12 LAB — HEMOGLOBIN A1C
Est. average glucose Bld gHb Est-mCnc: 269 mg/dL
Hgb A1c MFr Bld: 11 % — ABNORMAL HIGH (ref 4.8–5.6)

## 2024-03-15 ENCOUNTER — Ambulatory Visit: Payer: Self-pay

## 2024-03-25 ENCOUNTER — Ambulatory Visit

## 2024-03-31 ENCOUNTER — Ambulatory Visit (INDEPENDENT_AMBULATORY_CARE_PROVIDER_SITE_OTHER)

## 2024-03-31 ENCOUNTER — Other Ambulatory Visit: Payer: Self-pay

## 2024-03-31 VITALS — BP 120/84 | HR 85

## 2024-03-31 DIAGNOSIS — R899 Unspecified abnormal finding in specimens from other organs, systems and tissues: Secondary | ICD-10-CM

## 2024-03-31 DIAGNOSIS — I10 Essential (primary) hypertension: Secondary | ICD-10-CM | POA: Diagnosis not present

## 2024-03-31 NOTE — Progress Notes (Signed)
 Patient here for nurse visit BP check per order from Bluford Burkitt, NP.   Patient reports compliance with prescribed BP medications: yes  Last dose of BP medication: Pt stated that he took his Losartan  50 mg this morning.   BP Readings from Last 3 Encounters:  03/31/24 120/84  03/11/24 (!) 142/90  12/04/21 (!) 133/97   Pulse Readings from Last 3 Encounters:  03/31/24 85  03/11/24 70  12/04/21 92    Pt was asked if he was having any symptoms and he stated that he was not. Pt was advised that he was good to go and if anything needed to be changed we would give him a call.   Patient verbalized understanding of instructions.   Dayne Dekay, CMA

## 2024-04-01 ENCOUNTER — Other Ambulatory Visit: Payer: Self-pay | Admitting: Nurse Practitioner

## 2024-04-01 ENCOUNTER — Ambulatory Visit: Payer: Self-pay | Admitting: Nurse Practitioner

## 2024-04-01 DIAGNOSIS — E1165 Type 2 diabetes mellitus with hyperglycemia: Secondary | ICD-10-CM

## 2024-04-01 LAB — BASIC METABOLIC PANEL WITH GFR
BUN: 18 mg/dL (ref 6–23)
CO2: 27 meq/L (ref 19–32)
Calcium: 9.6 mg/dL (ref 8.4–10.5)
Chloride: 99 meq/L (ref 96–112)
Creatinine, Ser: 0.91 mg/dL (ref 0.40–1.50)
GFR: 89.93 mL/min (ref >60.00)
Glucose, Bld: 412 mg/dL — ABNORMAL HIGH (ref 70–99)
Potassium: 4.1 meq/L (ref 3.5–5.1)
Sodium: 138 meq/L (ref 135–145)

## 2024-04-01 MED ORDER — TIRZEPATIDE 2.5 MG/0.5ML ~~LOC~~ SOAJ: 2.5000 mg | SUBCUTANEOUS | 0 refills | Status: DC

## 2024-04-01 MED ORDER — FREESTYLE LIBRE 3 PLUS SENSOR MISC: 5 refills | Status: AC

## 2024-04-01 MED ORDER — TIRZEPATIDE 5 MG/0.5ML ~~LOC~~ SOAJ: 5.0000 mg | SUBCUTANEOUS | 0 refills | Status: DC

## 2024-04-05 ENCOUNTER — Other Ambulatory Visit (HOSPITAL_COMMUNITY): Payer: Self-pay

## 2024-04-05 ENCOUNTER — Telehealth: Payer: Self-pay | Admitting: Pharmacy Technician

## 2024-04-05 NOTE — Telephone Encounter (Signed)
 Pharmacy Patient Advocate Encounter   Received notification from Onbase that prior authorization for FreeStyle Libre 3 Plus Sensor is required/requested.   Insurance verification completed.   The patient is insured through [employer] .   Per test claim: Patient must be treated with insulin  in order to qualify for this to be covered under his insurance. No current insulin  therapy on patient's current medication list. PA will not be submitted at this time as it would be denied.  Archived CMM Key: [id number] as not send to plan.

## 2024-04-08 ENCOUNTER — Other Ambulatory Visit: Payer: Self-pay | Admitting: Nurse Practitioner

## 2024-04-08 DIAGNOSIS — E1165 Type 2 diabetes mellitus with hyperglycemia: Secondary | ICD-10-CM

## 2024-04-08 MED ORDER — GLIPIZIDE 5 MG PO TABS: 5.0000 mg | ORAL_TABLET | Freq: Every day | ORAL | 1 refills | Status: DC

## 2024-04-08 MED ORDER — METFORMIN HCL ER 500 MG PO TB24: 500.0000 mg | ORAL_TABLET | Freq: Two times a day (BID) | ORAL | 1 refills | Status: AC

## 2024-04-20 ENCOUNTER — Telehealth: Payer: Self-pay | Admitting: *Deleted

## 2024-04-20 ENCOUNTER — Ambulatory Visit (INDEPENDENT_AMBULATORY_CARE_PROVIDER_SITE_OTHER): Admitting: *Deleted

## 2024-04-20 VITALS — Ht 69.0 in | Wt 215.0 lb

## 2024-04-20 DIAGNOSIS — Z1159 Encounter for screening for other viral diseases: Secondary | ICD-10-CM

## 2024-04-20 DIAGNOSIS — Z0000 Encounter for general adult medical examination without abnormal findings: Secondary | ICD-10-CM | POA: Diagnosis not present

## 2024-04-20 NOTE — Patient Instructions (Signed)
 Mr. Benally , Thank you for taking time out of your busy schedule to complete your Annual Wellness Visit with me. I enjoyed our conversation and look forward to speaking with you again next year. I, as well as your care team,  appreciate your ongoing commitment to your health goals. Please review the following plan we discussed and let me know if I can assist you in the future. Your Game plan/ To Do List    Referrals: If you haven't heard from the office you've been referred to, please reach out to them at the phone provided.  Remember to call and schedule an eye appointment. Follow up Visits: Next Medicare AWV with our clinical staff: 04/24/25 @ 1:40   Have you seen your provider in the last 6 months (3 months if uncontrolled diabetes)? Yes Next Office Visit with your provider: 06/10/24  Clinician Recommendations:  Aim for 30 minutes of exercise or brisk walking, 6-8 glasses of water, and 5 servings of fruits and vegetables each day.       This is a list of the screening recommended for you and due dates:  Health Maintenance  Topic Date Due   Hepatitis C Screening  Never done   Colon Cancer Screening  05/13/2013   Complete foot exam   02/02/2015   Eye exam for diabetics  07/11/2015   Yearly kidney health urinalysis for diabetes  09/08/2015   Flu Shot  06/03/2024   Hemoglobin A1C  09/11/2024   Yearly kidney function blood test for diabetes  03/31/2025   Medicare Annual Wellness Visit  04/20/2025   DTaP/Tdap/Td vaccine (6 - Td or Tdap) 02/20/2034   Pneumococcal Vaccination  Completed   COVID-19 Vaccine  Completed   HIV Screening  Completed   Zoster (Shingles) Vaccine  Completed   HPV Vaccine  Aged Out   Meningitis B Vaccine  Aged Out    Advanced directives: (Copy Requested) Please bring a copy of your health care power of attorney and living will to the office to be added to your chart at your convenience. You can mail to [facility] [address] [place], [place]  [address] or email to [email or web address] Advance Care Planning is important because it:  [x]  Makes sure you receive the medical care that is consistent with your values, goals, and preferences  [x]  It provides guidance to your family and loved ones and reduces their decisional burden about whether or not they are making the right decisions based on your wishes.

## 2024-04-20 NOTE — Progress Notes (Signed)
 Subjective:   Steven Holmes is a 63 y.o. who presents for a Medicare Wellness preventive visit.  As a reminder, Annual Wellness Visits don't include a physical exam, and some assessments may be limited, especially if this visit is performed virtually. We may recommend an in-person follow-up visit with your provider if needed.  Visit Complete: Virtual I connected with  Laverda Poster on 04/20/24 by a audio enabled telemedicine application and verified that I am speaking with the correct person using two identifiers.  Patient Location: Home  Provider Location: Home Office  I discussed the limitations of evaluation and management by telemedicine. The patient expressed understanding and agreed to proceed.  Vital Signs: Because this visit was a virtual/telehealth visit, some criteria may be missing or patient reported. Any vitals not documented were not able to be obtained and vitals that have been documented are patient reported.  VideoDeclined- This patient declined Librarian, academic. Therefore the visit was completed with audio only.  Persons Participating in Visit: Patient.  AWV Questionnaire: No: Patient Medicare AWV questionnaire was not completed prior to this visit.  Cardiac Risk Factors include: advanced age (>51men, >30 women);diabetes mellitus;male gender;obesity (BMI >30kg/m2);dyslipidemia;hypertension     Objective:    Today's Vitals   04/20/24 1025  Weight: 215 lb (97.5 kg)  Height: 5' 9 (1.753 m)  PainSc: 4    Body mass index is 31.75 kg/m.     04/20/2024   10:48 AM 12/03/2021   11:11 PM 10/15/2019    9:00 AM 10/14/2019    4:45 PM 10/08/2019   12:38 PM 03/29/2017    5:10 PM 03/22/2017   10:51 AM  Advanced Directives  Does Patient Have a Medical Advance Directive? Yes No No No No No  No   Type of Estate agent of [place];Living will        Copy of Healthcare Power of Attorney in Chart? No - copy  requested        Would patient like information on creating a medical advance directive?  No - Patient declined   No - Patient declined Yes (ED - Information included in AVS)  No - Patient declined      Data saved with a previous flowsheet row definition    Current Medications (verified) Outpatient Encounter Medications as of 04/20/2024  Medication Sig   Apoaequorin (PREVAGEN PO) Take by mouth daily.   atorvastatin  (LIPITOR) 40 MG tablet Take 40 mg by mouth daily.   CINNAMON PO Take by mouth 4 (four) times daily.   Continuous Glucose Sensor (FREESTYLE LIBRE 3 PLUS SENSOR) MISC Change sensor every 15 days.   losartan  (COZAAR ) 50 MG tablet Take 1 tablet (50 mg total) by mouth daily.   Multiple Vitamin (MULTIVITAMIN) tablet Take 1 tablet by mouth daily.   Probiotic Product (PROBIOTIC DAILY PO) Take by mouth daily.   vitamin C  (ASCORBIC ACID ) 500 MG tablet Take 500 mg by mouth 2 (two) times daily.   ELDERBERRY PO Take 1 tablet by mouth daily. (Patient not taking: Reported on 04/20/2024)   glipiZIDE  (GLUCOTROL ) 5 MG tablet Take 1 tablet (5 mg total) by mouth daily before breakfast. (Patient not taking: Reported on 04/20/2024)   metFORMIN  (GLUCOPHAGE -XR) 500 MG 24 hr tablet Take 1 tablet (500 mg total) by mouth 2 (two) times daily with a meal. (Patient not taking: Reported on 04/20/2024)   No facility-administered encounter medications on file as of 04/20/2024.    Allergies (verified) Lisinopril    History:  Past Medical History:  Diagnosis Date   Complex regional pain syndrome    CRPS   COVID-19    Diabetes mellitus without complication (HCC)    type 2   Diverticulosis    DJD (degenerative joint disease), lumbar    Hypertension    Past Surgical History:  Procedure Laterality Date   HERNIA REPAIR  2009   revision left sided inguinal at [facility]   VASECTOMY     Family History  Problem Relation Age of Onset   Cancer Mother        skin Ca   Cancer Father        basal cell , nose     Social History   Socioeconomic History   Marital status: Married    Spouse name: Not on file   Number of children: Not on file   Years of education: Not on file   Highest education level: Not on file  Occupational History   Not on file  Tobacco Use   Smoking status: Former    Current packs/day: 0.00    Average packs/day: 1 pack/day for 25.0 years (25.0 ttl pk-yrs)    Types: Cigarettes    Start date: 11/04/1975    Quit date: 11/03/2000    Years since quitting: 23.4   Smokeless tobacco: Never  Vaping Use   Vaping status: Never Used  Substance and Sexual Activity   Alcohol use: No   Drug use: No   Sexual activity: Not on file  Other Topics Concern   Not on file  Social History Narrative   Married   Social Drivers of Health   Financial Resource Strain: Low Risk  (04/20/2024)   Overall Financial Resource Strain (CARDIA)    Difficulty of Paying Living Expenses: Not hard at all  Food Insecurity: No Food Insecurity (04/20/2024)   Hunger Vital Sign    Worried About Running Out of Food in the Last Year: Never true    Ran Out of Food in the Last Year: Never true  Transportation Needs: No Transportation Needs (04/20/2024)   PRAPARE - Administrator, Civil Service (Medical): No    Lack of Transportation (Non-Medical): No  Physical Activity: Inactive (04/20/2024)   Exercise Vital Sign    Days of Exercise per Week: 0 days    Minutes of Exercise per Session: 0 min  Stress: No Stress Concern Present (04/20/2024)   [employer] of Occupational Health - Occupational Stress Questionnaire    Feeling of Stress: Only a little  Social Connections: Moderately Isolated (04/20/2024)   Social Connection and Isolation Panel    Frequency of Communication with Friends and Family: More than three times a week    Frequency of Social Gatherings with Friends and Family: More than three times a week    Attends Religious Services: Never    Database administrator or Organizations: No     Attends Engineer, structural: Never    Marital Status: Married    Tobacco Counseling Counseling given: Not Answered    Clinical Intake:  Pre-visit preparation completed: Yes  Pain : 0-10 Pain Score: 4  Pain Type: Chronic pain Pain Location: Back Pain Orientation: Lower Pain Descriptors / Indicators: Aching Pain Onset: More than a month ago Pain Frequency: Constant     BMI - recorded: 31.75 Nutritional Status: BMI > 30  Obese Nutritional Risks: None Diabetes: No  Lab Results  Component Value Date   HGBA1C 11.0 (H) 03/11/2024   HGBA1C 9.3 (H)  10/15/2019   HGBA1C 5.7 02/13/2015     How often do you need to have someone help you when you read instructions, pamphlets, or other written materials from your doctor or pharmacy?: 1 - Never  Interpreter Needed?: No  Information entered by :: R. [place] Lesure LPN   Activities of Daily Living     04/20/2024   10:28 AM  In your present state of health, do you have any difficulty performing the following activities:  Hearing? 0  Vision? 0  Difficulty concentrating or making decisions? 1  Walking or climbing stairs? 1  Dressing or bathing? 0  Doing errands, shopping? 0  Preparing Food and eating ? N  Using the Toilet? N  In the past six months, have you accidently leaked urine? Y  Do you have problems with loss of bowel control? N  Managing your Medications? N  Managing your Finances? N  Housekeeping or managing your Housekeeping? N    Patient Care Team: Bluford Burkitt, NP as PCP - General (Nurse Practitioner)  I have updated your Care Teams any recent Medical Services you may have received from other providers in the past year.     Assessment:   This is a routine wellness examination for [place].  Hearing/Vision screen Hearing Screening - Comments:: No issues Vision Screening - Comments:: glasses   Goals Addressed             This Visit's Progress    Patient Stated       Wants to walk more and do some  hiking       Depression Screen     04/20/2024   10:42 AM 03/11/2024    2:24 PM  PHQ 2/9 Scores  PHQ - 2 Score 1 2  PHQ- 9 Score 3 5    Fall Risk     04/20/2024   10:31 AM 03/11/2024    2:24 PM  Fall Risk   Falls in the past year? 1 1  Number falls in past yr: 0 0  Injury with Fall? 0 0  Comment some bruises   Risk for fall due to : History of fall(s);Impaired balance/gait No Fall Risks  Follow up Falls evaluation completed;Falls prevention discussed Falls evaluation completed    MEDICARE RISK AT HOME:  Medicare Risk at Home Any stairs in or around the home?: Yes If so, are there any without handrails?: Yes Home free of loose throw rugs in walkways, pet beds, electrical cords, etc?: Yes Adequate lighting in your home to reduce risk of falls?: Yes Life alert?: No Use of a cane, walker or w/c?: No Grab bars in the bathroom?: Yes Shower chair or bench in shower?: Yes Elevated toilet seat or a handicapped toilet?: No  TIMED UP AND GO:  Was the test performed?  No  Cognitive Function: 6CIT completed        04/20/2024   10:48 AM  6CIT Screen  What Year? 0 points  What month? 0 points  What time? 0 points  Count back from 20 0 points  Months in reverse 0 points  Repeat phrase 0 points  Total Score 0 points    Immunizations Immunization History  Administered Date(s) Administered   DTaP 01/12/2012   Influenza Split 07/17/2012   Influenza,inj,Quad PF,6+ Mos 08/15/2014   Pfizer(Comirnaty)Fall Seasonal Vaccine 12 years and older 08/26/2023   Tdap 08/15/2012    Screening Tests Health Maintenance  Topic Date Due   Medicare Annual Wellness (AWV)  Never done   Hepatitis C  Screening  Never done   Pneumococcal Vaccine 52-64 Years old (1 of 2 - PCV) Never done   Colonoscopy  05/13/2013   FOOT EXAM  02/02/2015   OPHTHALMOLOGY EXAM  07/11/2015   Diabetic kidney evaluation - Urine ACR  09/08/2015   DTaP/Tdap/Td (3 - Td or Tdap) 08/15/2022   INFLUENZA VACCINE   06/03/2024   HEMOGLOBIN A1C  09/11/2024   Diabetic kidney evaluation - eGFR measurement  03/31/2025   COVID-19 Vaccine  Completed   HIV Screening  Completed   Zoster Vaccines- Shingrix  Completed   HPV VACCINES  Aged Out   Meningococcal B Vaccine  Aged Out    Health Maintenance  Health Maintenance Due  Topic Date Due   Medicare Annual Wellness (AWV)  Never done   Hepatitis C Screening  Never done   Pneumococcal Vaccine 23-39 Years old (1 of 2 - PCV) Never done   Colonoscopy  05/13/2013   FOOT EXAM  02/02/2015   OPHTHALMOLOGY EXAM  07/11/2015   Diabetic kidney evaluation - Urine ACR  09/08/2015   DTaP/Tdap/Td (3 - Td or Tdap) 08/15/2022   Health Maintenance Items Addressed: Labs Ordered:  , Hepatitis C screening ordered/.  Patient stated that he is sure that he  had a colonoscopy at [facility] in the last few years. Patient stated that he will get the records and bring to his next visit. Will request records also.  Additional Screening:  Vision Screening: Recommended annual ophthalmology exams for early detection of glaucoma and other disorders of the eye.Markel Silber  Patient will call and schedule an appointment Would you like a referral to an eye doctor? No    Dental Screening: Recommended annual dental exams for proper oral hygiene  Community Resource Referral / Chronic Care Management: CRR required this visit?  No   CCM required this visit?  No   Plan:    I have personally reviewed and noted the following in the patient's chart:   Medical and social history Use of alcohol, tobacco or illicit drugs  Current medications and supplements including opioid prescriptions. Patient is not currently taking opioid prescriptions. Functional ability and status Nutritional status Physical activity Advanced directives List of other physicians Hospitalizations, surgeries, and ER visits in previous 12 months Vitals Screenings to include cognitive, depression, and  falls Referrals and appointments  In addition, I have reviewed and discussed with patient certain preventive protocols, quality metrics, and best practice recommendations. A written personalized care plan for preventive services as well as general preventive health recommendations were provided to patient.   Felicitas Horse, LPN   1/61/0960   After Visit Summary: (MyChart) Due to this being a telephonic visit, the after visit summary with patients personalized plan was offered to patient via MyChart   Notes: Nothing significant to report at this time.  Phone note sent to PCP

## 2024-04-20 NOTE — Telephone Encounter (Addendum)
 Performed AWV. While reviewing patient's medications was advised that he stopped taking his Glipizide  October 2024 and has not taken Metformin  in 4-5 years. Patient's medication list was updated which  there were several added. Hepatitis C screening lab test ordered to have done at next visit.  Patient stated that he thinks that he had a colonoscopy at [facility] in the last few years and will try to get that paperwork and bring to his next visit. Sent request to [facility] for colonoscopy records. Patient needs a diabetic foot exam documented at next visit Patient requested a call back as to what he should do about the medications that he has stopped. Pharmacy [facility] [address]

## 2024-06-02 ENCOUNTER — Telehealth: Payer: Self-pay

## 2024-06-02 NOTE — Telephone Encounter (Signed)
 Copied from CRM [id number]. Topic: General - Other >> Jun 02, 2024 12:17 PM Thersia C wrote: Reason for CRM: Patient called in stated he was told to get all paperwork from the [facility] , [facility] not [facility] and it will be a lot of paper to generate so wanted to know what exact papers is needed so they can be printed out can call the patient back regarding this information

## 2024-06-03 NOTE — Telephone Encounter (Signed)
 Tried to call patient back left message on voicemail to call the office back.  Please let patient know when he calls back he had stated during his AWV he thought that he had a colonoscopy at the [facility] in the last few years. Patient said that he had an upcoming appointment scheduled at the [facility] and would see about getting a copy of that report and would bring to the office at his next visit so that his records could be updated,

## 2024-06-10 ENCOUNTER — Ambulatory Visit: Admitting: Nurse Practitioner

## 2024-06-15 ENCOUNTER — Ambulatory Visit: Admitting: Nurse Practitioner

## 2024-07-26 ENCOUNTER — Ambulatory Visit: Admitting: Nurse Practitioner

## 2024-11-01 ENCOUNTER — Other Ambulatory Visit: Payer: Self-pay | Admitting: Nurse Practitioner

## 2024-11-01 ENCOUNTER — Telehealth: Payer: Self-pay

## 2024-11-01 DIAGNOSIS — E1165 Type 2 diabetes mellitus with hyperglycemia: Secondary | ICD-10-CM

## 2024-11-01 NOTE — Telephone Encounter (Signed)
 Message sent to admin pool to have pt scheduled a sooner appt

## 2024-11-01 NOTE — Telephone Encounter (Signed)
 He needs an appt. I haven't seen him since May and his next appt isnt until June.

## 2024-11-02 NOTE — Telephone Encounter (Signed)
 Noted I also left a VM for pt to CB to schedule sooner

## 2025-04-24 ENCOUNTER — Ambulatory Visit
